# Patient Record
Sex: Female | Born: 1945
Health system: Southern US, Community
[De-identification: ages and names within clinical notes are randomized; demographics above are authoritative.]

## PROBLEM LIST (undated history)

## (undated) ENCOUNTER — Ambulatory Visit: Source: Home / Self Care

## (undated) DIAGNOSIS — I10 Essential (primary) hypertension: Secondary | ICD-10-CM

## (undated) DIAGNOSIS — E119 Type 2 diabetes mellitus without complications: Secondary | ICD-10-CM

## (undated) HISTORY — PX: ABDOMINAL HYSTERECTOMY: SHX81

## (undated) HISTORY — DX: Essential (primary) hypertension: I10

## (undated) HISTORY — DX: Type 2 diabetes mellitus without complications: E11.9

---

## 1998-04-12 ENCOUNTER — Other Ambulatory Visit: Admission: RE | Admit: 1998-04-12 | Discharge: 1998-04-12 | Payer: Self-pay | Admitting: Obstetrics & Gynecology

## 1999-08-19 ENCOUNTER — Other Ambulatory Visit: Admission: RE | Admit: 1999-08-19 | Discharge: 1999-08-19 | Payer: Self-pay | Admitting: Obstetrics & Gynecology

## 2000-11-05 ENCOUNTER — Other Ambulatory Visit: Admission: RE | Admit: 2000-11-05 | Discharge: 2000-11-05 | Payer: Self-pay | Admitting: Obstetrics & Gynecology

## 2001-12-21 ENCOUNTER — Other Ambulatory Visit: Admission: RE | Admit: 2001-12-21 | Discharge: 2001-12-21 | Payer: Self-pay | Admitting: Obstetrics & Gynecology

## 2003-01-09 ENCOUNTER — Other Ambulatory Visit: Admission: RE | Admit: 2003-01-09 | Discharge: 2003-01-09 | Payer: Self-pay | Admitting: Obstetrics & Gynecology

## 2005-03-17 ENCOUNTER — Encounter: Admission: RE | Admit: 2005-03-17 | Discharge: 2005-03-17 | Payer: Self-pay | Admitting: Family Medicine

## 2005-04-21 ENCOUNTER — Encounter: Admission: RE | Admit: 2005-04-21 | Discharge: 2005-07-20 | Payer: Self-pay | Admitting: Family Medicine

## 2010-06-18 ENCOUNTER — Encounter: Admission: RE | Admit: 2010-06-18 | Discharge: 2010-06-18 | Payer: Self-pay | Admitting: Obstetrics & Gynecology

## 2010-12-11 ENCOUNTER — Other Ambulatory Visit: Payer: Self-pay | Admitting: Obstetrics & Gynecology

## 2010-12-11 DIAGNOSIS — Z09 Encounter for follow-up examination after completed treatment for conditions other than malignant neoplasm: Secondary | ICD-10-CM

## 2010-12-16 ENCOUNTER — Ambulatory Visit
Admission: RE | Admit: 2010-12-16 | Discharge: 2010-12-16 | Disposition: A | Discharge status: Home / Self Care | Payer: BC Managed Care – PPO | Source: Ambulatory Visit | Attending: Obstetrics & Gynecology | Admitting: Obstetrics & Gynecology

## 2010-12-16 ENCOUNTER — Other Ambulatory Visit: Payer: Self-pay | Admitting: Obstetrics & Gynecology

## 2010-12-16 DIAGNOSIS — Z09 Encounter for follow-up examination after completed treatment for conditions other than malignant neoplasm: Secondary | ICD-10-CM

## 2011-06-24 ENCOUNTER — Other Ambulatory Visit: Payer: Self-pay | Admitting: Obstetrics & Gynecology

## 2011-06-24 DIAGNOSIS — N6489 Other specified disorders of breast: Secondary | ICD-10-CM

## 2011-07-08 ENCOUNTER — Ambulatory Visit
Admission: RE | Admit: 2011-07-08 | Discharge: 2011-07-08 | Disposition: A | Discharge status: Home / Self Care | Payer: BC Managed Care – PPO | Source: Ambulatory Visit | Attending: Obstetrics & Gynecology | Admitting: Obstetrics & Gynecology

## 2011-07-08 DIAGNOSIS — N6489 Other specified disorders of breast: Secondary | ICD-10-CM

## 2013-01-04 ENCOUNTER — Other Ambulatory Visit: Payer: Self-pay | Admitting: Family Medicine

## 2013-01-04 DIAGNOSIS — M25562 Pain in left knee: Secondary | ICD-10-CM

## 2013-01-04 DIAGNOSIS — R609 Edema, unspecified: Secondary | ICD-10-CM

## 2013-01-10 ENCOUNTER — Ambulatory Visit
Admission: RE | Admit: 2013-01-10 | Discharge: 2013-01-10 | Disposition: A | Discharge status: Home / Self Care | Payer: 59 | Source: Ambulatory Visit | Attending: Family Medicine | Admitting: Family Medicine

## 2013-01-10 DIAGNOSIS — M25562 Pain in left knee: Secondary | ICD-10-CM

## 2013-01-10 DIAGNOSIS — R609 Edema, unspecified: Secondary | ICD-10-CM

## 2013-04-26 HISTORY — PX: OTHER SURGICAL HISTORY: SHX169

## 2014-08-21 ENCOUNTER — Ambulatory Visit: Payer: Self-pay | Admitting: *Deleted

## 2014-09-11 ENCOUNTER — Encounter: Payer: Self-pay | Admitting: Dietician

## 2014-09-11 ENCOUNTER — Encounter: Payer: Medicare Other | Attending: Endocrinology | Admitting: Dietician

## 2014-09-11 VITALS — Ht 62.0 in | Wt 160.0 lb

## 2014-09-11 DIAGNOSIS — E1069 Type 1 diabetes mellitus with other specified complication: Secondary | ICD-10-CM | POA: Insufficient documentation

## 2014-09-11 DIAGNOSIS — Z713 Dietary counseling and surveillance: Secondary | ICD-10-CM | POA: Diagnosis not present

## 2014-09-11 DIAGNOSIS — E108 Type 1 diabetes mellitus with unspecified complications: Secondary | ICD-10-CM

## 2014-09-11 DIAGNOSIS — E1065 Type 1 diabetes mellitus with hyperglycemia: Secondary | ICD-10-CM

## 2014-09-11 DIAGNOSIS — IMO0002 Reserved for concepts with insufficient information to code with codable children: Secondary | ICD-10-CM

## 2014-09-11 NOTE — Patient Instructions (Signed)
Great job with changing your beverages! Increase exercise as tolerated. 6 x per week or 40 minutes each time.   Be mindful of choices at restaurant.  Be mindful of portions of carbohydrates (starches). Consider reading labels especially cereal.   Aim for 3 Carb Choices per meal (45 grams) +/- 1 either way  Aim for 0-1 Carbs per snack if hungry

## 2014-09-11 NOTE — Progress Notes (Signed)
  Medical Nutrition Therapy:  Appt start time: 1100 end time:  1200.   Assessment:  Primary concerns today: Patient would like to improve blood sugar control and lose weight.  States that she does not want to take insulin..  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  Patient does the shopping and cooking.  Weight overall has been stable for the past 10 years.    Tanida Scale:  41.8% body fat  Preferred Learning Style:   No preference indicated   Learning Readiness:   Ready  Change in progress   MEDICATIONS: see list, includes Amaryl and Glucophage   DIETARY INTAKE:  24-hr recall:  B ( AM): cereal (frosted flakes) and milk, 4 oz juice or boiled egg and fruit Snk ( AM): none L ( PM): out to eat with husband salad, salmon, soup Snk ( PM): cheese or crackers D ( PM): Out to eat 3-4 times per week. vege beef soup, crackers Snk ( PM): crackers Beverages: 2-3 coke per day was regular and changed to diet at the first of the year, juice, water, coffee with splenda  Usual physical activity: 30 minutes per day on bike and dance class for an hour once per week.  She increase this at the beginning of the year to help with blood sugar control.    Estimated energy needs: 1400 calories 158 g carbohydrates 105 g protein 39 g fat  Progress Towards Goal(s):  In progress.   Nutritional Diagnosis:  NB-1.1 Food and nutrition-related knowledge deficit As related to balance of carbohydrate, protein, and fat.  As evidenced by patient reprot..    Intervention:  Nutrition counseling and diabetes education initiated. Discussed Carb Counting by food group as method of portion control, reading food labels, and benefits of increased activity. Also discussed basic phy target BG ranges pre and post meals, and A1c.  Doristine Devoid job with changing your beverages! Increase exercise as tolerated. 6 x per week or 40 minutes each time.   Be mindful of choices at restaurant.  Be mindful of  portions of carbohydrates (starches). Consider reading labels especially cereal.   Aim for 3 Carb Choices per meal (45 grams) +/- 1 either way  Aim for 0-1 Carbs per snack if hungry    Teaching Method Utilized:  Visual Auditory Hands on  Handouts given during visit include: Food Label handouts Meal Plan Card My Plate HgbA1C sheet   Barriers to learning/adherence to lifestyle change: none  Demonstrated degree of understanding via:  Teach Back   Monitoring/Evaluation:  Dietary intake, exercise, label reading, and body weight in 6 week(s).

## 2014-10-30 ENCOUNTER — Encounter: Payer: Medicare Other | Attending: Endocrinology | Admitting: Dietician

## 2014-10-30 DIAGNOSIS — Z713 Dietary counseling and surveillance: Secondary | ICD-10-CM | POA: Insufficient documentation

## 2014-10-30 DIAGNOSIS — E1069 Type 1 diabetes mellitus with other specified complication: Secondary | ICD-10-CM | POA: Diagnosis present

## 2014-10-30 DIAGNOSIS — E119 Type 2 diabetes mellitus without complications: Secondary | ICD-10-CM

## 2014-10-30 NOTE — Progress Notes (Signed)
  Medical Nutrition Therapy:  Appt start time: 9179 end time:  1030.   Assessment:  09/11/14 Primary concerns today: Patient would like to improve blood sugar control and lose weight.  States that she does not want to take insulin..  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  Patient does the shopping and cooking.  Weight overall has been stable for the past 10 years.     TANITA  BODY COMP RESULTS 09/16/14 157.5 lbs   BMI (kg/m^2) 28.8   Fat Mass (lbs) 66   Fat Free Mass (lbs) 91.5   Total Body Water (lbs) 67     Assessment 10/30/14 Patient reports that she has decreased the sugar in her diet.  Rarely eats frosted flakes and has decreased the portion.  Has consistently used stationary bike for 40 minutes 6 days per week.  States that she needs further help with reducing portion size.  Weight is stable at 160 lbs but clothes fit better and others are telling patient and friends are asking if she has lost weight.  She is splitting restaurant portions with husband and requesting less when she orders.  CBG before bed (about 2 hours after dinner) is 140-159.  States that this has reduced from >200.  HgbA1C about 9% per patient.   Preferred Learning Style:   No preference indicated   Learning Readiness:   Ready  Change in progress   MEDICATIONS: see list, includes Amaryl and Glucophage   DIETARY INTAKE:  24-hr recall:  B ( AM): toast with coffee or boiled egg, grits, and toast, may have fruit Snk ( AM): none L ( PM): out to eat with husband salad, salmon, soup Snk ( PM): cheese or crackers D ( PM): Out to eat 3-4 times per week. vege beef soup, crackers Snk ( PM): fruit Beverages: diet coke but dislikes, water, coffee with splenda  Usual physical activity: 40 minutes per day on bike and dance class for an hour once per week.  She increased biking to 40 minutes and 6 days per week since last appointment.  Estimated energy needs: 1400 calories 158 g  carbohydrates 105 g protein 39 g fat  Progress Towards Goal(s):  In progress.   Nutritional Diagnosis:  NB-1.1 Food and nutrition-related knowledge deficit As related to balance of carbohydrate, protein, and fat.  As evidenced by patient reprot..    Intervention:  Nutrition counseling and diabetes education review.  Discussed importance of breakfast and need for protein with each snack and meal. Spoke further about eating out and portion control.    Great job on increasing your exercise and the changes to your beverages and food choices! Continue to exercise 6 x per week for 40 minutes. Continue to be mindful of choices and portion sizes at restaurant.   Have a protein with each meal and snack. Breakfast is important daily.  (Something small with protein and carbohydrate is fine.)  Aim for 3 Carb Choices per meal (45 grams) +/- 1 either way  Aim for 0-1 Carbs per snack if hungry    Teaching Method Utilized:  Visual Auditory Hands on  Handouts given during visit include: Meal Plan Card Snack sheet   Barriers to learning/adherence to lifestyle change: none  Demonstrated degree of understanding via:  Teach Back   Monitoring/Evaluation:  Dietary intake, exercise, label reading, and body weight 7-8 weeks.

## 2014-10-30 NOTE — Patient Instructions (Addendum)
Great job on increasing your exercise and the changes to your beverages and food choices! Continue to exercise 6 x per week for 40 minutes. Continue to be mindful of choices and portion sizes at restaurant.   Have a protein with each meal and snack. Breakfast is important daily.  (Something small with protein and carbohydrate is fine.)

## 2014-12-11 ENCOUNTER — Ambulatory Visit: Payer: Medicare Other | Admitting: Dietician

## 2015-01-15 ENCOUNTER — Encounter: Payer: Medicare Other | Attending: Endocrinology | Admitting: Dietician

## 2015-01-15 ENCOUNTER — Encounter: Payer: Self-pay | Admitting: Dietician

## 2015-01-15 DIAGNOSIS — Z713 Dietary counseling and surveillance: Secondary | ICD-10-CM | POA: Diagnosis not present

## 2015-01-15 DIAGNOSIS — E1069 Type 1 diabetes mellitus with other specified complication: Secondary | ICD-10-CM | POA: Insufficient documentation

## 2015-01-15 DIAGNOSIS — E119 Type 2 diabetes mellitus without complications: Secondary | ICD-10-CM

## 2015-01-15 NOTE — Progress Notes (Signed)
Medical Nutrition Therapy:  Appt start time: 2671 end time:  1030.   Assessment:  09/11/14 Primary concerns today: Patient would like to improve blood sugar control and lose weight.  States that she does not want to take insulin..  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  Patient does the shopping and cooking.  Weight overall has been stable for the past 10 years.     TANITA  BODY COMP RESULTS 09/16/14 157.5 lbs   BMI (kg/m^2) 28.8   Fat Mass (lbs) 66   Fat Free Mass (lbs) 91.5   Total Body Water (lbs) 67     Assessment 10/30/14 Patient reports that she has decreased the sugar in her diet.  Rarely eats frosted flakes and has decreased the portion.  Has consistently used stationary bike for 40 minutes 6 days per week.  States that she needs further help with reducing portion size.  Weight is stable at 160 lbs but clothes fit better and others are telling patient and friends are asking if she has lost weight.  She is splitting restaurant portions with husband and requesting less when she orders.  CBG before bed (about 2 hours after dinner) is 140-159.  States that this has reduced from >200.  HgbA1C about 9% per patient.   01/15/15: Patient reports that she has been traveling a lot and eating more as well as not exercising as consistently.  Body composition results are the same as February but per patient her HgbA1C has decreased from 9% to 7%.    Preferred Learning Style:   No preference indicated   Learning Readiness:   Ready  Change in progress   MEDICATIONS: see list, includes Amaryl and Glucophage   DIETARY INTAKE:  24-hr recall:  B ( AM): toast with coffee or boiled egg, grits, and toast, may have fruit Snk ( AM): none L ( PM): out to eat with husband salad, salmon, soup Snk ( PM): cheese or crackers D ( PM): Out to eat 3-4 times per week. vege beef soup, crackers Snk ( PM): fruit Beverages: diet coke but dislikes, water, coffee with splenda  Usual  physical activity: 40 minutes per day on bike and dance class for an hour once per week.  Less consistent since her last visit.  Estimated energy needs: 1400 calories 158 g carbohydrates 105 g protein 39 g fat  Progress Towards Goal(s):  In progress.   Nutritional Diagnosis:  NB-1.1 Food and nutrition-related knowledge deficit As related to balance of carbohydrate, protein, and fat.  As evidenced by patient reprot..    Intervention:  Nutrition counseling and diabetes education review.  Discussed importance of breakfast and need for protein with each snack and meal. Spoke further about eating out and portion control.  Discussed importance of mindfulness with eating.  Book Intuitive Eating discussed as book for patient to read.  Motivation discussed.  -To increase exercise while traveling:  Plan trip to a museum or sight see.   Great job on increasing your exercise and the changes to your beverages and food choices! Continue to exercise 6 x per week for 40 minutes. Continue to be mindful of choices and portion sizes at restaurant.   Have a protein with each meal and snack. Breakfast is important daily.  (Something small with protein and carbohydrate is fine.)  Aim for 3 Carb Choices per meal (45 grams) +/- 1 either way  Aim for 0-1 Carbs per snack if hungry   Calorie [employer] Pace app or  look on line to be informed about what you are choosing when you are eating out. Continue to be mindful of food choices and hunger signals.   Protein with each meal.  Teaching Method Utilized:  Visual Auditory Hands on  Handouts given during visit include: Meal Plan Card Snack sheet   Barriers to learning/adherence to lifestyle change: none  Demonstrated degree of understanding via:  Teach Back   Monitoring/Evaluation:  Dietary intake, exercise, label reading, and body weight 7-8 weeks.

## 2015-01-15 NOTE — Patient Instructions (Signed)
Calorie [employer] app or look on line to be informed about what you are choosing when you are eating out. Continue to be mindful of food choices and hunger signals.   Protein with each meal.

## 2015-04-16 ENCOUNTER — Encounter: Payer: Self-pay | Admitting: Dietician

## 2015-04-16 ENCOUNTER — Encounter: Payer: Medicare Other | Attending: Obstetrics & Gynecology | Admitting: Dietician

## 2015-04-16 VITALS — Wt 159.0 lb

## 2015-04-16 DIAGNOSIS — E119 Type 2 diabetes mellitus without complications: Secondary | ICD-10-CM | POA: Diagnosis not present

## 2015-04-16 DIAGNOSIS — Z713 Dietary counseling and surveillance: Secondary | ICD-10-CM | POA: Insufficient documentation

## 2015-04-16 NOTE — Patient Instructions (Signed)
Consider putting Calorie [employer] Pace on your phone/i-pad to help make informed decisions when eating out.   Get back into your exercise habit.  40 minutes most days of the week.   Pay attention to your hunger and fullness. Whole grains are preferred Sugar free beverages only.  Do not drink carbohydrates.

## 2015-04-16 NOTE — Progress Notes (Signed)
Medical Nutrition Therapy:  Appt start time: 1030 end time:  1100.  Assessment:  09/11/14 Primary concerns today: Patient would like to improve blood sugar control and lose weight.  States that she does not want to take insulin..  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  Patient does the shopping and cooking.  Weight overall has been stable for the past 10 years.    TANITA  BODY COMP RESULTS 09/16/14 157.5 lbs   BMI (kg/m^2) 28.8   Fat Mass (lbs) 66   Fat Free Mass (lbs) 91.5   Total Body Water (lbs) 67    Assessment 10/30/14: Patient reports that she has decreased the sugar in her diet.  Rarely eats frosted flakes and has decreased the portion.  Has consistently used stationary bike for 40 minutes 6 days per week.  States that she needs further help with reducing portion size.  Weight is stable at 160 lbs but clothes fit better and others are telling patient and friends are asking if she has lost weight.  She is splitting restaurant portions with husband and requesting less when she orders.  CBG before bed (about 2 hours after dinner) is 140-159.  States that this has reduced from >200.  HgbA1C about 9% per patient.  01/15/15: Patient reports that she has been traveling a lot and eating more as well as not exercising as consistently.  Body composition results are the same as February but per patient her HgbA1C has decreased from 9% to 7%.    TANITA  BODY COMP RESULTS 01/15/15 157.5   BMI (kg/m^2) 28.8   Fat Mass (lbs) 66   Fat Free Mass (lbs) 91.5   Total Body Water (lbs) 67   04/16/15: Patient is here alone for follow up for weight loss.  She states that she has been traveling a lot and not exercising.  She has gone back to the sweet tea and regular coke often.  Fat mass has increased.  TANITA  BODY COMP RESULTS 04/16/15 159 lbs   BMI (kg/m^2) 29.1   Fat Mass (lbs) 70   Fat Free Mass (lbs) 89   Total Body Water (lbs) 65   Preferred Learning Style:   No preference  indicated   Learning Readiness:   Ready  Change in progress  MEDICATIONS: see list, includes Amaryl and Glucophage   DIETARY INTAKE:  24-hr recall:  B ( AM): toast with coffee or boiled egg, grits, and toast, may have fruit- skipped this am.  May have 1 slice bacon. Snk ( AM): none L ( PM): out to eat with husband salad, salmon, soup OR fruit and pork chop sandwich on white and fruit Snk ( PM): cheese or crackers or pretzels D ( PM): Out to eat 3-4 times per week. vege beef soup, crackers Snk ( PM): fruit Beverages: regular or diet coke, sweet tea, dislikes, water, coffee with splenda  Usual physical activity: 40 minutes per day on bike and dance class for an hour once per week.  None since last visit.  Estimated energy needs: 1400 calories 158 g carbohydrates 105 g protein 39 g fat  Progress Towards Goal(s):  In progress.   Nutritional Diagnosis:  NB-1.1 Food and nutrition-related knowledge deficit As related to balance of carbohydrate, protein, and fat.  As evidenced by patient reprot..    Intervention:  Nutrition counseling and diabetes education review.  Discussed importance of breakfast and need for protein with each snack and meal. Spoke further about eating out  and portion control.  Discussed importance of mindfulness with eating.  Discussed importance of getting back on track for Diabetes control.   Consider putting Calorie [employer] Pace on your phone/i-pad to help make informed decisions when eating out.   Get back into your exercise habit.  40 minutes most days of the week.   Pay attention to your hunger and fullness. Whole grains are preferred Sugar free beverages only.  Do not drink carbohydrates.  Aim for 3 Carb Choices per meal (45 grams) +/- 1 either way  Aim for 0-1 Carbs per snack if hungry   Teaching Method Utilized:  Visual Auditory  Handouts given during visit include: Dining out with diabetes Tips for healthier fast food choices. Snack  sheet   Barriers to learning/adherence to lifestyle change: none  Demonstrated degree of understanding via:  Teach Back   Monitoring/Evaluation:  Dietary intake, exercise, label reading, and body weight 7-8 weeks.

## 2015-05-28 ENCOUNTER — Ambulatory Visit: Payer: Medicare Other | Admitting: Dietician

## 2015-06-17 ENCOUNTER — Ambulatory Visit: Payer: Medicare Other | Admitting: Dietician

## 2015-07-01 ENCOUNTER — Encounter: Payer: Medicare Other | Attending: Obstetrics & Gynecology | Admitting: Dietician

## 2015-07-01 DIAGNOSIS — Z713 Dietary counseling and surveillance: Secondary | ICD-10-CM | POA: Diagnosis not present

## 2015-07-01 DIAGNOSIS — E119 Type 2 diabetes mellitus without complications: Secondary | ICD-10-CM | POA: Diagnosis present

## 2015-07-01 NOTE — Progress Notes (Signed)
Medical Nutrition Therapy:  Appt start time: 1030 end time:  1100.  Assessment:  09/11/14 Primary concerns today: Patient would like to improve blood sugar control and lose weight.  States that she does not want to take insulin..  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  Patient does the shopping and cooking.  Weight overall has been stable for the past 10 years.    TANITA  BODY COMP RESULTS 09/16/14 157.5 lbs   BMI (kg/m^2) 28.8   Fat Mass (lbs) 66   Fat Free Mass (lbs) 91.5   Total Body Water (lbs) 67    Assessment 10/30/14: Patient reports that she has decreased the sugar in her diet.  Rarely eats frosted flakes and has decreased the portion.  Has consistently used stationary bike for 40 minutes 6 days per week.  States that she needs further help with reducing portion size.  Weight is stable at 160 lbs but clothes fit better and others are telling patient and friends are asking if she has lost weight.  She is splitting restaurant portions with husband and requesting less when she orders.  CBG before bed (about 2 hours after dinner) is 140-159.  States that this has reduced from >200.  HgbA1C about 9% per patient.  01/15/15: Patient reports that she has been traveling a lot and eating more as well as not exercising as consistently.  Body composition results are the same as February but per patient her HgbA1C has decreased from 9% to 7%.    TANITA  BODY COMP RESULTS 01/15/15 157.5   BMI (kg/m^2) 28.8   Fat Mass (lbs) 66   Fat Free Mass (lbs) 91.5   Total Body Water (lbs) 67   04/16/15: Patient is here alone for follow up for weight loss.  She states that she has been traveling a lot and not exercising.  She has gone back to the sweet tea and regular coke often.  Fat mass has increased.  TANITA  BODY COMP RESULTS 04/16/15 159 lbs   BMI (kg/m^2) 29.1   Fat Mass (lbs) 70   Fat Free Mass (lbs) 89   Total Body Water (lbs) 65   07/01/15: Patient is here alone for follow up  for weight loss.  She is not feeling well today and states that her fasting blood sugar is 130-160.  She has been exercising inconsistently.  She is mindful of her eating habits at times.  Overall weight is stable but fat mass has decreased 2.5 lbs since last visit.    TANITA  BODY COMP RESULTS 07/01/15 159.5   BMI (kg/m^2) 29.2   Fat Mass (lbs) 67.5   Fat Free Mass (lbs) 92   Total Body Water (lbs) 67.5    Preferred Learning Style:   No preference indicated   Learning Readiness:   Ready  Change in progress  MEDICATIONS: see list, includes Amaryl and Glucophage   DIETARY INTAKE:  24-hr recall:  B ( AM): toast with coffee or boiled egg, grits, and toast, may have fruit- skipped this am.  May have 1 slice bacon. Snk ( AM): none L ( PM): out to eat with husband salad, salmon, soup OR fruit and pork chop sandwich on white and fruit Snk ( PM): cheese or crackers or pretzels D ( PM): Out to eat 3-4 times per week. vege beef soup, crackers Snk ( PM): fruit Beverages: regular or diet coke, sweet tea, dislikes, water, coffee with splenda  Usual physical activity: 40 minutes  per day on bike and dance class for an hour once per week.  None since last visit.  Estimated energy needs: 1400 calories 158 g carbohydrates 105 g protein 39 g fat  Progress Towards Goal(s):  In progress.   Nutritional Diagnosis:  NB-1.1 Food and nutrition-related knowledge deficit As related to balance of carbohydrate, protein, and fat.  As evidenced by patient reprot..    Intervention:  Nutrition counseling and diabetes education review.  Continued focus on mindful eating and resuming exercising habit.  Discussed reducing or avoiding juice intake.  Plan:   Continue to be mindful about your food choices.  Stop eating when you are full Continue to exercise most days of the week.   Aim for 3 Carb Choices per meal (45 grams) +/- 1 either way  Aim for 0-1 Carbs per snack if hungry   Teaching Method  Utilized:  Visual Auditory  Handouts given during visit include: Dining out with diabetes Tips for healthier fast food choices. Snack sheet   Barriers to learning/adherence to lifestyle change: none  Demonstrated degree of understanding via:  Teach Back   Monitoring/Evaluation:  Dietary intake, exercise, label reading, and body weight 7-8 weeks.

## 2015-07-01 NOTE — Patient Instructions (Signed)
Continue to be mindful about your food choices.  Stop eating when you are full. Continue to exercise most days of the week.

## 2015-08-26 ENCOUNTER — Encounter: Payer: Medicare Other | Attending: Obstetrics & Gynecology | Admitting: Dietician

## 2015-08-26 ENCOUNTER — Encounter: Payer: Self-pay | Admitting: Dietician

## 2015-08-26 VITALS — Wt 157.0 lb

## 2015-08-26 DIAGNOSIS — Z713 Dietary counseling and surveillance: Secondary | ICD-10-CM | POA: Diagnosis not present

## 2015-08-26 DIAGNOSIS — E119 Type 2 diabetes mellitus without complications: Secondary | ICD-10-CM | POA: Insufficient documentation

## 2015-08-26 DIAGNOSIS — Z6828 Body mass index (BMI) 28.0-28.9, adult: Secondary | ICD-10-CM

## 2015-08-26 NOTE — Progress Notes (Signed)
Medical Nutrition Therapy:  Appt start time: [id number] end time:  1030.  Assessment:  09/11/14 Primary concerns today: Patient would like to improve blood sugar control and lose weight.  States that she does not want to take insulin..  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  Patient does the shopping and cooking.  Weight overall has been stable for the past 10 years.    TANITA  BODY COMP RESULTS 09/16/14 157.5 lbs   BMI (kg/m^2) 28.8   Fat Mass (lbs) 66   Fat Free Mass (lbs) 91.5   Total Body Water (lbs) 67    Assessment 10/30/14: Patient reports that she has decreased the sugar in her diet.  Rarely eats frosted flakes and has decreased the portion.  Has consistently used stationary bike for 40 minutes 6 days per week.  States that she needs further help with reducing portion size.  Weight is stable at 160 lbs but clothes fit better and others are telling patient and friends are asking if she has lost weight.  She is splitting restaurant portions with husband and requesting less when she orders.  CBG before bed (about 2 hours after dinner) is 140-159.  States that this has reduced from >200.  HgbA1C about 9% per patient.  01/15/15: Patient reports that she has been traveling a lot and eating more as well as not exercising as consistently.  Body composition results are the same as February but per patient her HgbA1C has decreased from 9% to 7%.    TANITA  BODY COMP RESULTS 01/15/15 157.5   BMI (kg/m^2) 28.8   Fat Mass (lbs) 66   Fat Free Mass (lbs) 91.5   Total Body Water (lbs) 67   04/16/15: Patient is here alone for follow up for weight loss.  She states that she has been traveling a lot and not exercising.  She has gone back to the sweet tea and regular coke often.  Fat mass has increased.  TANITA  BODY COMP RESULTS 04/16/15 159 lbs   BMI (kg/m^2) 29.1   Fat Mass (lbs) 70   Fat Free Mass (lbs) 89   Total Body Water (lbs) 65   07/01/15: Patient is here alone for follow up  for weight loss.  She is not feeling well today and states that her fasting blood sugar is 130-160.  She has been exercising inconsistently.  She is mindful of her eating habits at times.  Overall weight is stable but fat mass has decreased 2.5 lbs since last visit.    TANITA  BODY COMP RESULTS 07/01/15 159.5   BMI (kg/m^2) 29.2   Fat Mass (lbs) 67.5   Fat Free Mass (lbs) 92   Total Body Water (lbs) 67.5   08/27/15: Patient is here alone for follow up for weight loss.  She has lost 2.5 lbs of fat mass. She has not exercised consistently and wants to resume this habit.  Checking her blood sugar periodically 135-152 before meals or 182 before bed.  TANITA  BODY COMP RESULTS 08/27/15 157   BMI (kg/m^2) 28.7   Fat Mass (lbs) 65   Fat Free Mass (lbs) 92   Total Body Water (lbs) 67.5    Preferred Learning Style:   No preference indicated   Learning Readiness:   Ready  Change in progress  MEDICATIONS: see list, includes Amaryl and Glucophage   DIETARY INTAKE:  24-hr recall:  B ( AM): toast with coffee or boiled egg, grits, and toast, may have  fruit- skipped this am.  May have 1 slice bacon. Snk ( AM): none L ( PM): out to eat with husband salad, salmon, soup OR fruit and pork chop sandwich on white and fruit Snk ( PM): cheese or crackers or pretzels D ( PM): Out to eat 3-4 times per week. vege beef soup, crackers Snk ( PM): fruit Beverages: regular or diet coke, sweet tea, dislikes, water, coffee with splenda  Usual physical activity: 40 minutes per day on bike and dance class for an hour once per week.  None since last visit.  Estimated energy needs: 1400 calories 158 g carbohydrates 105 g protein 39 g fat  Progress Towards Goal(s):  In progress.   Nutritional Diagnosis:  NB-1.1 Food and nutrition-related knowledge deficit As related to balance of carbohydrate, protein, and fat.  As evidenced by patient reprot..    Intervention:  Nutrition counseling and diabetes  education review.  Continued focus on mindful eating and resuming exercising habit.  Discussed when checking blood sugar after meals this should be 2 hours after the start of the meals.  If high, drink water and analyze why it was high.  Get back into your exercise habit. Breakfast, lunch, dinner daily. Continue to eat mindfully. Consider Calorie [employer] on phone.  Aim for 3 Carb Choices per meal (45 grams) +/- 1 either way  Aim for 0-1 Carbs per snack if hungry   Teaching Method Utilized:  Visual Auditory  Handouts given during visit include: Dining out with diabetes Tips for healthier fast food choices. Snack sheet   Barriers to learning/adherence to lifestyle change: none  Demonstrated degree of understanding via:  Teach Back   Monitoring/Evaluation:  Dietary intake, exercise, label reading, and body weight 7-8 weeks.

## 2015-08-26 NOTE — Patient Instructions (Addendum)
Get back into your exercise habit. Breakfast, lunch, dinner daily. Continue to eat mindfully. Consider Calorie [employer] on phone.

## 2015-10-21 ENCOUNTER — Ambulatory Visit: Payer: 59 | Admitting: Dietician

## 2015-11-11 ENCOUNTER — Ambulatory Visit: Payer: Medicare Other | Admitting: Dietician

## 2015-11-12 ENCOUNTER — Encounter: Payer: Self-pay | Admitting: Dietician

## 2015-11-12 ENCOUNTER — Encounter: Payer: Medicare Other | Attending: Family Medicine | Admitting: Dietician

## 2015-11-12 VITALS — Wt 162.0 lb

## 2015-11-12 DIAGNOSIS — IMO0002 Reserved for concepts with insufficient information to code with codable children: Secondary | ICD-10-CM

## 2015-11-12 DIAGNOSIS — Z029 Encounter for administrative examinations, unspecified: Secondary | ICD-10-CM | POA: Diagnosis not present

## 2015-11-12 DIAGNOSIS — E118 Type 2 diabetes mellitus with unspecified complications: Secondary | ICD-10-CM

## 2015-11-12 DIAGNOSIS — E1165 Type 2 diabetes mellitus with hyperglycemia: Secondary | ICD-10-CM

## 2015-11-12 NOTE — Progress Notes (Signed)
Medical Nutrition Therapy:  Appt start time: [id number] end time:  1030.  Assessment:  09/11/14 Primary concerns today: Patient would like to improve blood sugar control and lose weight.  States that she does not want to take insulin..  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  Patient does the shopping and cooking.  Weight overall has been stable for the past 10 years.    TANITA  BODY COMP RESULTS 09/16/14 157.5 lbs   BMI (kg/m^2) 28.8   Fat Mass (lbs) 66   Fat Free Mass (lbs) 91.5   Total Body Water (lbs) 67    Assessment 10/30/14: Patient reports that she has decreased the sugar in her diet.  Rarely eats frosted flakes and has decreased the portion.  Has consistently used stationary bike for 40 minutes 6 days per week.  States that she needs further help with reducing portion size.  Weight is stable at 160 lbs but clothes fit better and others are telling patient and friends are asking if she has lost weight.  She is splitting restaurant portions with husband and requesting less when she orders.  CBG before bed (about 2 hours after dinner) is 140-159.  States that this has reduced from >200.  HgbA1C about 9% per patient.  01/15/15: Patient reports that she has been traveling a lot and eating more as well as not exercising as consistently.  Body composition results are the same as February but per patient her HgbA1C has decreased from 9% to 7%.    TANITA  BODY COMP RESULTS 01/15/15 157.5   BMI (kg/m^2) 28.8   Fat Mass (lbs) 66   Fat Free Mass (lbs) 91.5   Total Body Water (lbs) 67   04/16/15: Patient is here alone for follow up for weight loss.  She states that she has been traveling a lot and not exercising.  She has gone back to the sweet tea and regular coke often.  Fat mass has increased.  TANITA  BODY COMP RESULTS 04/16/15 159 lbs   BMI (kg/m^2) 29.1   Fat Mass (lbs) 70   Fat Free Mass (lbs) 89   Total Body Water (lbs) 65   07/01/15: Patient is here alone for follow up  for weight loss.  She is not feeling well today and states that her fasting blood sugar is 130-160.  She has been exercising inconsistently.  She is mindful of her eating habits at times.  Overall weight is stable but fat mass has decreased 2.5 lbs since last visit.    TANITA  BODY COMP RESULTS 07/01/15 159.5   BMI (kg/m^2) 29.2   Fat Mass (lbs) 67.5   Fat Free Mass (lbs) 92   Total Body Water (lbs) 67.5   08/27/15: Patient is here alone for follow up for weight loss.  She has lost 2.5 lbs of fat mass. She has not exercised consistently and wants to resume this habit.  Checking her blood sugar periodically 135-152 before meals or 182 before bed.  TANITA  BODY COMP RESULTS 08/27/15 157   BMI (kg/m^2) 28.7   Fat Mass (lbs) 65   Fat Free Mass (lbs) 92   Total Body Water (lbs) 67.5   11/12/15: Patient is here alone for continued follow up.  She has gained water and fat mass.  HgbA1C increased to 8.5% in March.  This has come back up.  She has been traveling a lot more and does not exercise when she travels.  TANITA  BODY COMP  RESULTS 11/12/15 162 lbs   BMI (kg/m^2) 29.6   Fat Mass (lbs) 67   Fat Free Mass (lbs) 95   Total Body Water (lbs) 69.5     Preferred Learning Style:   No preference indicated   Learning Readiness:   Ready  Change in progress  MEDICATIONS: see list, includes Amaryl and Glucophage   DIETARY INTAKE:  24-hr recall:  B ( AM): toast with coffee or boiled egg, grits, and toast, may have fruit- skipped this am.  May have 1 slice bacon. Snk ( AM): none L ( PM): out to eat with husband salad, salmon, soup OR fruit and pork chop sandwich on white and fruit Snk ( PM): cheese or crackers or pretzels D ( PM): Out to eat 3-4 times per week. vege beef soup, crackers Snk ( PM): fruit Beverages: regular or diet coke, sweet tea, dislikes, water, coffee with splenda  Usual physical activity: 40 minutes per day on bike and dance class for an hour once per week.  None  since last visit.  Estimated energy needs: 1400 calories 158 g carbohydrates 105 g protein 39 g fat  Progress Towards Goal(s):  In progress.   Nutritional Diagnosis:  NB-1.1 Food and nutrition-related knowledge deficit As related to balance of carbohydrate, protein, and fat.  As evidenced by patient reprot..    Intervention:  Nutrition counseling and diabetes education review.  Continued focus on mindful eating and continue the exercise habit.  Discussed eating out.  Patient discussed need to drink more water and stop her Cocoa Cola habit.  Rethink your drinks.  More water with lemon rather than caffeinated beverages/coke. Drink more fluid in the morning to avoid waking. Continue the exercise.  Consider adding light weights 2-3 times per week.  Aim for some form of exercise most days of the week. Continue to be mindful about your food choices. Eat slowly.  Stop when you are full!  Aim for 3 Carb Choices per meal (45 grams) +/- 1 either way  Aim for 0-1 Carbs per snack if hungry   Teaching Method Utilized:  Visual Auditory  Handouts given during visit include: Dining out with diabetes Tips for healthier fast food choices. Snack sheet  Barriers to learning/adherence to lifestyle change: none  Demonstrated degree of understanding via:  Teach Back   Monitoring/Evaluation:  Dietary intake, exercise, label reading, and body weight 7-8 weeks.

## 2015-11-12 NOTE — Patient Instructions (Addendum)
Rethink your drinks.  More water with lemon rather than caffeinated beverages/coke. Drink more fluid in the morning to avoid waking. Continue the exercise.  Consider adding light weights 2-3 times per week.  Aim for some form of exercise most days of the week. Continue to be mindful about your food choices. Eat slowly.  Stop when you are full!

## 2016-01-14 ENCOUNTER — Ambulatory Visit: Payer: 59 | Admitting: Dietician

## 2016-04-28 ENCOUNTER — Encounter: Payer: Self-pay | Admitting: Internal Medicine

## 2016-04-28 ENCOUNTER — Ambulatory Visit (INDEPENDENT_AMBULATORY_CARE_PROVIDER_SITE_OTHER): Payer: Medicare Other | Admitting: Internal Medicine

## 2016-04-28 VITALS — BP 132/82 | HR 91 | Ht 62.0 in | Wt 159.0 lb

## 2016-04-28 DIAGNOSIS — E1129 Type 2 diabetes mellitus with other diabetic kidney complication: Secondary | ICD-10-CM

## 2016-04-28 DIAGNOSIS — R809 Proteinuria, unspecified: Secondary | ICD-10-CM | POA: Diagnosis not present

## 2016-04-28 DIAGNOSIS — Z23 Encounter for immunization: Secondary | ICD-10-CM

## 2016-04-28 DIAGNOSIS — I10 Essential (primary) hypertension: Secondary | ICD-10-CM | POA: Insufficient documentation

## 2016-04-28 LAB — POCT GLYCOSYLATED HEMOGLOBIN (HGB A1C): Hemoglobin A1C: 9.5

## 2016-04-28 MED ORDER — DULAGLUTIDE 0.75 MG/0.5ML ~~LOC~~ SOAJ: SUBCUTANEOUS | 1 refills | Status: DC

## 2016-04-28 MED ORDER — GLIMEPIRIDE 4 MG PO TABS: ORAL_TABLET | ORAL | 3 refills | Status: DC

## 2016-04-28 MED ORDER — METFORMIN HCL ER 500 MG PO TB24: 1000.0000 mg | ORAL_TABLET | Freq: Two times a day (BID) | ORAL | 3 refills | Status: DC

## 2016-04-28 NOTE — Patient Instructions (Addendum)
Please switch to Metformin ER 1000 mg 2x a day.  Increase Glimepiride to 4 mg before breakfast and add 2 mg before dinner.  Please add Trulicity [id number] mg weekly.  Please call me when you are close to running out of Trulicity to send a prescription for the higher dose to your pharmacy.  Please return in 1.5 months with your sugar log.   PATIENT INSTRUCTIONS FOR TYPE 2 DIABETES:  **Please join MyChart!** - see attached instructions about how to join if you have not done so already.  DIET AND EXERCISE Diet and exercise is an important part of diabetic treatment.  We recommended aerobic exercise in the form of brisk walking (working between 40-60% of maximal aerobic capacity, similar to brisk walking) for 150 minutes per week (such as 30 minutes five days per week) along with 3 times per week performing 'resistance' training (using various gauge rubber tubes with handles) 5-10 exercises involving the major muscle groups (upper body, lower body and core) performing 10-15 repetitions (or near fatigue) each exercise. Start at half the above goal but build slowly to reach the above goals. If limited by weight, joint pain, or disability, we recommend daily walking in a swimming pool with water up to waist to reduce pressure from joints while allow for adequate exercise.    BLOOD GLUCOSES Monitoring your blood glucoses is important for continued management of your diabetes. Please check your blood glucoses 2-4 times a day: fasting, before meals and at bedtime (you can rotate these measurements - e.g. one day check before the 3 meals, the next day check before 2 of the meals and before bedtime, etc.).   HYPOGLYCEMIA (low blood sugar) Hypoglycemia is usually a reaction to not eating, exercising, or taking too much insulin/ other diabetes drugs.  Symptoms include tremors, sweating, hunger, confusion, headache, etc. Treat IMMEDIATELY with 15 grams of Carbs: . 4 glucose tablets .  cup regular  juice/soda . 2 tablespoons raisins . 4 teaspoons sugar . 1 tablespoon honey Recheck blood glucose in 15 mins and repeat above if still symptomatic/blood glucose <100.  RECOMMENDATIONS TO REDUCE YOUR RISK OF DIABETIC COMPLICATIONS: * Take your prescribed MEDICATION(S) * Follow a DIABETIC diet: Complex carbs, fiber rich foods, (monounsaturated and polyunsaturated) fats * AVOID saturated/trans fats, high fat foods, >2,300 mg salt per day. * EXERCISE at least 5 times a week for 30 minutes or preferably daily.  * DO NOT SMOKE OR DRINK more than 1 drink a day. * Check your FEET every day. Do not wear tightfitting shoes. Contact us if you develop an ulcer * See your EYE doctor once a year or more if needed * Get a FLU shot once a year * Get a PNEUMONIA vaccine once before and once after age 27 years  GOALS:  * Your Hemoglobin A1c of <7%  * fasting sugars need to be <130 * after meals sugars need to be <180 (2h after you start eating) * Your Systolic BP should be [id number] or lower  * Your Diastolic BP should be 80 or lower  * Your HDL (Good Cholesterol) should be 40 or higher  * Your LDL (Bad Cholesterol) should be 100 or lower. * Your Triglycerides should be 150 or lower  * Your Urine microalbumin (kidney function) should be <30 * Your Body Mass Index should be 25 or lower    Please consider the following ways to cut down carbs and fat and increase fiber and micronutrients in your diet: - substitute whole grain  for white bread or pasta - substitute brown rice for white rice - substitute 90-calorie flat bread pieces for slices of bread when possible - substitute sweet potatoes or yams for white potatoes - substitute humus for margarine - substitute tofu for cheese when possible - substitute almond or rice milk for regular milk (would not drink soy milk daily due to concern for soy estrogen influence on breast cancer risk) - substitute dark chocolate for other sweets when possible -  substitute water - can add lemon or orange slices for taste - for diet sodas (artificial sweeteners will trick your body that you can eat sweets without getting calories and will lead you to overeating and weight gain in the long run) - do not skip breakfast or other meals (this will slow down the metabolism and will result in more weight gain over time)  - can try smoothies made from fruit and almond/rice milk in am instead of regular breakfast - can also try old-fashioned (not instant) oatmeal made with almond/rice milk in am - order the dressing on the side when eating salad at a restaurant (pour less than half of the dressing on the salad) - eat as little meat as possible - can try juicing, but should not forget that juicing will get rid of the fiber, so would alternate with eating raw veg./fruits or drinking smoothies - use as little oil as possible, even when using olive oil - can dress a salad with a mix of balsamic vinegar and lemon juice, for e.g. - use agave nectar, stevia sugar, or regular sugar rather than artificial sweateners - steam or broil/roast veggies  - snack on veggies/fruit/nuts (unsalted, preferably) when possible, rather than processed foods - reduce or eliminate aspartame in diet (it is in diet sodas, chewing gum, etc) Read the labels!  Try to read Dr. Janene Harvey book: "Program for Reversing Diabetes" for other ideas for healthy eating.

## 2016-04-28 NOTE — Progress Notes (Signed)
Patient ID: Ann [name], female   DOB: 09/01/1945, 70 y.o.   MRN: [id number]   HPI: Ann [name] is a 70 y.o.-year-old female, referred by her PCP, Dr. Orland Mustard, for management of DM2, dx in 2006, non-insulin-dependent, uncontrolled, with complications (microalbuminuria). She saw endocrinology previously.  Last hemoglobin A1c was: 02/25/2016: 11.2% Previously 9.7% Previously 8.9%  Pt is on a regimen of: - Amaryl 2 mg in a.m.  - Metformin 1000 mg 2x a day >> but this is causing occasional diarrhea >> needs to miss doses She refused insulin.  Pt checks her sugars 2-3x a week: - am: 122, 155-182, 212 (URI) - 2h after b'fast: n/c - before lunch: n/c - 2h after lunch: n/c - before dinner: 180-190, 200s - 2h after dinner: n/c - bedtime: n/c - nighttime: n/c No lows. Lowest sugar was 122; she has hypoglycemia awareness at 70.  Highest sugar was 250.  Glucometer: Freestyle Lite  Pt's meals are: - Breakfast: cereal, boiled egg - Lunch: sandwich, salad - Dinner: meat + salad, potato - Snacks: 3  She saw nutrition in the past.  - no CKD, last BUN/creatinine:  02/25/2016: BUN/creatinine 16/0.92, EGFR 60/73. At that time, glucose was 259, with a sodium of 133.   05/28/2015: Urine ACR 45.6 - last set of lipids: 05/28/2015: 170/173/34/102 - last eye exam was in 09/2015. No DR. + macular degeneration. - no numbness and tingling in her feet.  Pt has FH of DM in mother, sisters.  She also has a h/o HTN.  ROS: Constitutional: no weight gain/loss, no fatigue, + hot flushes, + nocturia Eyes: no blurry vision, no xerophthalmia ENT: no sore throat, no nodules palpated in throat, no dysphagia/odynophagia, no hoarseness Cardiovascular: no CP/SOB/palpitations/leg swelling Respiratory: no cough/SOB Gastrointestinal: no N/V/D/C Musculoskeletal: no muscle/joint aches Skin: no rashes Neurological: no tremors/numbness/tingling/dizziness Psychiatric: no depression/anxiety  Past  Medical History:  Diagnosis Date  . Diabetes mellitus without complication ([place])   . Hypertension    Past Surgical History:  Procedure Laterality Date  . ABDOMINAL HYSTERECTOMY    . knee surgery  04/2013   Social History   Social History  . Marital status: Married    Spouse name: N/A  . Number of children: 3   Occupational History  . Retired Tourist information centre manager   Social History Main Topics  . Smoking status: Never Smoker  . Smokeless tobacco: No  . Alcohol use No  . Drug use: no   Current Outpatient Prescriptions on File Prior to Visit  Medication Sig Dispense Refill  . amLODipine (NORVASC) 10 MG tablet Take 10 mg by mouth daily. Unknown dose    . atenolol (TENORMIN) 100 MG tablet Take 100 mg by mouth daily. Unknown dose    . estradiol (VIVELLE-DOT) 0.025 MG/24HR Place 1 patch onto the skin 2 (two) times a week. Unknown dose    . fluticasone (FLONASE) 50 MCG/ACT nasal spray Place 2 sprays into both nostrils daily.    . montelukast (SINGULAIR) 10 MG tablet Take 10 mg by mouth at bedtime. Unknown dose    . lisinopril (PRINIVIL,ZESTRIL) 10 MG tablet Take 10 mg by mouth daily. Unknown dose     No current facility-administered medications on file prior to visit.    No Known Allergies   FH: - Diabetes in mother, sister, brother - Hypertension in mother and sister - Hyperlipidemia in brother  PE: BP 132/82 (BP Location: Left Arm, Patient Position: Sitting)   Pulse 91   Ht 5' 2"  (1.575 m)  Wt 159 lb (72.1 kg)   SpO2 95%   BMI 29.08 kg/m  Wt Readings from Last 3 Encounters:  04/28/16 159 lb (72.1 kg)  11/12/15 162 lb (73.5 kg)  08/26/15 157 lb (71.2 kg)   Constitutional: overweight, in NAD Eyes: PERRLA, EOMI, no exophthalmos ENT: moist mucous membranes, no thyromegaly, no cervical lymphadenopathy Cardiovascular: RRR, No MRG Respiratory: CTA B Gastrointestinal: abdomen soft, NT, ND, BS+ Musculoskeletal: no deformities, strength intact in all 4 Skin: moist, warm, no  rashes Neurological: no tremor with outstretched hands, DTR normal in all 4  ASSESSMENT: 1. DM2, non-insulin-dependent, uncontrolled, with complications - Microalbuminuria  PLAN:  1. Patient with long-standing, uncontrolled diabetes, on oral antidiabetic regimen, which became insufficient.  - Last HbA1c was 11.2% 2 months ago, but we rechecked it today: HbA1c today is 9.5%. - I had a long discussion with the patient that she is likely glucose toxic and suggested insulin. She started to cry at the thought of insulin, and would like to avoid this. In this case, since HbA1c is better than before, we can try a weekly GLP-1 receptor agonist that she agrees to start. We will also need to increase the Amaryl by adding half a tablet before dinner. I will switch her metformin to the extended-release formulation to help her tolerate the medication better. - I suggested to:  Patient Instructions  Please switch to Metformin ER 1000 mg 2x a day.  Increase Glimepiride to 4 mg before breakfast and add 2 mg before dinner.  Please add Trulicity 4.78 mg weekly.  Please call me when you are close to running out of Trulicity to send a prescription for the higher dose to your pharmacy.  Please return in 1.5 months with your sugar log.   - Strongly advised her to start checking sugars at different times of the day - check 2 times a day, rotating checks - given sugar log and advised how to fill it and to bring it at next appt  - given foot care handout and explained the principles  - given instructions for hypoglycemia management "15-15 rule"  - advised for yearly eye exams  - given flu shot today - Return to clinic in 1.5 mo with sugar log   Philemon Kingdom, MD PhD [facility] Endocrinology

## 2016-05-04 ENCOUNTER — Telehealth: Payer: Self-pay | Admitting: Internal Medicine

## 2016-05-04 ENCOUNTER — Telehealth: Payer: Self-pay

## 2016-05-04 NOTE — Telephone Encounter (Signed)
She is taking only 1 tablet of metformin she will have to stop this.  She can consider a higher dose of Trulicity if blood sugars are high

## 2016-05-04 NOTE — Telephone Encounter (Signed)
Patient stated that the medication is causing diarrhea. Please advise

## 2016-05-04 NOTE — Telephone Encounter (Signed)
Called and spoke with patient and she believes that the metformin is causing her diarrhea; Patient is only taking one metformin pill now because two was too much, and patient states today she has only been to the restroom twice today. Blood sugar was 142 yesterday 05/03/16. Patient asking if she can cut back on the metformin or change. Patient is taking glimepiride..Please advise in Dr.Gherghe's absence, as I will copy her to this message as well. Thank you!

## 2016-05-05 ENCOUNTER — Other Ambulatory Visit: Payer: Self-pay

## 2016-05-05 MED ORDER — DULAGLUTIDE 1.5 MG/0.5ML ~~LOC~~ SOAJ: SUBCUTANEOUS | 2 refills | Status: DC

## 2016-05-05 NOTE — Telephone Encounter (Signed)
Yes, she can inject 1.5 mg Trulicity every week. We can send this in for her, but until then, she can take 2 of the 0.75 mg doses per week.

## 2016-05-05 NOTE — Telephone Encounter (Signed)
Called patient and notified her to start to increase the trulicity. I ordered her medication, but advised her to take two injection until she could get that and to stop the metformin. Patient will start taking that tomorrow. No other questions at this time.

## 2016-05-19 ENCOUNTER — Other Ambulatory Visit: Payer: Self-pay

## 2016-05-20 ENCOUNTER — Other Ambulatory Visit: Payer: Self-pay

## 2016-05-20 MED ORDER — DULAGLUTIDE 1.5 MG/0.5ML ~~LOC~~ SOAJ: SUBCUTANEOUS | 2 refills | Status: DC

## 2016-06-09 ENCOUNTER — Ambulatory Visit (INDEPENDENT_AMBULATORY_CARE_PROVIDER_SITE_OTHER): Payer: Medicare Other | Admitting: Internal Medicine

## 2016-06-09 ENCOUNTER — Encounter: Payer: Self-pay | Admitting: Internal Medicine

## 2016-06-09 VITALS — BP 142/80 | HR 74 | Wt 159.0 lb

## 2016-06-09 DIAGNOSIS — E1129 Type 2 diabetes mellitus with other diabetic kidney complication: Secondary | ICD-10-CM | POA: Diagnosis not present

## 2016-06-09 DIAGNOSIS — R809 Proteinuria, unspecified: Secondary | ICD-10-CM

## 2016-06-09 MED ORDER — GLIMEPIRIDE 4 MG PO TABS: ORAL_TABLET | ORAL | 3 refills | Status: DC

## 2016-06-09 NOTE — Progress Notes (Signed)
Patient ID: Ann [name], female   DOB: 1945/12/05, 70 y.o.   MRN: [id number]   HPI: Ann [name] is a 70 y.o.-year-old female, initially referred by Dr. Orland Mustard, now returning for f/u for DM2, dx in 2006, non-insulin-dependent, uncontrolled, with complications (microalbuminuria). Last visit 1.5 mo ago.  Last hemoglobin A1c was: Lab Results  Component Value Date   HGBA1C 9.5 04/28/2016  02/25/2016: 11.2% Previously 9.7% Previously 8.9%  Pt is on a regimen of: - Amaryl 4 mg in a.m. And 2 mg in pm - Trulicity 1.5 mg weekly - last 3 weeks of the higher dose  - we started this 04/2016. She feels much better since we started. She was on Metformin 1000 mg 2x a day >> diarrhea >> stopped She refused insulin.  Pt checks her sugars 2x a day: - am: 122, 155-182, 212 (URI) >> 117, 135-170, 181 - 2h after b'fast: n/c - before lunch: n/c - 2h after lunch: n/c - before dinner: 180-190, 200s >> 102, 168 - 2h after dinner: n/c >> 125-205 - bedtime: n/c >> 117, 150-238, 270 - nighttime: n/c >> 143-179 No lows. Lowest sugar was 122 >> 117; she has hypoglycemia awareness at 71.  Highest sugar was 250 >> 270.  Glucometer: Freestyle Lite  Pt's meals are: - Breakfast: cereal, boiled egg - Lunch: sandwich, salad - Dinner: meat + salad, potato - Snacks: 3  She saw nutrition in the past.  - no CKD, last BUN/creatinine:  02/25/2016: BUN/creatinine 16/0.92, EGFR 60/73. At that time, glucose was 259, with a sodium of 133.   05/28/2015: Urine ACR 45.6 - last set of lipids: 05/28/2015: 170/173/34/102 - last eye exam was in 04/2016. No DR. + macular degeneration. - no numbness and tingling in her feet.  Pt has FH of DM in mother, sisters.  ROS: Constitutional: no weight gain/loss, no fatigue Eyes: no blurry vision, no xerophthalmia ENT: no sore throat, no nodules palpated in throat, no dysphagia/odynophagia, no hoarseness Cardiovascular: no CP/SOB/palpitations/leg  swelling Respiratory: no cough/SOB Gastrointestinal: no N/V/D/+ C Musculoskeletal: no muscle/joint aches Skin: no rashes Neurological: no tremors/numbness/tingling/dizziness  I reviewed pt's medications, allergies, PMH, social hx, family hx, and changes were documented in the history of present illness. Otherwise, unchanged from my initial visit note.  Past Medical History:  Diagnosis Date  . Diabetes mellitus without complication ([place])   . Hypertension    Past Surgical History:  Procedure Laterality Date  . ABDOMINAL HYSTERECTOMY    . knee surgery  04/2013   Social History   Social History  . Marital status: Married    Spouse name: N/A  . Number of children: 3   Occupational History  . Retired Tourist information centre manager   Social History Main Topics  . Smoking status: Never Smoker  . Smokeless tobacco: No  . Alcohol use No  . Drug use: no   Current Outpatient Prescriptions on File Prior to Visit  Medication Sig Dispense Refill  . amLODipine (NORVASC) 10 MG tablet Take 10 mg by mouth daily. Unknown dose    . atenolol (TENORMIN) 100 MG tablet Take 100 mg by mouth daily. Unknown dose    . Dulaglutide (TRULICITY) 1.5 HY/8.6VH SOPN Inject into the skin weekly. 4 pen 2  . estradiol (VIVELLE-DOT) 0.025 MG/24HR Place 1 patch onto the skin 2 (two) times a week. Unknown dose    . fluticasone (FLONASE) 50 MCG/ACT nasal spray Place 2 sprays into both nostrils daily.    Marland Kitchen glimepiride (AMARYL) 4 MG tablet Please take  1 tablet before b'fast and 2 mg before dinner 135 tablet 3  . lisinopril (PRINIVIL,ZESTRIL) 10 MG tablet Take 10 mg by mouth daily. Unknown dose    . lisinopril-hydrochlorothiazide (PRINZIDE,ZESTORETIC) 20-25 MG tablet     . metFORMIN (GLUCOPHAGE-XR) 500 MG 24 hr tablet Take 2 tablets (1,000 mg total) by mouth 2 (two) times daily with a meal. 360 tablet 3  . montelukast (SINGULAIR) 10 MG tablet Take 10 mg by mouth at bedtime. Unknown dose     No current facility-administered medications  on file prior to visit.    Allergies  Allergen Reactions  . Sulfa Antibiotics      FH: - Diabetes in mother, sister, brother - Hypertension in mother and sister - Hyperlipidemia in brother  PE: BP (!) 142/80   Pulse 74   Wt 159 lb (72.1 kg)   SpO2 98%   BMI 29.08 kg/m  Wt Readings from Last 3 Encounters:  06/09/16 159 lb (72.1 kg)  04/28/16 159 lb (72.1 kg)  11/12/15 162 lb (73.5 kg)   Constitutional: overweight, in NAD Eyes: PERRLA, EOMI, no exophthalmos ENT: moist mucous membranes, no thyromegaly, no cervical lymphadenopathy Cardiovascular: RRR, No MRG Respiratory: CTA B Gastrointestinal: abdomen soft, NT, ND, BS+ Musculoskeletal: no deformities, strength intact in all 4 Skin: moist, warm, no rashes Neurological: no tremor with outstretched hands, DTR normal in all 4  ASSESSMENT: 1. DM2, non-insulin-dependent, uncontrolled, with complications - Microalbuminuria  PLAN:  1. Patient with long-standing, uncontrolled diabetes, on oral antidiabetic regimen + now Trulicity added at last visit. Her sugars are better, with occasional spikes in the 200s, especially close to next Trulicity dose. Sugars mostly high at night and in am >> will increase Amaryl with dinner. Since she is constipated, I advised her to retry Metformin as this caused her more frequent stools before. - Last HbA1c was 9.5% (better) - I suggested to:  Patient Instructions  Please continue: - Trulicity 1.5 mg weekly  Increase: - Glimepiride 4 mg 2x a day before meals  You can retry adding 1 x tablet Metformin ER with dinner.  Please come back for a follow-up appointment in 2 months   - checksugars at different times of the day - check 2 times a day, rotating checks - advised for yearly eye exams  >> she is UTD - given flu shot at last visit  - Return to clinic in 2 mo with sugar log   Philemon Kingdom, MD PhD [facility] Endocrinology

## 2016-06-09 NOTE — Patient Instructions (Addendum)
Please continue: - Trulicity 1.5 mg weekly  Increase: - Glimepiride 4 mg 2x a day before meals  You can retry adding 1 x tablet Metformin ER with dinner.  Please come back for a follow-up appointment in 2 months

## 2016-07-02 ENCOUNTER — Other Ambulatory Visit: Payer: Self-pay | Admitting: Internal Medicine

## 2016-08-11 ENCOUNTER — Ambulatory Visit (INDEPENDENT_AMBULATORY_CARE_PROVIDER_SITE_OTHER): Payer: Medicare Other | Admitting: Internal Medicine

## 2016-08-11 ENCOUNTER — Encounter: Payer: Self-pay | Admitting: Internal Medicine

## 2016-08-11 VITALS — BP 128/82 | HR 82 | Ht 62.0 in | Wt 157.0 lb

## 2016-08-11 DIAGNOSIS — E1129 Type 2 diabetes mellitus with other diabetic kidney complication: Secondary | ICD-10-CM | POA: Diagnosis not present

## 2016-08-11 DIAGNOSIS — R809 Proteinuria, unspecified: Secondary | ICD-10-CM

## 2016-08-11 LAB — POCT GLYCOSYLATED HEMOGLOBIN (HGB A1C): HEMOGLOBIN A1C: 6.8

## 2016-08-11 MED ORDER — GLUCOSE BLOOD VI STRP: ORAL_STRIP | 11 refills | Status: DC

## 2016-08-11 NOTE — Progress Notes (Signed)
Patient ID: Ann [name], female   DOB: January 21, 1946, 71 y.o.   MRN: [id number]   HPI: Ann [name] is a 71 y.o.-year-old female, initially referred by Dr. Orland Mustard, now returning for f/u for DM2, dx in 2006, non-insulin-dependent, uncontrolled, with complications (microalbuminuria). Last visit 2 mo ago. New PCP: DR. [name].  Last hemoglobin A1c was: Lab Results  Component Value Date   HGBA1C 9.5 04/28/2016  02/25/2016: 11.2% Previously 9.7% Previously 8.9%  Pt is on a regimen of: - Amaryl 4 mg in a.m. and 2 >> 4 mg in pm - Trulicity 1.5 mg weekly- we started this 04/2016. She feels much better since we started. - Metformin ER 500 mg with dinner She was on Metformin 1000 mg 2x a day >> diarrhea >> stopped. She refused insulin.  Pt checks her sugars 2x a day (forgot log): - am: 122, 155-182, 212 (URI) >> 117, 135-170, 181 >> 130-140, 157 - 2h after b'fast: n/c - before lunch: n/c - 2h after lunch: n/c - before dinner: 180-190, 200s >> 102, 168 >> 130-150 - 2h after dinner: n/c >> 125-205 >> n/c - bedtime: n/c >> 117, 150-238, 270 >> 150-160s - nighttime: n/c >> 143-179 >> n/c No lows. Lowest sugar was 122 >> 117 >> 130; she has hypoglycemia awareness at 71.  Highest sugar was 250 >> 270 >> 160.  Glucometer: Freestyle Lite  Pt's meals are: - Breakfast: cereal, boiled egg - Lunch: sandwich, salad - Dinner: meat + salad, potato - Snacks: 3  She saw nutrition in the past.  - no CKD, last BUN/creatinine:  06/30/2016: 14/0.92 02/25/2016: 16/0.92, EGFR 60/73. At that time, glucose was 259, with a sodium of 133.   05/28/2015: Urine ACR 45.6 - last set of lipids: 06/30/2016: 163/186/33/92 05/28/2015: 170/173/34/102 - last eye exam was in 04/2016. No DR. + macular degeneration. New Dr: Dr. Ricki Miller  - [facility]. - no numbness and tingling in her feet.  ROS: Constitutional: no weight gain/loss, no fatigue Eyes: no blurry vision, no xerophthalmia ENT:  no sore throat, no nodules palpated in throat, no dysphagia/odynophagia, no hoarseness Cardiovascular: no CP/SOB/palpitations/leg swelling Respiratory: no cough/SOB Gastrointestinal: no N/V/D/C/+ gas while taking Metformin Musculoskeletal: no muscle/joint aches Skin: no rashes Neurological: no tremors/numbness/tingling/dizziness  I reviewed pt's medications, allergies, PMH, social hx, family hx, and changes were documented in the history of present illness. Otherwise, unchanged from my initial visit note.  Past Medical History:  Diagnosis Date  . Diabetes mellitus without complication ([place])   . Hypertension    Past Surgical History:  Procedure Laterality Date  . ABDOMINAL HYSTERECTOMY    . knee surgery  04/2013   Social History   Social History  . Marital status: Married    Spouse name: N/A  . Number of children: 3   Occupational History  . Retired Tourist information centre manager   Social History Main Topics  . Smoking status: Never Smoker  . Smokeless tobacco: No  . Alcohol use No  . Drug use: no   Current Outpatient Prescriptions on File Prior to Visit  Medication Sig Dispense Refill  . amLODipine (NORVASC) 10 MG tablet Take 10 mg by mouth daily. Unknown dose    . atenolol (TENORMIN) 100 MG tablet Take 100 mg by mouth daily. Unknown dose    . Dulaglutide (TRULICITY) 1.5 KG/4.0NU SOPN Inject into the skin weekly. 4 pen 2  . estradiol (VIVELLE-DOT) 0.025 MG/24HR Place 1 patch onto the skin 2 (two) times a week. Unknown dose    .  fluticasone (FLONASE) 50 MCG/ACT nasal spray Place 2 sprays into both nostrils daily.    Marland Kitchen glimepiride (AMARYL) 4 MG tablet Please take 1 tablet before b'fast and  before dinner 135 tablet 3  . lisinopril (PRINIVIL,ZESTRIL) 10 MG tablet Take 10 mg by mouth daily. Unknown dose    . lisinopril-hydrochlorothiazide (PRINZIDE,ZESTORETIC) 20-25 MG tablet     . montelukast (SINGULAIR) 10 MG tablet Take 10 mg by mouth at bedtime. Unknown dose    . TRULICITY 1.5 ZH/0.8MV SOPN  INJECT INTO THE SKIN WEEKLY. 4 pen 2   No current facility-administered medications on file prior to visit.    Allergies  Allergen Reactions  . Sulfa Antibiotics      FH: - Diabetes in mother, sister, brother - Hypertension in mother and sister - Hyperlipidemia in brother  PE: BP 128/82 (BP Location: Left Arm, Patient Position: Sitting)   Pulse 82   Ht 5' 2"  (1.575 m)   Wt 157 lb (71.2 kg)   SpO2 98%   BMI 28.72 kg/m  Wt Readings from Last 3 Encounters:  08/11/16 157 lb (71.2 kg)  06/09/16 159 lb (72.1 kg)  04/28/16 159 lb (72.1 kg)   Constitutional: overweight, in NAD Eyes: PERRLA, EOMI, no exophthalmos ENT: moist mucous membranes, no thyromegaly, no cervical lymphadenopathy Cardiovascular: RRR, No MRG Respiratory: CTA B Gastrointestinal: abdomen soft, NT, ND, BS+ Musculoskeletal: no deformities, strength intact in all 4 Skin: moist, warm, no rashes Neurological: no tremor with outstretched hands, DTR normal in all 4  ASSESSMENT: 1. DM2, non-insulin-dependent, uncontrolled, with complications - Microalbuminuria  PLAN:  1. Patient with long-standing, uncontrolled diabetes, on oral antidiabetic regimen + Trulicity and Metformin low dose. Her sugars are better, despite the Holidays. No need to change her regimen for now. - Last HbA1c was 9.5% (better) >> 6.8% (EXCELLENT). - I suggested to:  Patient Instructions  Please continue: - Trulicity 1.5 mg weekly - Glimepiride 4 mg before b'fast and dinner - Metformin ER 500 mg with dinner.  Please come back for a follow-up appointment in 3 months with your sugar log.   - checksugars at different times of the day - check 2 times a day, rotating checks - advised for yearly eye exams  >> she is UTD - given flu shot this season - Return to clinic in 3 mo with sugar log   Philemon Kingdom, MD PhD [facility] Endocrinology

## 2016-08-11 NOTE — Patient Instructions (Addendum)
Please continue: - Trulicity 1.5 mg weekly - Glimepiride 4 mg before b'fast and dinner - Metformin ER 500 mg with dinner.  Please come back for a follow-up appointment in 3 months with your sugar log.

## 2016-08-11 NOTE — Addendum Note (Signed)
Addended by: Caprice Beaver T on: 08/11/2016 02:39 PM   Modules accepted: Orders

## 2016-09-16 LAB — HM DIABETES EYE EXAM

## 2016-11-12 ENCOUNTER — Ambulatory Visit (INDEPENDENT_AMBULATORY_CARE_PROVIDER_SITE_OTHER): Payer: Medicare Other | Admitting: Internal Medicine

## 2016-11-12 ENCOUNTER — Encounter: Payer: Self-pay | Admitting: Internal Medicine

## 2016-11-12 VITALS — BP 112/60 | HR 80 | Temp 97.4°F | Ht 62.0 in | Wt 157.8 lb

## 2016-11-12 DIAGNOSIS — R809 Proteinuria, unspecified: Secondary | ICD-10-CM

## 2016-11-12 DIAGNOSIS — E1129 Type 2 diabetes mellitus with other diabetic kidney complication: Secondary | ICD-10-CM | POA: Diagnosis not present

## 2016-11-12 LAB — POCT GLYCOSYLATED HEMOGLOBIN (HGB A1C): HEMOGLOBIN A1C: 7

## 2016-11-12 MED ORDER — DULAGLUTIDE 1.5 MG/0.5ML ~~LOC~~ SOAJ: SUBCUTANEOUS | 3 refills | Status: DC

## 2016-11-12 MED ORDER — METFORMIN HCL ER 500 MG PO TB24: 500.0000 mg | ORAL_TABLET | Freq: Two times a day (BID) | ORAL | 3 refills | Status: DC

## 2016-11-12 NOTE — Progress Notes (Signed)
Patient ID: Ann [name], female   DOB: Jan 19, 1946, 71 y.o.   MRN: [id number]   HPI: Ann [name] is a 71 y.o.-year-old female, initially referred by Dr. Orland Mustard, now returning for f/u for DM2, dx in 2006, non-insulin-dependent, uncontrolled, with complications (microalbuminuria). Last visit 2 mo ago. New PCP: DR. [name].  Last hemoglobin A1c was: Lab Results  Component Value Date   HGBA1C 6.8 08/11/2016   HGBA1C 9.5 04/28/2016  02/25/2016: 11.2% Previously 9.7% Previously 8.9%  Pt is on a regimen of: - Amaryl  2 >> 4 mg in pm - Trulicity 1.5 mg weekly- we started this 04/2016. She feels much better after we started this. - Metformin ER 500 mg with dinner She was on Metformin 1000 mg 2x a day >> diarrhea >> stopped. She refused insulin.  Pt checks her sugars 2x a day (forgot log): - am: 122, 155-182, 212 (URI) >> 117, 135-170, 181 >> 130-140, 157 >> 130-140 - 2h after b'fast: n/c - before lunch: n/c - 2h after lunch: n/c - before dinner: 180-190, 200s >> 102, 168 >> 130-150 >> n/c - 2h after dinner: n/c >> 125-205 >> n/c - bedtime: n/c >> 117, 150-238, 270 >> 150-160s >> 150-180 - nighttime: n/c >> 143-179 >> n/c No lows. Lowest sugar was 122 >> 117 >> 130 >> 130s; she has hypoglycemia awareness at 71.  Highest sugar was 250 >> 270 >> 160 >> 180.  Glucometer: Freestyle Lite  Pt's meals are: - Breakfast: cereal, boiled egg - Lunch: sandwich, salad - Dinner: meat + salad, potato - Snacks: 3 She saw nutrition in the past.  - no CKD, last BUN/creatinine:  06/30/2016: 14/0.92 02/25/2016: 16/0.92, EGFR 60/73. At that time, glucose was 259, with a sodium of 133.   05/28/2015: Urine ACR 45.6 - last set of lipids: 06/30/2016: 163/186/33/92 05/28/2015: 170/173/34/102 - last eye exam was in 08/2016. No DR. + macular degeneration. Dr. Ricki Miller  - [facility]. - no numbness and tingling in her feet.  ROS: Constitutional: no weight gain/loss, no fatigue,  no subjective hyperthermia/hypothermia Eyes: no blurry vision, no xerophthalmia ENT: no sore throat, no nodules palpated in throat, no dysphagia/odynophagia, no hoarseness Cardiovascular: no CP/SOB/palpitations/leg swelling Respiratory: no cough/SOB Gastrointestinal: no N/V/D/C Musculoskeletal: no muscle/joint aches Skin: no rashes Neurological: no tremors/numbness/tingling/dizziness  I reviewed pt's medications, allergies, PMH, social hx, family hx, and changes were documented in the history of present illness. Otherwise, unchanged from my initial visit note.  Past Medical History:  Diagnosis Date  . Diabetes mellitus without complication ([place])   . Hypertension    Past Surgical History:  Procedure Laterality Date  . ABDOMINAL HYSTERECTOMY    . knee surgery  04/2013   Social History   Social History  . Marital status: Married    Spouse name: N/A  . Number of children: 3   Occupational History  . Retired Tourist information centre manager   Social History Main Topics  . Smoking status: Never Smoker  . Smokeless tobacco: No  . Alcohol use No  . Drug use: no   Current Outpatient Prescriptions on File Prior to Visit  Medication Sig Dispense Refill  . amLODipine (NORVASC) 10 MG tablet Take 10 mg by mouth daily. Unknown dose    . atenolol (TENORMIN) 100 MG tablet Take 100 mg by mouth daily. Unknown dose    . Dulaglutide (TRULICITY) 1.5 YB/0.1BP SOPN Inject into the skin weekly. 4 pen 2  . estradiol (VIVELLE-DOT) 0.025 MG/24HR Place 1 patch onto the skin 2 (  two) times a week. Unknown dose    . fluticasone (FLONASE) 50 MCG/ACT nasal spray Place 2 sprays into both nostrils daily.    Marland Kitchen glimepiride (AMARYL) 4 MG tablet Please take 1 tablet before b'fast and  before dinner 135 tablet 3  . glucose blood (FREESTYLE TEST STRIPS) test strip Use 2x a day 100 each 11  . lisinopril (PRINIVIL,ZESTRIL) 10 MG tablet Take 10 mg by mouth daily. Unknown dose    . lisinopril-hydrochlorothiazide (PRINZIDE,ZESTORETIC)  20-25 MG tablet     . montelukast (SINGULAIR) 10 MG tablet Take 10 mg by mouth at bedtime. Unknown dose    . TRULICITY 1.5 UT/6.5YY SOPN INJECT INTO THE SKIN WEEKLY. 4 pen 2   No current facility-administered medications on file prior to visit.    Allergies  Allergen Reactions  . Sulfa Antibiotics      FH: - Diabetes in mother, sister, brother - Hypertension in mother and sister - Hyperlipidemia in brother  PE: BP 112/60 (BP Location: Left Arm, Patient Position: Sitting, Cuff Size: Normal)   Pulse 80   Temp 97.4 F (36.3 C) (Oral)   Ht _0  (1.575 m)   Wt 157 lb 12.8 oz (71.6 kg)   SpO2 98%   BMI 28.86 kg/m  Wt Readings from Last 3 Encounters:  11/12/16 157 lb 12.8 oz (71.6 kg)  08/11/16 157 lb (71.2 kg)  06/09/16 159 lb (72.1 kg)   Constitutional: overweight, in NAD Eyes: PERRLA, EOMI, no exophthalmos ENT: moist mucous membranes, no thyromegaly, no cervical lymphadenopathy Cardiovascular: RRR, No MRG Respiratory: CTA B Gastrointestinal: abdomen soft, NT, ND, BS+ Musculoskeletal: no deformities, strength intact in all 4 Skin: moist, warm, no rashes Neurological: no tremor with outstretched hands, DTR normal in all 4  ASSESSMENT: 1. DM2, non-insulin-dependent, uncontrolled, with complications - Microalbuminuria  PLAN:  1. Patient with long-standing, uncontrolled diabetes, on oral antidiabetic regimen + Trulicity and low dose of metformin. Sugars are slightly higher than target, and we discussed about trying to increase metformin extended-release to twice a day. I advised her that if this gives her diarrhea, she can reduce it again to once a day. She is now taking Amaryl only before dinner, and she forgot that she needs to take it before breakfast also. Since I don't have sugars in the middle of the day, I am not sure whether he would be safe for her to increase. I advised her to start checking her sugars in the middle of the day and if they are high, to also add Amaryl  before breakfast. - HbA1c today is 7.0% (slightly higher). - I suggested to:  Patient Instructions  Please check some sugars mid-day and may need to add Amaryl in am, before b'fast if sugars are high.  Please try to increase Metformin ER to 2x a day.  Continue Trulicity 1.5 mg weekly.  Please return in 3 months with your sugar log.    - checksugars at different times of the day - check 2 times a day, rotating checks - She is up to date with annual eye exams - She is up-to-date with her flu shot - Return to clinic in 3 mo with sugar log   Philemon Kingdom, MD PhD [facility] Endocrinology

## 2016-11-12 NOTE — Patient Instructions (Signed)
Please check some sugars mid-day and may need to add Amaryl in am, before b'fast if sugars are high.  Please try to increase Metformin ER to 2x a day.  Continue Trulicity 1.5 mg weekly.  .Please return in 3 months with your sugar log.

## 2017-02-24 ENCOUNTER — Encounter: Payer: Self-pay | Admitting: Internal Medicine

## 2017-02-24 ENCOUNTER — Ambulatory Visit (INDEPENDENT_AMBULATORY_CARE_PROVIDER_SITE_OTHER): Payer: Medicare Other | Admitting: Internal Medicine

## 2017-02-24 VITALS — BP 130/80 | HR 76 | Ht 63.5 in | Wt 157.0 lb

## 2017-02-24 DIAGNOSIS — R809 Proteinuria, unspecified: Secondary | ICD-10-CM | POA: Diagnosis not present

## 2017-02-24 DIAGNOSIS — E1129 Type 2 diabetes mellitus with other diabetic kidney complication: Secondary | ICD-10-CM

## 2017-02-24 LAB — POCT GLYCOSYLATED HEMOGLOBIN (HGB A1C): HEMOGLOBIN A1C: 6.8

## 2017-02-24 MED ORDER — GLUCOSE BLOOD VI STRP
ORAL_STRIP | 11 refills | Status: DC
Start: 2017-02-24 — End: 2024-02-25

## 2017-02-24 NOTE — Patient Instructions (Addendum)
Please continue: - Metformin ER x2 at dinnertime. - Trulicity 1.5 mg weekly in am  Please return in 3-4 months with your sugar log.

## 2017-02-24 NOTE — Progress Notes (Signed)
Patient ID: Ann [name], female   DOB: 18-Feb-1946, 71 y.o.   MRN: [id number]   HPI: Ann [name] is a 71 y.o.-year-old female, initially referred by Dr. Orland Mustard, now returning for f/u for DM2, dx in 2006, non-insulin-dependent, uncontrolled, with complications (microalbuminuria). Last visit 3.5 mo ago. New PCP: DR. [name].  Last hemoglobin A1c was: Lab Results  Component Value Date   HGBA1C 7.0 11/12/2016   HGBA1C 6.8 08/11/2016   HGBA1C 9.5 04/28/2016  02/25/2016: 11.2% Previously 9.7% Previously 8.9%  Pt is on a regimen of: - Trulicity 1.5 mg weekly- we started this 04/2016. Feels this is greatly helping. - Metformin ER 500 mg with dinner >> 2x a day We stopped Amaryl. She was on Metformin 1000 mg 2x a day >> diarrhea >> stopped. She refused insulin.  Pt checks her sugars 2x a day: - am: 117, 135-170, 181 >> 130-140, 157 >> 130-140 >> 118-135 - 2h after b'fast: n/c - before lunch: n/c - 2h after lunch: n/c - before dinner: 180-190, 200s >> 102, 168 >> 130-150 >> n/c - 2h after dinner: n/c >> 125-205 >> n/c - bedtime: n/c >> 117, 150-238, 270 >> 150-160s >> 150-180 >> 150s usually, 162, 180 - nighttime: n/c >> 143-179 >> n/c No lows. Lowest sugar was 130s >> 118; she has hypoglycemia awareness at 71.  Highest sugar was 180 >> 180s.  Glucometer: Freestyle Lite  Pt's meals are: - Breakfast: cereal, boiled egg, juice - Lunch: sandwich, salad - Dinner: meat + salad, potato - Snacks: 3 She saw nutrition in the past.  - No CKD, last BUN/creatinine:  06/30/2016: 14/0.92 02/25/2016: 16/0.92, EGFR 60/73. At that time, glucose was 259, with a sodium of 133.   05/28/2015: Urine ACR 45.6  - last set of lipids: 06/30/2016: 163/186/33/92 05/28/2015: 170/173/34/102  - last eye exam was in 08/2016 >> No DR. She has macular degeneration. Dr. Ricki Miller  - [facility]. - denies numbness and tingling in her feet.  ROS: Constitutional: no weight gain/no  weight loss, no fatigue, no subjective hyperthermia, no subjective hypothermia Eyes: no blurry vision, no xerophthalmia ENT: no sore throat, no nodules palpated in throat, no dysphagia, no odynophagia, no hoarseness Cardiovascular: no CP/no SOB/no palpitations/no leg swelling Respiratory: no cough/no SOB/no wheezing Gastrointestinal: no N/no V/no D/no C/no acid reflux Musculoskeletal: no muscle aches/+ joint aches (ankle) Skin: no rashes, no hair loss Neurological: no tremors/no numbness/no tingling/no dizziness  I reviewed pt's medications, allergies, PMH, social hx, family hx, and changes were documented in the history of present illness. Otherwise, unchanged from my initial visit note.  Past Medical History:  Diagnosis Date  . Diabetes mellitus without complication ([place])   . Hypertension    Past Surgical History:  Procedure Laterality Date  . ABDOMINAL HYSTERECTOMY    . knee surgery  04/2013   Social History   Social History  . Marital status: Married    Spouse name: N/A  . Number of children: 3   Occupational History  . Retired Tourist information centre manager   Social History Main Topics  . Smoking status: Never Smoker  . Smokeless tobacco: No  . Alcohol use No  . Drug use: no   Current Outpatient Prescriptions on File Prior to Visit  Medication Sig Dispense Refill  . amLODipine (NORVASC) 10 MG tablet Take 10 mg by mouth daily. Unknown dose    . atenolol (TENORMIN) 100 MG tablet Take 100 mg by mouth daily. Unknown dose    . Dulaglutide (TRULICITY) 1.5  MG/0.5ML SOPN INJECT INTO THE SKIN WEEKLY. 12 pen 3  . estradiol (VIVELLE-DOT) 0.025 MG/24HR Place 1 patch onto the skin 2 (two) times a week. Unknown dose    . fluticasone (FLONASE) 50 MCG/ACT nasal spray Place 2 sprays into both nostrils daily.    Marland Kitchen glimepiride (AMARYL) 4 MG tablet Please take 1 tablet before b'fast and  before dinner 135 tablet 3  . glucose blood (FREESTYLE TEST STRIPS) test strip Use 2x a day 100 each 11  . lisinopril  (PRINIVIL,ZESTRIL) 10 MG tablet Take 10 mg by mouth daily. Unknown dose    . metFORMIN (GLUCOPHAGE-XR) 500 MG 24 hr tablet Take 1 tablet (500 mg total) by mouth 2 (two) times daily with a meal. 180 tablet 3  . montelukast (SINGULAIR) 10 MG tablet Take 10 mg by mouth at bedtime. Unknown dose    . lisinopril-hydrochlorothiazide (PRINZIDE,ZESTORETIC) 20-25 MG tablet      No current facility-administered medications on file prior to visit.    Allergies  Allergen Reactions  . Sulfa Antibiotics      FH: - Diabetes in mother, sister, brother - Hypertension in mother and sister - Hyperlipidemia in brother  PE: BP 130/80 (BP Location: Left Arm, Patient Position: Sitting)   Pulse 76   Ht 5' 3.5" (1.613 m)   Wt 157 lb (71.2 kg)   SpO2 97%   BMI 27.38 kg/m   Wt Readings from Last 3 Encounters:  02/24/17 157 lb (71.2 kg)  11/12/16 157 lb 12.8 oz (71.6 kg)  08/11/16 157 lb (71.2 kg)   Constitutional: slightly overweight, in NAD Eyes: PERRLA, EOMI, no exophthalmos ENT: moist mucous membranes, no thyromegaly, no cervical lymphadenopathy Cardiovascular: RRR, No MRG Respiratory: CTA B Gastrointestinal: abdomen soft, NT, ND, BS+ Musculoskeletal: no deformities, strength intact in all 4 Skin: moist, warm, no rashes Neurological: no tremor with outstretched hands, DTR normal in all 4  ASSESSMENT: 1. DM2, non-insulin-dependent, uncontrolled, with complications - Microalbuminuria >> will need repeat at next visit  PLAN:  1. Patient with long-standing, uncontrolled diabetes, with improved control after we added Trulicity, only with slightly higher sugars lately 2/2 dietary indiscretions. She may have a higher sugar at bedtime and subsequently in am. However, these are not very high and overall, she has good control. No need to change the regimen. - Metformin gives her diarrhea occasionally, but no major pbs's and she would like to continue it - I suggested to:  Patient Instructions  Please  continue: - Metformin ER x2 at dinnertime. - Trulicity 1.5 mg weekly in am  Please return in 3-4 months with your sugar log.   - today, HbA1c is 6.8% (better!) - continue checking sugars at different times of the day - check 2x a day, rotating checks - advised for yearly eye exams >> she is UTD - Return to clinic in 3 mo with sugar log   Philemon Kingdom, MD PhD [facility]

## 2017-06-03 ENCOUNTER — Ambulatory Visit: Payer: Medicare Other | Admitting: Internal Medicine

## 2017-06-03 ENCOUNTER — Encounter: Payer: Self-pay | Admitting: Internal Medicine

## 2017-06-03 VITALS — BP 128/80 | HR 69 | Ht 62.5 in | Wt 159.0 lb

## 2017-06-03 DIAGNOSIS — R809 Proteinuria, unspecified: Secondary | ICD-10-CM

## 2017-06-03 DIAGNOSIS — E1129 Type 2 diabetes mellitus with other diabetic kidney complication: Secondary | ICD-10-CM

## 2017-06-03 LAB — POCT GLYCOSYLATED HEMOGLOBIN (HGB A1C): Hemoglobin A1C: 6.7

## 2017-06-03 MED ORDER — GLIMEPIRIDE 2 MG PO TABS: ORAL_TABLET | ORAL | Status: DC

## 2017-06-03 NOTE — Progress Notes (Signed)
Patient ID: Ann [name], female   DOB: [birth year], 71 y.o.   MRN: [id number]   HPI: Ann [name] is a 71 y.o.-year-old female, initially referred by Dr. Orland Mustard, now returning for f/u for DM2, dx in 2006, non-insulin-dependent, uncontrolled, with complications (microalbuminuria). Last visit 3 mo ago. New PCP: DR. [name].  She had an episode of gout x 1 in 03/2017 >> started Allopurinol 3 weeks ago.  Last hemoglobin A1c was: Lab Results  Component Value Date   HGBA1C 6.8 02/24/2017   HGBA1C 7.0 11/12/2016   HGBA1C 6.8 08/11/2016  02/25/2016: 11.2% Previously 9.7% Previously 8.9%  Pt is on a regimen of: - Trulicity 1.5 mg weekly- we started this 04/2016 - Metformin ER 500 mg with dinner >> 2x a day - Amaryl 4 mg 2x a day before meals  She was on Metformin 1000 mg 2x a day >> diarrhea >> stopped. She refused insulin.  Pt checks her sugars 1x a day: - am: 130-140, 157 >> 130-140 >> 118-135 >> 130-140s, 150s when gout pain - 2h after b'fast: n/c - before lunch: n/c - 2h after lunch: n/c - before dinner: 102, 168 >> 130-150 >> n/c - 2h after dinner: n/c >> 125-205 >> n/c - bedtime:150-180 >> 150s usually, 162, 180 >> 144-152 - nighttime: n/c >> 143-179 >> n/c Lowest sugar was 130s >> 118 >> 120s; she has hypoglycemia awareness at 71.  Highest sugar was 180 >> 180s >> 157.  Glucometer: Freestyle Lite  Pt's meals are: - Breakfast: cereal, boiled egg, juice - Lunch: sandwich, salad - Dinner: meat + salad, potato - Snacks: 3 She saw nutrition in the past.  - No CKD, last BUN/creatinine:  06/30/2016: 14/0.92 02/25/2016: 16/0.92, EGFR 60/73. At that time, glucose was 259, with a sodium of 133.   05/28/2015: Urine ACR 45.6  - last set of lipids: 06/30/2016: 163/186/33/92 05/28/2015: 170/173/34/102  - last eye exam was in 08/2016 >> No DR. She has macular degeneration. Dr. Ricki Miller  - [facility]. - no  numbness and tingling in her  feet.  ROS: Constitutional: no weight gain/no weight loss, no fatigue, no subjective hyperthermia, no subjective hypothermia Eyes: no blurry vision, no xerophthalmia ENT: no sore throat, no nodules palpated in throat, no dysphagia, no odynophagia, no hoarseness Cardiovascular: no CP/no SOB/no palpitations/no leg swelling Respiratory: no cough/no SOB/no wheezing Gastrointestinal: no N/no V/no D/no C/no acid reflux Musculoskeletal: no muscle aches/+ joint aches - ankle - resolved Skin: no rashes, no hair loss Neurological: no tremors/no numbness/no tingling/no dizziness  I reviewed pt's medications, allergies, PMH, social hx, family hx, and changes were documented in the history of present illness. Otherwise, unchanged from my initial visit note.   Past Medical History:  Diagnosis Date  . Diabetes mellitus without complication ([place])   . Hypertension    Past Surgical History:  Procedure Laterality Date  . ABDOMINAL HYSTERECTOMY    . knee surgery  04/2013   Social History   Social History  . Marital status: Married    Spouse name: N/A  . Number of children: 3   Occupational History  . Retired Tourist information centre manager   Social History Main Topics  . Smoking status: Never Smoker  . Smokeless tobacco: No  . Alcohol use No  . Drug use: no   Current Outpatient Medications on File Prior to Visit  Medication Sig Dispense Refill  . amLODipine (NORVASC) 10 MG tablet Take 10 mg by mouth daily. Unknown dose    . atenolol (TENORMIN)  100 MG tablet Take 100 mg by mouth daily. Unknown dose    . Dulaglutide (TRULICITY) 1.5 YF/7.4BS SOPN INJECT INTO THE SKIN WEEKLY. 12 pen 3  . estradiol (VIVELLE-DOT) 0.025 MG/24HR Place 1 patch onto the skin 2 (two) times a week. Unknown dose    . fluticasone (FLONASE) 50 MCG/ACT nasal spray Place 2 sprays into both nostrils daily.    Marland Kitchen glimepiride (AMARYL) 4 MG tablet Please take 1 tablet before b'fast and  before dinner 135 tablet 3  . glucose blood (FREESTYLE TEST  STRIPS) test strip Use 2x a day 100 each 11  . lisinopril (PRINIVIL,ZESTRIL) 10 MG tablet Take 10 mg by mouth daily. Unknown dose    . lisinopril-hydrochlorothiazide (PRINZIDE,ZESTORETIC) 20-25 MG tablet     . metFORMIN (GLUCOPHAGE-XR) 500 MG 24 hr tablet Take 1 tablet (500 mg total) by mouth 2 (two) times daily with a meal. 180 tablet 3  . montelukast (SINGULAIR) 10 MG tablet Take 10 mg by mouth at bedtime. Unknown dose     No current facility-administered medications on file prior to visit.    Allergies  Allergen Reactions  . Sulfa Antibiotics      FH: - Diabetes in mother, sister, brother - Hypertension in mother and sister - Hyperlipidemia in brother  PE: BP 128/80 (BP Location: Left Arm, Patient Position: Sitting)   Pulse 69   Ht 5' 2.5" (1.588 m)   Wt 159 lb (72.1 kg)   SpO2 99%   BMI 28.62 kg/m  Wt Readings from Last 3 Encounters:  06/03/17 159 lb (72.1 kg)  02/24/17 157 lb (71.2 kg)  11/12/16 157 lb 12.8 oz (71.6 kg)   Constitutional: slightly overweight, in NAD, looking much younger than stated age Eyes: PERRLA, EOMI, no exophthalmos ENT: moist mucous membranes, no thyromegaly, no cervical lymphadenopathy Cardiovascular: RRR, No MRG Respiratory: CTA B Gastrointestinal: abdomen soft, NT, ND, BS+ Musculoskeletal: no deformities, strength intact in all 4 Skin: moist, warm, no rashes Neurological: no tremor with outstretched hands, DTR normal in all 4  ASSESSMENT: 1. DM2, non-insulin-dependent, uncontrolled, with complications - Microalbuminuria  PLAN:  1. Patient with long-standing, previously uncontrolled diabetes, but much better control after we added Trulicity.  Sugars are still fairly well controlled, but not all at target.  Since the HbA1c is slightly lower compared to before: HbA1c is 6.7%, we will only decrease her glimepiride dose for now and continue the rest of the regimen.  I did advise her to have the full glimepiride tablets (4 mg) if she eats a  larger meal - Metformin gives her diarrhea occasionally, but no major pbs's and she would like to continue it >> she tells me that it only bothers her if she takes both tablets at the same time  - I suggested to:  Patient Instructions  Please continue: - Metformin ER 500 mg 2x a day with meals - Trulicity 1.5 mg weekly in am  Please decrease Glimepiride to 2 mg 2x a day before meals.  Please return in 4 months with your sugar log.   - continue checking sugars at different times of the day - check 1x a day, rotating checks - advised for yearly eye exams >> she is UTD - she had labs from PCP and will have an annual physical exam in 06/2017.  I advised her to ask Dr. Orland Mustard to also send me these results. - She had the flu shot this season - Return to clinic in 4 mo with sugar log  Philemon Kingdom, MD PhD [facility]

## 2017-06-03 NOTE — Patient Instructions (Signed)
Please continue: - Metformin ER 500 mg 2x a day with meals - Trulicity 1.5 mg weekly in am  Please decrease Glimepiride to 2 mg 2x a day before meals.  Please return in 4 months with your sugar log.

## 2017-06-22 ENCOUNTER — Encounter: Payer: Self-pay | Admitting: Internal Medicine

## 2017-06-22 LAB — HM DIABETES EYE EXAM

## 2017-06-30 ENCOUNTER — Other Ambulatory Visit: Payer: Self-pay | Admitting: Internal Medicine

## 2017-09-06 ENCOUNTER — Emergency Department (HOSPITAL_COMMUNITY)
Admission: EM | Admit: 2017-09-06 | Discharge: 2017-09-07 | Disposition: A | Discharge status: Home / Self Care | Payer: Medicare Other | Attending: Emergency Medicine | Admitting: Emergency Medicine

## 2017-09-06 ENCOUNTER — Encounter (HOSPITAL_COMMUNITY): Payer: Self-pay

## 2017-09-06 ENCOUNTER — Emergency Department (HOSPITAL_COMMUNITY): Payer: Medicare Other

## 2017-09-06 DIAGNOSIS — Z7984 Long term (current) use of oral hypoglycemic drugs: Secondary | ICD-10-CM | POA: Insufficient documentation

## 2017-09-06 DIAGNOSIS — R202 Paresthesia of skin: Secondary | ICD-10-CM | POA: Diagnosis not present

## 2017-09-06 DIAGNOSIS — F430 Acute stress reaction: Secondary | ICD-10-CM

## 2017-09-06 DIAGNOSIS — E119 Type 2 diabetes mellitus without complications: Secondary | ICD-10-CM | POA: Insufficient documentation

## 2017-09-06 DIAGNOSIS — Z79899 Other long term (current) drug therapy: Secondary | ICD-10-CM | POA: Insufficient documentation

## 2017-09-06 DIAGNOSIS — R0602 Shortness of breath: Secondary | ICD-10-CM | POA: Diagnosis present

## 2017-09-06 DIAGNOSIS — I10 Essential (primary) hypertension: Secondary | ICD-10-CM | POA: Diagnosis not present

## 2017-09-06 LAB — CBG MONITORING, ED: GLUCOSE-CAPILLARY: 142 mg/dL — AB (ref 65–99)

## 2017-09-06 LAB — CBC WITH DIFFERENTIAL/PLATELET
Basophils Absolute: 0.1 10*3/uL (ref 0.0–0.1)
Basophils Relative: 1 %
EOS PCT: 1 %
Eosinophils Absolute: 0.1 10*3/uL (ref 0.0–0.7)
HEMATOCRIT: 37.9 % (ref 36.0–46.0)
Hemoglobin: 12.4 g/dL (ref 12.0–15.0)
LYMPHS ABS: 3.6 10*3/uL (ref 0.7–4.0)
LYMPHS PCT: 28 %
MCH: 25.4 pg — AB (ref 26.0–34.0)
MCHC: 32.7 g/dL (ref 30.0–36.0)
MCV: 77.5 fL — AB (ref 78.0–100.0)
MONO ABS: 0.6 10*3/uL (ref 0.1–1.0)
MONOS PCT: 5 %
NEUTROS ABS: 8.5 10*3/uL — AB (ref 1.7–7.7)
Neutrophils Relative %: 65 %
PLATELETS: 536 10*3/uL — AB (ref 150–400)
RBC: 4.89 MIL/uL (ref 3.87–5.11)
RDW: 15.2 % (ref 11.5–15.5)
WBC: 12.9 10*3/uL — ABNORMAL HIGH (ref 4.0–10.5)

## 2017-09-06 LAB — I-STAT CHEM 8, ED
BUN: 18 mg/dL (ref 6–20)
CALCIUM ION: 1.03 mmol/L — AB (ref 1.15–1.40)
CREATININE: 1 mg/dL (ref 0.44–1.00)
Chloride: 103 mmol/L (ref 101–111)
GLUCOSE: 137 mg/dL — AB (ref 65–99)
HCT: 41 % (ref 36.0–46.0)
Hemoglobin: 13.9 g/dL (ref 12.0–15.0)
POTASSIUM: 4.2 mmol/L (ref 3.5–5.1)
Sodium: 142 mmol/L (ref 135–145)
TCO2: 26 mmol/L (ref 22–32)

## 2017-09-06 LAB — I-STAT TROPONIN, ED: Troponin i, poc: 0 ng/mL (ref 0.00–0.08)

## 2017-09-06 NOTE — ED Provider Notes (Signed)
[place] DEPT Provider Note   CSN: [id number] Arrival date & time: 09/06/17  2219     History   Chief Complaint Chief Complaint  Patient presents with  . Shortness of Breath  . shakey    HPI Ann [name] is a 71 y.o. female.  The history is provided by the patient.  Shortness of Breath  This is a new problem. The average episode lasts 2 weeks. The problem occurs continuously.The current episode started more than 1 week ago. The problem has not changed since onset.Pertinent negatives include no fever, no headaches, no coryza, no rhinorrhea, no sore throat, no swollen glands, no ear pain, no neck pain, no cough, no sputum production, no hemoptysis, no wheezing, no PND, no orthopnea, no chest pain, no syncope, no vomiting, no abdominal pain, no rash, no leg pain, no leg swelling and no claudication. Associated symptoms comments: Feels hot and jittery and her doctor has been changing her BP medication in response to this.  "just hasn't felt well overall."  . It is unknown what precipitated the problem. She has tried nothing for the symptoms. The treatment provided no relief. She has had no prior hospitalizations. She has had no prior ED visits. Associated medical issues do not include asthma.  Reports feeling unwell and this is ongoing for 2 weeks constantly.  During this period she has been seeing her doctor and he has been adjusting her BP medication and has changed this but this has not helped.  She states she feels anxious and is often overcome by feeling hot all over.  No f/c/r.  Patient now reports this started when she cracked her tooth.  She also lost her brother in the past day.    Past Medical History:  Diagnosis Date  . Diabetes mellitus without complication ([place])   . Hypertension     Patient Active Problem List   Diagnosis Date Noted  . Type 2 diabetes mellitus with microalbuminuria, without long-term current use of insulin ([place])  04/28/2016  . Essential hypertension 04/28/2016    Past Surgical History:  Procedure Laterality Date  . ABDOMINAL HYSTERECTOMY    . knee surgery  04/2013    OB History    No data available       Home Medications    Prior to Admission medications   Medication Sig Start Date End Date Taking? Authorizing Provider  amLODipine (NORVASC) 10 MG tablet Take 10 mg by mouth daily. Unknown dose    [provider]  atenolol (TENORMIN) 100 MG tablet Take 100 mg by mouth daily. Unknown dose    [provider]  Dulaglutide (TRULICITY) 1.5 QQ/5.9DG SOPN INJECT INTO THE SKIN WEEKLY. 11/12/16   Philemon Kingdom, MD  estradiol (VIVELLE-DOT) 0.025 MG/24HR Place 1 patch onto the skin 2 (two) times a week. Unknown dose    [provider]  fluticasone (FLONASE) 50 MCG/ACT nasal spray Place 2 sprays into both nostrils daily.    [provider]  glimepiride (AMARYL) 2 MG tablet Please take 1 tablet before b'fast and  before dinner 06/03/17   Philemon Kingdom, MD  glimepiride (AMARYL) 4 MG tablet TAKE 1 TABLET BEFORE BREAKFAST AND 1/2 TABLET BEFORE DINNER 06/30/17   Philemon Kingdom, MD  glucose blood (FREESTYLE TEST STRIPS) test strip Use 2x a day 02/24/17   Philemon Kingdom, MD  lisinopril (PRINIVIL,ZESTRIL) 10 MG tablet Take 10 mg by mouth daily. Unknown dose    [provider]  lisinopril-hydrochlorothiazide (PRINZIDE,ZESTORETIC) 20-25 MG tablet  02/11/16   [provider]  metFORMIN (GLUCOPHAGE-XR) 500 MG 24 hr tablet Take 1 tablet (500 mg total) by mouth 2 (two) times daily with a meal. 11/12/16   Philemon Kingdom, MD  montelukast (SINGULAIR) 10 MG tablet Take 10 mg by mouth at bedtime. Unknown dose    [provider]    Family History History reviewed. No pertinent family history.  Social History Social History   Tobacco Use  . Smoking status: Never Smoker  . Smokeless tobacco: Never Used  Substance Use Topics  . Alcohol use: No     Frequency: Never  . Drug use: No     Allergies   Sulfa antibiotics   Review of Systems Review of Systems  Constitutional: Negative for diaphoresis and fever.  HENT: Negative for ear pain, rhinorrhea and sore throat.   Eyes: Negative for photophobia.  Respiratory: Positive for shortness of breath. Negative for cough, hemoptysis, sputum production and wheezing.   Cardiovascular: Negative for chest pain, palpitations, orthopnea, claudication, leg swelling, syncope and PND.  Gastrointestinal: Negative for abdominal pain and vomiting.  Musculoskeletal: Negative for back pain and neck pain.  Skin: Negative for rash.  Neurological: Negative for tremors, syncope, facial asymmetry, speech difficulty, weakness, numbness and headaches.  Psychiatric/Behavioral: The patient is nervous/anxious.   All other systems reviewed and are negative.    Physical Exam Updated Vital Signs BP (!) 170/75 (BP Location: Right Arm)   Pulse 86   Temp (!) 97.4 F (36.3 C) (Oral)   Resp (!) 24   Ht 5\' 2"  (1.575 m)   Wt 72.1 kg (159 lb)   SpO2 100%   BMI 29.08 kg/m   Physical Exam  Constitutional: She is oriented to person, place, and time. She appears well-developed and well-nourished. No distress.  HENT:  Head: Normocephalic and atraumatic.  Mouth/Throat: No oropharyngeal exudate.  Eyes: Conjunctivae are normal. Pupils are equal, round, and reactive to light.  Neck: Normal range of motion. Neck supple. No JVD present.  Cardiovascular: Normal rate, regular rhythm, normal heart sounds and intact distal pulses.  Pulmonary/Chest: Effort normal and breath sounds normal. No stridor. She has no wheezes. She has no rales.  Abdominal: Soft. Bowel sounds are normal. She exhibits no mass. There is no tenderness. There is no rebound and no guarding.  Musculoskeletal: Normal range of motion. She exhibits no edema or tenderness.  Neurological: She is alert and oriented to person, place, and time. She displays  normal reflexes.  Skin: Skin is warm and dry. Capillary refill takes less than 2 seconds.  Psychiatric: Her mood appears anxious.     ED Treatments / Results  Labs (all labs ordered are listed, but only abnormal results are displayed) Results for orders placed or performed during the hospital encounter of 09/06/17  CBC with Differential/Platelet  Result Value Ref Range   WBC 12.9 (H) 4.0 - 10.5 K/uL   RBC 4.89 3.87 - 5.11 MIL/uL   Hemoglobin 12.4 12.0 - 15.0 g/dL   HCT 37.9 36.0 - 46.0 %   MCV 77.5 (L) 78.0 - 100.0 fL   MCH 25.4 (L) 26.0 - 34.0 pg   MCHC 32.7 30.0 - 36.0 g/dL   RDW 15.2 11.5 - 15.5 %   Platelets 536 (H) 150 - 400 K/uL   Neutrophils Relative % 65 %   Neutro Abs 8.5 (H) 1.7 - 7.7 K/uL   Lymphocytes Relative 28 %   Lymphs Abs 3.6 0.7 - 4.0 K/uL   Monocytes Relative 5 %  Monocytes Absolute 0.6 0.1 - 1.0 K/uL   Eosinophils Relative 1 %   Eosinophils Absolute 0.1 0.0 - 0.7 K/uL   Basophils Relative 1 %   Basophils Absolute 0.1 0.0 - 0.1 K/uL  D-dimer, quantitative (not at [facility])  Result Value Ref Range   D-Dimer, Quant 0.76 (H) 0.00 - 0.50 ug/mL-FEU  CBG monitoring, ED  Result Value Ref Range   Glucose-Capillary 142 (H) 65 - 99 mg/dL  I-Stat Chem 8, ED  Result Value Ref Range   Sodium 142 135 - 145 mmol/L   Potassium 4.2 3.5 - 5.1 mmol/L   Chloride 103 101 - 111 mmol/L   BUN 18 6 - 20 mg/dL   Creatinine, Ser 1.00 0.44 - 1.00 mg/dL   Glucose, Bld 137 (H) 65 - 99 mg/dL   Calcium, Ion 1.03 (L) 1.15 - 1.40 mmol/L   TCO2 26 22 - 32 mmol/L   Hemoglobin 13.9 12.0 - 15.0 g/dL   HCT 41.0 36.0 - 46.0 %  I-stat troponin, ED  Result Value Ref Range   Troponin i, poc 0.00 0.00 - 0.08 ng/mL   Comment 3           Dg Chest 2 View  Result Date: 09/06/2017 CLINICAL DATA:  Acute onset of shortness of breath and generalized chest tightness. EXAM: CHEST  2 VIEW COMPARISON:  None. FINDINGS: The lungs are well-aerated and clear. There is no evidence of focal opacification,  pleural effusion or pneumothorax. The heart is normal in size; the mediastinal contour is within normal limits. No acute osseous abnormalities are seen. Clips are noted within the right upper quadrant, reflecting prior cholecystectomy. IMPRESSION: No acute cardiopulmonary process seen. Electronically Signed   By: Garald Balding M.D.   On: 09/06/2017 23:27   Ct Head Wo Contrast  Result Date: 09/07/2017 CLINICAL DATA:  Acute onset of numbness and tingling in the hands. EXAM: CT HEAD WITHOUT CONTRAST TECHNIQUE: Contiguous axial images were obtained from the base of the skull through the vertex without intravenous contrast. COMPARISON:  None. FINDINGS: Brain: No evidence of acute infarction, hemorrhage, hydrocephalus, extra-axial collection or mass lesion/mass effect. Mild periventricular matter change likely reflects small vessel ischemic microangiopathy. The posterior fossa, including the cerebellum, brainstem and fourth ventricle, is within normal limits. The third and lateral ventricles, and basal ganglia are unremarkable in appearance. The cerebral hemispheres are symmetric in appearance, with normal gray-white differentiation. No mass effect or midline shift is seen. Vascular: No hyperdense vessel or unexpected calcification. Skull: There is no evidence of fracture; visualized osseous structures are unremarkable in appearance. Sinuses/Orbits: The visualized portions of the orbits are within normal limits. Mucosal thickening is noted at the left maxillary sinus. The remaining paranasal sinuses and mastoid air cells are well-aerated. Other: No significant soft tissue abnormalities are seen. IMPRESSION: 1. No acute intracranial pathology seen on CT. 2. Mild small vessel ischemic microangiopathy. 3. Mucosal thickening at the left maxillary sinus. Electronically Signed   By: Garald Balding M.D.   On: 09/07/2017 01:31   Ct Angio Chest Pe W And/or Wo Contrast  Result Date: 09/07/2017 CLINICAL DATA:  Acute onset  of shortness of breath, numbness and tingling in the hands, leukocytosis and mildly elevated D-dimer. EXAM: CT ANGIOGRAPHY CHEST WITH CONTRAST TECHNIQUE: Multidetector CT imaging of the chest was performed using the standard protocol during bolus administration of intravenous contrast. Multiplanar CT image reconstructions and MIPs were obtained to evaluate the vascular anatomy. CONTRAST:  100 mL of Isovue 370 IV contrast COMPARISON:  Chest radiograph performed 09/06/2017 FINDINGS: Cardiovascular: There is no evidence of significant pulmonary embolus. The heart is normal in size. Minimal calcification is seen along the aortic arch. The great vessels are unremarkable in appearance. Mediastinum/Nodes: No mediastinal lymphadenopathy is seen. No pericardial effusion is identified. The visualized portions of the thyroid gland are unremarkable. No axillary lymphadenopathy is seen. Lungs/Pleura: The lungs are clear bilaterally. No focal consolidation, pleural effusion or pneumothorax is seen. No masses are identified. Mild scarring is noted at the periphery of the right lung. Upper Abdomen: The visualized portions of the liver and spleen are unremarkable, aside from minimal calcification within the spleen. The patient is status post cholecystectomy, with clips noted at the gallbladder fossa. The visualized portions of the pancreas, adrenal glands and left kidney are within normal limits. Musculoskeletal: No acute osseous abnormalities are identified. The visualized musculature is unremarkable in appearance. Review of the MIP images confirms the above findings. IMPRESSION: 1. No evidence of significant pulmonary embolus. 2. Lungs clear bilaterally. Electronically Signed   By: Garald Balding M.D.   On: 09/07/2017 02:01    EKG  EKG Interpretation  Date/Time:  Monday September 06 2017 23:27:36 EST Ventricular Rate:  94 PR Interval:    QRS Duration: 90 QT Interval:  376 QTC Calculation: 471 R Axis:   -71 Text  Interpretation:  Sinus rhythm  Baseline wander in lead(s) V2 Confirmed by Randal Buba, Kymari Nuon ([id number]) on 09/06/2017 11:57:43 PM       Updated patient and husband on results and that Dddimer was still pending.    Patient is hyperventilating and visibly panicking in the room   Procedures Procedures (including critical care time)  Medications Ordered in ED  Medications  sodium chloride 0.9 % bolus 500 mL (500 mLs Intravenous New Bag/Given 09/07/17 0028)    1233 am contacted lab regarding Dddimer delay.  They state first specimen was hemolyzed and they just received the recollect  1255 ddimer is positive and we must proceed with CT angio this was ordered immediately in responsive to the results    Patient was again updated on the results.  She is hyperventilating in the room and I suspect this is all anxiety.  I have explained at length on multiple visits with the patient that we have would and now have ruled out life threatening causes of her symptoms.  I would advise following up with PMD to speak at length about her ongoing symptoms and potential for therapy and or medication for her symptoms.    Final Clinical Impressions(s) / ED Diagnoses  Ruled out for MI and PE in the ED.  No signs of stroke.  Exam and vitals are benign and reassuring and patient is stable for discharge.   Return for weakness, numbness, changes in vision or speech,  fevers > 100.4 unrelieved by medication, shortness of breath, intractable vomiting, or diarrhea, abdominal pain, Inability to tolerate liquids or food, cough, altered mental status or any concerns. No signs of systemic illness or infection. The patient is nontoxic-appearing on exam and vital signs are within normal limits.    I have reviewed the triage vital signs and the nursing notes. Pertinent labs &imaging results that were available during my care of the patient were reviewed by me and considered in my medical decision making (see chart for  details).  After history, exam, and medical workup I feel the patient has been appropriately medically screened and is safe for discharge home. Pertinent diagnoses were discussed with the patient. Patient was  given return precautions.   Chandi Nicklin, MD 09/07/17 2103

## 2017-09-06 NOTE — ED Triage Notes (Signed)
Pt complains of being short of breath and very jittery this week, she has been seen by her primary doctor and her blood pressure medication was changed Pt says that her hands ar numb and tingling

## 2017-09-07 ENCOUNTER — Other Ambulatory Visit (HOSPITAL_COMMUNITY): Payer: Medicare Other

## 2017-09-07 ENCOUNTER — Emergency Department (HOSPITAL_COMMUNITY): Payer: Medicare Other

## 2017-09-07 ENCOUNTER — Encounter (HOSPITAL_COMMUNITY): Payer: Self-pay

## 2017-09-07 ENCOUNTER — Other Ambulatory Visit: Payer: Self-pay

## 2017-09-07 LAB — I-STAT TROPONIN, ED: TROPONIN I, POC: 0.01 ng/mL (ref 0.00–0.08)

## 2017-09-07 LAB — D-DIMER, QUANTITATIVE (NOT AT ARMC): D DIMER QUANT: 0.76 ug{FEU}/mL — AB (ref 0.00–0.50)

## 2017-09-07 MED ORDER — IOPAMIDOL (ISOVUE-370) INJECTION 76%
100.0000 mL | Freq: Once | INTRAVENOUS | Status: AC | PRN
  Administered 2017-09-07: 100 mL via INTRAVENOUS

## 2017-09-07 MED ORDER — DIPHENHYDRAMINE HCL 25 MG PO CAPS
25.0000 mg | ORAL_CAPSULE | Freq: Once | ORAL | Status: AC
  Administered 2017-09-07: 25 mg via ORAL
  Filled 2017-09-07: qty 1

## 2017-09-07 MED ORDER — SODIUM CHLORIDE 0.9 % IV BOLUS (SEPSIS)
500.0000 mL | Freq: Once | INTRAVENOUS | Status: AC
  Administered 2017-09-07: 500 mL via INTRAVENOUS

## 2017-09-07 MED ORDER — IOPAMIDOL (ISOVUE-370) INJECTION 76%
INTRAVENOUS | Status: AC
  Administered 2017-09-07: 100 mL via INTRAVENOUS
  Filled 2017-09-07: qty 100

## 2017-09-07 NOTE — ED Notes (Signed)
Unable to collect labs patient wants lab pulled from her IV.

## 2017-09-07 NOTE — ED Notes (Signed)
Dr. Palumbo at bedside. 

## 2017-09-07 NOTE — ED Notes (Signed)
Pt given water after Dr.Palumbo confirmed CT scan was negative.

## 2017-09-30 ENCOUNTER — Ambulatory Visit: Payer: Medicare Other | Admitting: Internal Medicine

## 2017-10-04 ENCOUNTER — Ambulatory Visit: Payer: Medicare Other | Admitting: Internal Medicine

## 2017-10-07 ENCOUNTER — Encounter: Payer: Self-pay | Admitting: Internal Medicine

## 2017-10-07 ENCOUNTER — Ambulatory Visit: Payer: Medicare Other | Admitting: Internal Medicine

## 2017-10-07 VITALS — BP 134/66 | HR 77 | Ht 62.0 in | Wt 159.4 lb

## 2017-10-07 DIAGNOSIS — R809 Proteinuria, unspecified: Secondary | ICD-10-CM | POA: Diagnosis not present

## 2017-10-07 DIAGNOSIS — E785 Hyperlipidemia, unspecified: Secondary | ICD-10-CM | POA: Diagnosis not present

## 2017-10-07 DIAGNOSIS — E1129 Type 2 diabetes mellitus with other diabetic kidney complication: Secondary | ICD-10-CM

## 2017-10-07 LAB — POCT GLYCOSYLATED HEMOGLOBIN (HGB A1C): Hemoglobin A1C: 6.7

## 2017-10-07 NOTE — Progress Notes (Signed)
Patient ID: Ann [name], female   DOB: [birth year], 72 y.o.   MRN: [id number]   HPI: Ann [name] is a 72 y.o.-year-old female, initially referred by Dr. Orland Mustard, now returning for f/u for DM2, dx in 2006, non-insulin-dependent, uncontrolled, with complications (microalbuminuria). Last visit 4 months ago. PCP: DR. [name].  She lost her brother last month ([facility]).  She was very stressed, and she had to start the benzodiazepine.  She only took 2 doses of it so far.  She is now feeling better.  Last hemoglobin A1c was: Lab Results  Component Value Date   HGBA1C 6.7 06/03/2017   HGBA1C 6.8 02/24/2017   HGBA1C 7.0 11/12/2016  02/25/2016: 11.2% Previously 9.7% Previously 8.9%  Pt is on a regimen of: - Trulicity 1.5 mg weekly-started 04/2016 - Metformin ER 500 2x a day after meals - Amaryl 4 mg 2x a day before meals >> 4 mg in am and 2 mg in pm She was on Metformin 1000 mg 2x a day >> diarrhea >> stopped. She refused insulin.  Pt checks her sugars 1x a day: - am:  118-135 >> 130-140s, 150s when gout pain >> 104-120 - 2h after b'fast: n/c - before lunch: n/c - 2h after lunch: n/c - before dinner: 102, 168 >> 130-150 >> n/c - 2h after dinner: n/c >> 125-205 >> n/c - bedtime: 150s usually, 162, 180 >> 144-152 >> 140-156 - nighttime: n/c >> 143-179 >> n/c Lowest sugar was 120s >> 104; she has hypoglycemia awareness in the 70s. Highest sugar was 157 >> 156  Glucometer: Freestyle Lite  Pt's meals are: - Breakfast: cereal, boiled egg, juice - Lunch: sandwich, salad - Dinner: meat + salad, potato - Snacks: 3 She is on nutrition in the past.  -No CKD, last BUN/creatinine:  Lab Results  Component Value Date   BUN 18 09/06/2017   Lab Results  Component Value Date   CREATININE 1.00 09/06/2017  06/30/2016: 14/0.92 02/25/2016: 16/0.92, EGFR 60/73. At that time, glucose was 259, with a sodium of 133.   05/28/2015: Urine ACR 45.6 She is on Cozaar.  -  + HL;last set of  lipids: 07/2017: by PCP >> will need to get records No results found for: CHOL, HDL, LDLCALC, LDLDIRECT, TRIG, CHOLHDL 06/30/2016: 163/186/33/92 05/28/2015: 170/173/34/102 She is not on a statin.  - last eye exam was in 05/2017 - no DR.  She has macular degeneration. Dr. Ricki Miller  - [facility].  -Denies numbness and tingling in her feet.  ROS: Constitutional: no weight gain/no weight loss, no fatigue, no subjective hyperthermia, no subjective hypothermia Eyes: no blurry vision, no xerophthalmia ENT: no sore throat, no nodules palpated in throat, no dysphagia, no odynophagia, no hoarseness Cardiovascular: no CP/no SOB/no palpitations/no leg swelling Respiratory: no cough/no SOB/no wheezing Gastrointestinal: no N/no V/no D/no C/no acid reflux Musculoskeletal: no muscle aches/no joint aches Skin: no rashes, no hair loss Neurological: no tremors/no numbness/no tingling/no dizziness  I reviewed pt's medications, allergies, PMH, social hx, family hx, and changes were documented in the history of present illness. Otherwise, unchanged from my initial visit note. On Clonazepam as needed for anxiety after the death of her brother.   Past Medical History:  Diagnosis Date  . Diabetes mellitus without complication ([place])   . Hypertension    Past Surgical History:  Procedure Laterality Date  . ABDOMINAL HYSTERECTOMY    . knee surgery  04/2013   Social History   Social History  . Marital status: Married  Spouse name: N/A  . Number of children: 3   Occupational History  . Retired Tourist information centre manager   Social History Main Topics  . Smoking status: Never Smoker  . Smokeless tobacco: No  . Alcohol use No  . Drug use: no   Current Outpatient Medications on File Prior to Visit  Medication Sig Dispense Refill  . allopurinol (ZYLOPRIM) 100 MG tablet Take 200 mg by mouth at bedtime.     Marland Kitchen amLODipine (NORVASC) 10 MG tablet Take 10 mg by mouth daily. Unknown dose    . atenolol (TENORMIN)  50 MG tablet Take 50 mg by mouth daily. Unknown dose     . Dulaglutide (TRULICITY) 1.5 LT/9.0ZE SOPN INJECT INTO THE SKIN WEEKLY. 12 pen 3  . glimepiride (AMARYL) 2 MG tablet Please take 1 tablet before b'fast and  before dinner (Patient not taking: Reported on 09/06/2017)    . glimepiride (AMARYL) 4 MG tablet TAKE 1 TABLET BEFORE BREAKFAST AND 1/2 TABLET BEFORE DINNER 135 tablet 1  . glucose blood (FREESTYLE TEST STRIPS) test strip Use 2x a day 100 each 11  . losartan (COZAAR) 50 MG tablet Take 50 mg by mouth daily.    . metFORMIN (GLUCOPHAGE-XR) 500 MG 24 hr tablet Take 1 tablet (500 mg total) by mouth 2 (two) times daily with a meal. 180 tablet 3  . montelukast (SINGULAIR) 10 MG tablet Take 10 mg by mouth at bedtime. Unknown dose    . zolpidem (AMBIEN) 10 MG tablet Take 10 mg by mouth at bedtime as needed for sleep.     No current facility-administered medications on file prior to visit.    Allergies  Allergen Reactions  . Sulfa Antibiotics      FH: - Diabetes in mother, sister, brother - Hypertension in mother and sister - Hyperlipidemia in brother  PE: BP 134/66 (BP Location: Right Arm, Patient Position: Sitting, Cuff Size: Large)   Pulse 77   Ht _0  (1.575 m)   Wt 159 lb 6.4 oz (72.3 kg)   SpO2 99%   BMI 29.15 kg/m  Wt Readings from Last 3 Encounters:  10/07/17 159 lb 6.4 oz (72.3 kg)  09/06/17 159 lb (72.1 kg)  06/03/17 159 lb (72.1 kg)   Constitutional: Normal weight, in NAD Eyes: PERRLA, EOMI, no exophthalmos ENT: moist mucous membranes, no thyromegaly, no cervical lymphadenopathy Cardiovascular: RRR, No MRG Respiratory: CTA B Gastrointestinal: abdomen soft, NT, ND, BS+ Musculoskeletal: no deformities, strength intact in all 4 Skin: moist, warm, no rashes Neurological: no tremor with outstretched hands, DTR normal in all 4  ASSESSMENT: 1. DM2, non-insulin-dependent, uncontrolled, with complications - microalbuminuria  2. HL  PLAN:  1. Patient with  long-standing, previously uncontrolled diabetes, but with much better control after addition of Trulicity.  At last visit, her HbA1c was better, at 6.7% and we decreased her glimepiride dose.  I advised her to  only take the 4 mg tablet if she had a larger meal.  Metformin gave her diarrhea, but only occasionally and usually only if she took 2 tablets at the same time.  For now, she continues on 500 mg twice a day. - At this visit, her sugars are great, improved in a.m. compared to last visit.  At last visit, I advised her to decrease the glimepiride to 2 mg twice a day, but she forgot to do this.  She is still on 4 mg in a.m. and 2 mg in p.m.  She is not checking sugars midday to see if  we can decrease the a.m. dose of glimepiride, therefore, we will continue the current dose for now but I did advise her to check some sugars after breakfast or before lunch before next visit - I suggested to:  Patient Instructions  Please continue: - Metformin ER 500 mg 2x a day with meals - Trulicity 1.5 mg weekly in am - Glimepiride 4 mg in am and 2 mg in pm.  Please check some sugars after b'fast or before lunch before next visit.  Please return in 4 months with your sugar log.   - today, HbA1c is 6.7% (better) - continue checking sugars at different times of the day - check 1x a day, rotating checks - advised for yearly eye exams >> she is UTD - Return to clinic in 4 mo with sugar log   2. HL -Reviewed latest lipid panel from 2017: High triglycerides, low HDL, LDL at goal, lower than 100.  She had another check in 07/2017 and was told that this was worse (I did obtain the record after the visit - see below). She will have a repeat soon - has appt with PCP tomorrow -She is not on a statin  Lipid panel 07/30/2017: 154/240/32/74  Philemon Kingdom, MD PhD [facility] Endocrinology

## 2017-10-07 NOTE — Patient Instructions (Signed)
Please continue: - Metformin ER 500 mg 2x a day with meals - Trulicity 1.5 mg weekly in am - Glimepiride 4 mg in am and 2 mg in pm.  Please check some sugars after b'fast or before lunch before next visit.  Please return in 4 months with your sugar log.

## 2017-10-21 ENCOUNTER — Other Ambulatory Visit: Payer: Self-pay | Admitting: Internal Medicine

## 2018-01-02 ENCOUNTER — Other Ambulatory Visit: Payer: Self-pay | Admitting: Internal Medicine

## 2018-01-10 ENCOUNTER — Other Ambulatory Visit: Payer: Self-pay | Admitting: Internal Medicine

## 2018-02-01 ENCOUNTER — Ambulatory Visit: Payer: Medicare Other | Admitting: Internal Medicine

## 2018-02-01 DIAGNOSIS — Z0289 Encounter for other administrative examinations: Secondary | ICD-10-CM

## 2018-02-01 NOTE — Progress Notes (Deleted)
Patient ID: Ann [name], female   DOB: 04/12/1946, 72 y.o.   MRN: [id number]   HPI: Ann [name] is a 72 y.o.-year-old female, initially referred by Dr. Orland Mustard, now returning for f/u for DM2, dx in 2006, non-insulin-dependent, uncontrolled, with complications (microalbuminuria). Last visit 4 months ago. PCP: DR. [name].  Last hemoglobin A1c was: Lab Results  Component Value Date   HGBA1C 6.7 10/07/2017   HGBA1C 6.7 06/03/2017   HGBA1C 6.8 02/24/2017  02/25/2016: 11.2% Previously 9.7% Previously 8.9%  Pt is on a regimen of: - Trulicity  1.5 mg weekly-started 04/2016 - Metformin ER 500 2x a day after meals - Amaryl 4 mg 2x a day before meals >> 4 mg in am and 2 mg in pm She was on Metformin 1000 mg 2x a day >> diarrhea >> decreased dose She refused insulin.  Pt checks her sugars 1X a day: - am:  1130-140s, 150s >> 104-120 - 2h after b'fast: n/c - before lunch: n/c - 2h after lunch: n/c - before dinner: 102, 168 >> 130-150 >> n/c - 2h after dinner: n/c >> 125-205 >> n/c - bedtime:  144-152 >> 140-156 - nighttime: n/c >> 143-179 >> n/c Lowest sugar was 120s >> 104 >> ***; she has hypoglycemia awareness  in the 70s. Highest sugar was 157 >> 156 >> ***  Glucometer: Freestyle Lite  Pt's meals are: - Breakfast: cereal, boiled egg, juice - Lunch: sandwich, salad - Dinner: meat + salad, potato - Snacks: 3 She is on nutrition in the past.  -+ Mild CKD (with microalbuminuria), last BUN/creatinine:  Lab Results  Component Value Date   BUN 18 09/06/2017   Lab Results  Component Value Date   CREATININE 1.00 09/06/2017  06/30/2016: 14/0.92 02/25/2016: 16/0.92, EGFR 60/73. At that time, glucose was 259, with a sodium of 133.   05/28/2015: Urine ACR 45.6 On Cozaar.  -  + HL;last set of lipids: 07/2017: 154/240/32/74 No results found for: CHOL, HDL, LDLCALC, LDLDIRECT, TRIG, CHOLHDL 06/30/2016: 163/186/33/92 05/28/2015: 170/173/34/102 She is not on a  statin  - last eye exam was in 05/2017: No DR. + macular degeneration. Dr. Ricki Miller  - [facility].  -No numbness and tingling in her feet.  ROS: Constitutional: no weight gain/no weight loss, no fatigue, no subjective hyperthermia, no subjective hypothermia Eyes: no blurry vision, no xerophthalmia ENT: no sore throat, no nodules palpated in throat, no dysphagia, no odynophagia, no hoarseness Cardiovascular: no CP/no SOB/no palpitations/no leg swelling Respiratory: no cough/no SOB/no wheezing Gastrointestinal: no N/no V/no D/no C/no acid reflux Musculoskeletal: no muscle aches/no joint aches Skin: no rashes, no hair loss Neurological: no tremors/no numbness/no tingling/no dizziness  I reviewed pt's medications, allergies, PMH, social hx, family hx, and changes were documented in the history of present illness. Otherwise, unchanged from my initial visit note.   Past Medical History:  Diagnosis Date  . Diabetes mellitus without complication ([place])   . Hypertension    Past Surgical History:  Procedure Laterality Date  . ABDOMINAL HYSTERECTOMY    . knee surgery  04/2013   Social History   Social History  . Marital status: Married    Spouse name: N/A  . Number of children: 3   Occupational History  . Retired Tourist information centre manager   Social History Main Topics  . Smoking status: Never Smoker  . Smokeless tobacco: No  . Alcohol use No  . Drug use: no   Current Outpatient Medications on File Prior to Visit  Medication Sig Dispense  Refill  . allopurinol (ZYLOPRIM) 100 MG tablet Take 200 mg by mouth at bedtime.     Marland Kitchen amLODipine (NORVASC) 10 MG tablet Take 10 mg by mouth daily. Unknown dose    . atenolol (TENORMIN) 50 MG tablet Take 50 mg by mouth daily. Unknown dose     . glimepiride (AMARYL) 2 MG tablet Please take 1 tablet before b'fast and  before dinner (Patient not taking: Reported on 09/06/2017)    . glimepiride (AMARYL) 4 MG tablet TAKE 1 TABLET BEFORE BREAKFAST AND 1/2  TABLET BEFORE DINNER 135 tablet 1  . glucose blood (FREESTYLE TEST STRIPS) test strip Use 2x a day 100 each 11  . losartan (COZAAR) 50 MG tablet Take 50 mg by mouth daily.    . metFORMIN (GLUCOPHAGE-XR) 500 MG 24 hr tablet TAKE 1 TABLET (500 MG TOTAL) BY MOUTH 2 (TWO) TIMES DAILY WITH A MEAL. 180 tablet 2  . montelukast (SINGULAIR) 10 MG tablet Take 10 mg by mouth at bedtime. Unknown dose    . TRULICITY 1.5 OV/5.6EP SOPN INJECT INTO THE SKIN WEEKLY. 6 pen 3  . zolpidem (AMBIEN) 10 MG tablet Take 10 mg by mouth at bedtime as needed for sleep.     No current facility-administered medications on file prior to visit.    Allergies  Allergen Reactions  . Sulfa Antibiotics      FH: - Diabetes in mother, sister, brother - Hypertension in mother and sister - Hyperlipidemia in brother  PE: There were no vitals taken for this visit. Wt Readings from Last 3 Encounters:  10/07/17 159 lb 6.4 oz (72.3 kg)  09/06/17 159 lb (72.1 kg)  06/03/17 159 lb (72.1 kg)   Constitutional: Normal weight, in NAD Eyes: PERRLA, EOMI, no exophthalmos ENT: moist mucous membranes, no thyromegaly, no cervical lymphadenopathy Cardiovascular: RRR, No MRG Respiratory: CTA B Gastrointestinal: abdomen soft, NT, ND, BS+ Musculoskeletal: no deformities, strength intact in all 4 Skin: moist, warm, no rashes Neurological: no tremor with outstretched hands, DTR normal in all 4  ASSESSMENT: 1. DM2, non-insulin-dependent, uncontrolled, with complications - microalbuminuria  2. HL  PLAN:  1. Patient with long-standing, previously uncontrolled diabetes, but with better control after addition of Trulicity.  Last HbA1c was at goal, is 6.7%.  She had intolerance to metformin in the past if using 1000 mg at one time.  Therefore, she continues on 500 mg twice a day.  At last visit, sugars were great, improved in the morning compared to the previous visit.  She was not checking sugars midday and I advised her to do so to see if  we can decrease the glimepiride dose from 4 mg to 2 mg in the morning, also.  She is already taking 2 mg before dinner.  - I suggested to:  Patient Instructions  Please continue: - Metformin ER 500 mg 2x a day with meals - Trulicity 1.5 mg weekly in am - Glimepiride 4 mg in am and 2 mg in pm.  Please return in 4 months with your sugar log.   - today, HbA1c is 7%  - continue checking sugars at different times of the day - check 1x a day, rotating checks - advised for yearly eye exams >> she is UTD - Return to clinic in 4 mo with sugar log   2. HL - Reviewed latest lipid panel from 07/2017: High triglycerides and low HDL, but LDL at goal. -  she is not on a statin.  Philemon Kingdom, MD PhD [facility] Endocrinology

## 2018-03-15 ENCOUNTER — Ambulatory Visit: Payer: Medicare Other | Admitting: Internal Medicine

## 2018-03-15 ENCOUNTER — Encounter: Payer: Self-pay | Admitting: Internal Medicine

## 2018-03-15 VITALS — BP 122/78 | HR 69 | Ht 62.0 in | Wt 161.0 lb

## 2018-03-15 DIAGNOSIS — E785 Hyperlipidemia, unspecified: Secondary | ICD-10-CM

## 2018-03-15 DIAGNOSIS — E1129 Type 2 diabetes mellitus with other diabetic kidney complication: Secondary | ICD-10-CM

## 2018-03-15 DIAGNOSIS — R809 Proteinuria, unspecified: Secondary | ICD-10-CM

## 2018-03-15 LAB — POCT GLYCOSYLATED HEMOGLOBIN (HGB A1C): Hemoglobin A1C: 7.9 % — AB (ref 4.0–5.6)

## 2018-03-15 MED ORDER — DULAGLUTIDE 1.5 MG/0.5ML ~~LOC~~ SOAJ: SUBCUTANEOUS | 3 refills | Status: DC

## 2018-03-15 NOTE — Addendum Note (Signed)
Addended by: Drucilla Schmidt on: 03/15/2018 03:43 PM   Modules accepted: Orders

## 2018-03-15 NOTE — Progress Notes (Signed)
Patient ID: Ann [name], female   DOB: May 16, 1946, 72 y.o.   MRN: [id number]   HPI: Ann [name] is a 72 y.o.-year-old female, initially referred by Dr. Orland Mustard, now returning for f/u for DM2, dx in 2006, non-insulin-dependent, uncontrolled, with complications (microalbuminuria). Last visit 5 months ago. PCP: DR. [name].  She had a lot of stress since last visit: death of her brother, major dental work.  She relaxed her diet and her sugars are higher.  Last hemoglobin A1c was: Lab Results  Component Value Date   HGBA1C 6.7 10/07/2017   HGBA1C 6.7 06/03/2017   HGBA1C 6.8 02/24/2017  02/25/2016: 11.2% Previously 9.7% Previously 8.9%  Pt is on a regimen of: - Trulicity 1.5 mg weekly-started 04/2016  - no SEs - Metformin ER 500 2x a day after meals >> may skip the am dose b/c diarrhea midday - Amaryl 4 mg 2x a day before meals >> 4 mg in am and 2 mg in pm She was on Metformin 1000 mg 2x a day >> diarrhea >> stopped. She refused insulin.  Pt checks her sugars once a day: - am:  130-140s, 150s >> 104-120 >> 120-150s - 2h after b'fast: n/c - before lunch: n/c - 2h after lunch: n/c - before dinner: 102, 168 >> 130-150 >> n/c - 2h after dinner: n/c >> 125-205 >> n/c - bedtime: 144-152 >> 140-156 >> 130s-160s, 201 - nighttime: n/c >> 143-179 >> n/c Lowest sugar was 120s >> 104 >> 124; she has hypoglycemia awareness in the 70s. Highest sugar was 157 >> 156 >> 201.  Glucometer: Freestyle Lite  Pt's meals are: - Breakfast: cereal, boiled egg, juice - Lunch: sandwich, salad - Dinner: meat + salad, potato - Snacks: 3 She saw nutrition in the past.  -No CKD, last BUN/creatinine:  Lab Results  Component Value Date   BUN 18 09/06/2017   Lab Results  Component Value Date   CREATININE 1.00 09/06/2017  06/30/2016: 14/0.92 02/25/2016: 16/0.92, EGFR 60/73. At that time, glucose was 259, with a sodium of 133.   05/28/2015: Urine ACR 45.6 On Cozaar.  -  + HL;last set  of lipids: 07/30/2017: 154/240/32/74 No results found for: CHOL, HDL, LDLCALC, LDLDIRECT, TRIG, CHOLHDL 06/30/2016: 163/186/33/92 05/28/2015: 170/173/34/102  She started on a statin in 01/2018 (she will let me know the drug name)  - last eye exam was in Spring 2019: No DR. + macular degeneration. Dr. Ricki Miller  - [facility].  - no numbness and tingling in her feet.  ROS: Constitutional: no weight gain/no weight loss, no fatigue, no subjective hyperthermia, no subjective hypothermia Eyes: no blurry vision, no xerophthalmia ENT: no sore throat, no nodules palpated in throat, no dysphagia, no odynophagia, no hoarseness Cardiovascular: no CP/no SOB/no palpitations/no leg swelling Respiratory: no cough/no SOB/no wheezing Gastrointestinal: no N/no V/no D/no C/no acid reflux Musculoskeletal: no muscle aches/+ joint aches (thumb) Skin: no rashes, no hair loss Neurological: no tremors/no numbness/no tingling/no dizziness  I reviewed pt's medications, allergies, PMH, social hx, family hx, and changes were documented in the history of present illness. Otherwise, unchanged from my initial visit note.   Past Medical History:  Diagnosis Date  . Diabetes mellitus without complication ([place])   . Hypertension    Past Surgical History:  Procedure Laterality Date  . ABDOMINAL HYSTERECTOMY    . knee surgery  04/2013   Social History   Social History  . Marital status: Married    Spouse name: N/A  . Number of children:  3   Occupational History  . Retired Tourist information centre manager   Social History Main Topics  . Smoking status: Never Smoker  . Smokeless tobacco: No  . Alcohol use No  . Drug use: no   Current Outpatient Medications on File Prior to Visit  Medication Sig Dispense Refill  . allopurinol (ZYLOPRIM) 100 MG tablet Take 200 mg by mouth at bedtime.     Marland Kitchen amLODipine (NORVASC) 10 MG tablet Take 10 mg by mouth daily. Unknown dose    . atenolol (TENORMIN) 50 MG tablet Take 50 mg by mouth  daily. Unknown dose     . glimepiride (AMARYL) 2 MG tablet Please take 1 tablet before b'fast and  before dinner (Patient not taking: Reported on 09/06/2017)    . glimepiride (AMARYL) 4 MG tablet TAKE 1 TABLET BEFORE BREAKFAST AND 1/2 TABLET BEFORE DINNER 135 tablet 1  . glucose blood (FREESTYLE TEST STRIPS) test strip Use 2x a day 100 each 11  . losartan (COZAAR) 50 MG tablet Take 50 mg by mouth daily.    . metFORMIN (GLUCOPHAGE-XR) 500 MG 24 hr tablet TAKE 1 TABLET (500 MG TOTAL) BY MOUTH 2 (TWO) TIMES DAILY WITH A MEAL. 180 tablet 2  . montelukast (SINGULAIR) 10 MG tablet Take 10 mg by mouth at bedtime. Unknown dose    . TRULICITY 1.5 OQ/9.4TM SOPN INJECT INTO THE SKIN WEEKLY. 6 pen 3  . zolpidem (AMBIEN) 10 MG tablet Take 10 mg by mouth at bedtime as needed for sleep.     No current facility-administered medications on file prior to visit.    Allergies  Allergen Reactions  . Sulfa Antibiotics      FH: - Diabetes in mother, sister, brother - Hypertension in mother and sister - Hyperlipidemia in brother  PE: BP 122/78   Pulse 69   Ht _0  (1.575 m)   Wt 161 lb (73 kg)   SpO2 98%   BMI 29.45 kg/m  Wt Readings from Last 3 Encounters:  03/15/18 161 lb (73 kg)  10/07/17 159 lb 6.4 oz (72.3 kg)  09/06/17 159 lb (72.1 kg)   Constitutional: normal weight, in NAD Eyes: PERRLA, EOMI, no exophthalmos ENT: moist mucous membranes, no thyromegaly, no cervical lymphadenopathy Cardiovascular: RRR, No MRG Respiratory: CTA B Gastrointestinal: abdomen soft, NT, ND, BS+ Musculoskeletal: no deformities, strength intact in all 4 Skin: moist, warm, no rashes Neurological: no tremor with outstretched hands, DTR normal in all 4  ASSESSMENT: 1. DM2, non-insulin-dependent, uncontrolled, with complications - microalbuminuria  2. HL  3. MAU  PLAN:  1. Patient with long-standing, previously uncontrolled diabetes, but with much better control after addition of Trulicity to her regimen  continue metformin and glimepiride.  At last visit, HbA1c improved to 6.7%.   She is taking only one metformin ER at the time due to previously reported diarrhea when she took 2 tablets together. At last visit, sugars were better in the morning and her HbA1c was also better, at 6.7%. I advised her to also check some sugars after breakfast or midday to see if we could decrease the glimepiride dose further to 2 mg twice a day. - At this visit, sugars are higher after she relaxed her diet in the last few months.  She had several stressors but she is ready to get back on track. -She tells me that she occasionally misses morning metformin because of diarrhea midday so we discussed about moving the entire dose with dinner, if she cannot tolerate it. - We will  not make any other changes for now, since she is planning to work on her diet - I suggested to:  Patient Instructions  Please move both Metformin ER doses with dinner (1000 mg).  Please continue: - Trulicity 1.5 mg weekly - Amaryl 4 mg in am and 2 mg before dinner  - today, HbA1c is 7.9% (higher) - continue checking sugars at different times of the day - check 1x a day, rotating checks - advised for yearly eye exams >> she is UTD - Return to clinic in 3 mo with sugar log    2. HL - Reviewed latest lipid panel from 07/2017: 154/240/32/74 -LDL is at goal, but  high triglycerides and low HDL. - She was started on a statin in 01/2018.  She tells me that she did have a cholesterol level checked at that time and we will obtain these records.  3. MAU - She has a history of microalbuminuria - Continues Cozaar - I will need to obtain the records of her latest microalbumin to creatinine ratio from PCP.  Philemon Kingdom, MD PhD [facility] Endocrinology

## 2018-03-15 NOTE — Patient Instructions (Addendum)
Please move both Metformin ER doses with dinner (1000 mg).  Please continue: - Trulicity 1.5 mg weekly - Amaryl 4 mg in am and 2 mg before dinner  Please come back for a follow-up appointment in 3 months.

## 2018-03-25 ENCOUNTER — Encounter: Payer: Self-pay | Admitting: Internal Medicine

## 2018-03-25 NOTE — Progress Notes (Signed)
Received labs from 01/11/2018: CBC with low MCV at 78.7 (81-99) and high platelets at 458 (150-400) CMP with a high glucose of 175.  BUN/creatinine normal 11/0.84, with the rest of the CMP being normal Lipids: 168/156/38/98

## 2018-06-06 ENCOUNTER — Ambulatory Visit: Payer: Medicare Other | Admitting: Internal Medicine

## 2018-06-06 ENCOUNTER — Encounter: Payer: Self-pay | Admitting: Internal Medicine

## 2018-06-06 VITALS — BP 126/70 | HR 78 | Ht 62.0 in | Wt 159.0 lb

## 2018-06-06 DIAGNOSIS — E785 Hyperlipidemia, unspecified: Secondary | ICD-10-CM | POA: Diagnosis not present

## 2018-06-06 DIAGNOSIS — E1129 Type 2 diabetes mellitus with other diabetic kidney complication: Secondary | ICD-10-CM

## 2018-06-06 DIAGNOSIS — R809 Proteinuria, unspecified: Secondary | ICD-10-CM | POA: Diagnosis not present

## 2018-06-06 LAB — POCT GLYCOSYLATED HEMOGLOBIN (HGB A1C): HEMOGLOBIN A1C: 6.5 % — AB (ref 4.0–5.6)

## 2018-06-06 NOTE — Patient Instructions (Signed)
Please continue: - Metformin ER 500 mg 2x a day - Amaryl 4 mg in am and 2 mg before dinner - Trulicity 1.5  mg weekly   Please return in 3-4 months with your sugar log.

## 2018-06-06 NOTE — Progress Notes (Signed)
Patient ID: Ann [name], female   DOB: 20-Aug-1945, 72 y.o.   MRN: [id number]   HPI: Ann [name] is a 72 y.o.-year-old female, initially referred by Dr. Orland Mustard, now returning for f/u for DM2, dx in 2006, non-insulin-dependent, uncontrolled, with complications (microalbuminuria). Last visit 3 months ago. PCP: DR. [name].  She started to change her diet (no sodas, limited portions) >> sugars better.  Last hemoglobin A1c was: Lab Results  Component Value Date   HGBA1C 7.9 (A) 03/15/2018   HGBA1C 6.7 10/07/2017   HGBA1C 6.7 06/03/2017  02/25/2016: 11.2% Previously 9.7% Previously 8.9%  Pt is on a regimen of: - Metformin ER 500 mg 2x a day - Amaryl 4 mg in am and 2 mg before dinner - Trulicity 1.5  mg weekly She was on Metformin 1000 mg 2x a day >> diarrhea >> stopped. She refused insulin.  Pt checks her sugars once a day: - am:   104-120 >> 120-150s >> 117-144 - 2h after b'fast: n/c - before lunch: n/c - 2h after lunch: n/c - before dinner: 102, 168 >> 130-150 >> n/c - 2h after dinner: 125-205 >> n/c >> 137-157, 160 - bedtime:  140-156 >> 130s-160s, 201 >> n/c - nighttime: n/c >> 143-179 >> n/c Lowest sugar was 124 >> 117; she has hypoglycemia awareness in the 70s. Highest sugar was 201 >> 180.  Glucometer: Freestyle Lite  Pt's meals are: - Breakfast: cereal, boiled egg, juice - Lunch: sandwich, salad - Dinner: meat + salad, potato - Snacks: 3 She is on nutrition in the past.  -No CKD, last BUN/creatinine:  01/11/2018: CMP with a high glucose of 175.  BUN/creatinine normal 11/0.84, with the rest of the CMP being normal Lab Results  Component Value Date   BUN 18 09/06/2017   Lab Results  Component Value Date   CREATININE 1.00 09/06/2017  06/30/2016: 14/0.92 02/25/2016: 16/0.92, EGFR 60/73. At that time, glucose was 259, with a sodium of 133.   05/28/2015: Urine ACR 45.6 On Olmesartan now.   + HL;last set of lipids: 01/11/2018:   168/156/38/98 07/30/2017: 154/240/32/74 No results found for: CHOL, HDL, LDLCALC, LDLDIRECT, TRIG, CHOLHDL 06/30/2016: 163/186/33/92 05/28/2015: 170/173/34/102 She started on a statin in 01/2018.  - last eye exam was in spring 2019: No DR, + macular degeneration. Dr. Ricki Miller  - [facility].  - Denies numbness and tingling in her feet.  ROS: Constitutional: no weight gain/no weight loss, no fatigue, no subjective hyperthermia, no subjective hypothermia Eyes: no blurry vision, no xerophthalmia ENT: no sore throat, no nodules palpated in throat, no dysphagia, no odynophagia, no hoarseness Cardiovascular: no CP/no SOB/no palpitations/no leg swelling Respiratory: no cough/no SOB/no wheezing Gastrointestinal: no N/no V/no D/no C/no acid reflux Musculoskeletal: no muscle aches/+ joint aches (thumb) Skin: no rashes, no hair loss Neurological: no tremors/no numbness/no tingling/no dizziness  I reviewed pt's medications, allergies, PMH, social hx, family hx, and changes were documented in the history of present illness. Otherwise, unchanged from my initial visit note.   Past Medical History:  Diagnosis Date  . Diabetes mellitus without complication ([place])   . Hypertension    Past Surgical History:  Procedure Laterality Date  . ABDOMINAL HYSTERECTOMY    . knee surgery  04/2013   Social History   Social History  . Marital status: Married    Spouse name: N/A  . Number of children: 3   Occupational History  . Retired Tourist information centre manager   Social History Main Topics  . Smoking status: Never Smoker  .  Smokeless tobacco: No  . Alcohol use No  . Drug use: no   Current Outpatient Medications on File Prior to Visit  Medication Sig Dispense Refill  . allopurinol (ZYLOPRIM) 100 MG tablet Take 200 mg by mouth at bedtime.     Marland Kitchen amLODipine (NORVASC) 10 MG tablet Take 10 mg by mouth daily. Unknown dose    . atenolol (TENORMIN) 50 MG tablet Take 50 mg by mouth daily. Unknown dose     .  Dulaglutide (TRULICITY) 1.5 FY/1.0FB SOPN INJECT INTO THE SKIN WEEKLY. 12 pen 3  . glimepiride (AMARYL) 4 MG tablet TAKE 1 TABLET BEFORE BREAKFAST AND 1/2 TABLET BEFORE DINNER 135 tablet 1  . glucose blood (FREESTYLE TEST STRIPS) test strip Use 2x a day 100 each 11  . losartan (COZAAR) 50 MG tablet Take 50 mg by mouth daily.    . metFORMIN (GLUCOPHAGE-XR) 500 MG 24 hr tablet TAKE 1 TABLET (500 MG TOTAL) BY MOUTH 2 (TWO) TIMES DAILY WITH A MEAL. 180 tablet 2  . montelukast (SINGULAIR) 10 MG tablet Take 10 mg by mouth at bedtime. Unknown dose    . zolpidem (AMBIEN) 10 MG tablet Take 10 mg by mouth at bedtime as needed for sleep.     No current facility-administered medications on file prior to visit.    Allergies  Allergen Reactions  . Sulfa Antibiotics      FH: - Diabetes in mother, sister, brother - Hypertension in mother and sister - Hyperlipidemia in brother  PE: BP 126/70   Pulse 78   Ht 5' 2" (1.575 m) Comment: measured  Wt 159 lb (72.1 kg)   SpO2 98%   BMI 29.08 kg/m  Wt Readings from Last 3 Encounters:  06/06/18 159 lb (72.1 kg)  03/15/18 161 lb (73 kg)  10/07/17 159 lb 6.4 oz (72.3 kg)   Constitutional: Normal weight, in NAD Eyes: PERRLA, EOMI, no exophthalmos ENT: moist mucous membranes, no thyromegaly, no cervical lymphadenopathy Cardiovascular: RRR, No MRG Respiratory: CTA B Gastrointestinal: abdomen soft, NT, ND, BS+ Musculoskeletal: no deformities, strength intact in all 4 Skin: moist, warm, no rashes Neurological: no tremor with outstretched hands, DTR normal in all 4  ASSESSMENT: 1. DM2, non-insulin-dependent, uncontrolled, with complications - microalbuminuria  2. HL  3. MAU  PLAN:  1. Patient with long-standing, previously uncontrolled diabetes, but with better control after addition of Trulicity to her regimen of metformin and glimepiride.  At last visit, HbA1c was higher after she relaxed her diet.  She was ready to get back on track with the  diet.  She was telling me at that time that she occasionally missed morning metformin because of diarrhea midday so I advised her to move the entire dose with dinner if she could tolerate it -however, she tells me that she did not try to do this.  -At this visit, sugars are mostly at goal after she started to improve her diet by limiting sodas and cutting down portions and snacking. -She feels that her sugars are higher right before the next Trulicity injection.  We did discuss about the possibility of moving this 1 day earlier, but ideally she would make an extra effort with her diet the day before the Trulicity injection.   -If she continues to do so well, we may be able to decrease and even stop Amaryl after the holidays. - I suggested to:  Patient Instructions  Please continue: - Metformin ER 500 mg 2x a day - Amaryl 4 mg in am and  2 mg before dinner - Trulicity 1.5  mg weekly   Please return in 3-4 months with your sugar log.   - today, HbA1c is 6.5% (better) - continue checking sugars at different times of the day - check 1x a day, rotating checks - advised for yearly eye exams >> she is UTD - Return to clinic in 3 mo with sugar log   2. HL - Reviewed lipid panel from 07/2017: 154/240/32/74: LDL at goal, but high triglycerides and low HDL >> started on a statin >> lipids from 12/2017: Improved except for the LDL: 168/156/38/98 - Continues the statin without side effects.  This was started in 01/2018.  3. MAU -She has a history of microalbuminuria -Continues ARB.  This was switched from Cozaar to olmesartan by PCP. -Needs a new ACR.  She has an appointment with PCP soon and would like to have this done in his office. - Latest BUN/creatinine was normal in 12/2017:11/0.84  Philemon Kingdom, MD PhD [facility] Endocrinology

## 2018-06-06 NOTE — Addendum Note (Signed)
Addended by: Cardell Peach I on: 06/06/2018 03:46 PM   Modules accepted: Orders

## 2018-06-16 ENCOUNTER — Encounter: Payer: Self-pay | Admitting: Internal Medicine

## 2018-06-16 NOTE — Progress Notes (Signed)
Received labs from PCP drawn on 01/11/2018: Glucose 175, BUN/creatinine 11/0.84, otherwise CMP normal.  Corrected calcium 9.27. Lipids: 168/156/38/98

## 2018-08-24 ENCOUNTER — Other Ambulatory Visit: Payer: Self-pay | Admitting: Internal Medicine

## 2018-09-26 ENCOUNTER — Other Ambulatory Visit: Payer: Self-pay | Admitting: Internal Medicine

## 2018-09-27 ENCOUNTER — Ambulatory Visit: Payer: Medicare Other | Admitting: Internal Medicine

## 2018-09-30 ENCOUNTER — Encounter: Payer: Self-pay | Admitting: Internal Medicine

## 2018-09-30 ENCOUNTER — Ambulatory Visit: Payer: Medicare Other | Admitting: Internal Medicine

## 2018-09-30 VITALS — BP 110/70 | HR 71 | Ht 62.0 in | Wt 161.0 lb

## 2018-09-30 DIAGNOSIS — E785 Hyperlipidemia, unspecified: Secondary | ICD-10-CM

## 2018-09-30 DIAGNOSIS — R809 Proteinuria, unspecified: Secondary | ICD-10-CM | POA: Diagnosis not present

## 2018-09-30 DIAGNOSIS — E1129 Type 2 diabetes mellitus with other diabetic kidney complication: Secondary | ICD-10-CM

## 2018-09-30 LAB — MICROALBUMIN / CREATININE URINE RATIO
Creatinine,U: 179.6 mg/dL
MICROALB/CREAT RATIO: 1.7 mg/g (ref 0.0–30.0)
Microalb, Ur: 3.1 mg/dL — ABNORMAL HIGH (ref 0.0–1.9)

## 2018-09-30 LAB — POCT GLYCOSYLATED HEMOGLOBIN (HGB A1C): Hemoglobin A1C: 7.3 % — AB (ref 4.0–5.6)

## 2018-09-30 NOTE — Progress Notes (Signed)
Patient ID: Ann [name], female   DOB: 1945/11/30, 73 y.o.   MRN: [id number]   HPI: Ann [name] is a 73 y.o.-year-old female, initially referred by Dr. Orland Mustard, now returning for f/u for DM2, dx in 2006, non-insulin-dependent, uncontrolled, with complications (microalbuminuria). Last visit 4 months ago. PCP: DR. [name].  Before last visit, she started to change her diet: No sodas, limited portions and sugars improved.  She relaxed her diet over the holidays and did not check very frequently.  Last hemoglobin A1c was: Lab Results  Component Value Date   HGBA1C 6.5 (A) 06/06/2018   HGBA1C 7.9 (A) 03/15/2018   HGBA1C 6.7 10/07/2017  02/25/2016: 11.2% Previously 9.7% Previously 8.9%  Pt is on a regimen of: - Metformin ER 500 mg 2x a day - Amaryl 4 mg in am and 2 mg before dinner - Trulicity 1.5 mg weekly She was on Metformin 1000 mg 2x a day >> diarrhea >> stopped. She refused insulin.  Pt checks her sugars 0-1x a day: - am:   104-120 >> 120-150s >> 117-144 >> 130s - 2h after b'fast: n/c - before lunch: n/c - 2h after lunch: n/c - before dinner: 102, 168 >> 130-150 >> n/c - 2h after dinner:  137-157, 160 >> 130s-150s - bedtime:  140-156 >> 130s-160s, 201 >> n/c - nighttime: n/c >> 143-179 >> n/c Lowest sugar was 124 >> 117 >> 120; she has hypoglycemia awareness in the 70s. Highest sugar was 201 >> 180 >> 150s  Glucometer: Freestyle Lite  Pt's meals are: - Breakfast: cereal, boiled egg, juice - Lunch: sandwich, salad - Dinner: meat + salad, potato - Snacks: 3 She is on nutrition in the past.  -No CKD, last BUN/creatinine:  01/11/2018: CMP with a high glucose of 175.  BUN/creatinine normal 11/0.84, with the rest of the CMP being normal Lab Results  Component Value Date   BUN 18 09/06/2017   Lab Results  Component Value Date   CREATININE 1.00 09/06/2017  06/30/2016: 14/0.92 02/25/2016: 16/0.92, EGFR 60/73. At that time, glucose was 259, with a sodium of  133.   05/28/2015: Urine ACR 45.6 On olmesartan.  + HL;last set of lipids: 01/11/2018:  168/156/38/98 07/30/2017: 154/240/32/74 No results found for: CHOL, HDL, LDLCALC, LDLDIRECT, TRIG, CHOLHDL 06/30/2016: 163/186/33/92 05/28/2015: 170/173/34/102 She started a statin in 01/2018.  - last eye exam was in spring 2019: No DR, + macular degeneration. Dr. Ricki Miller  - [facility].  - no numbness and tingling in her feet.  ROS: Constitutional: no weight gain/no weight loss, no fatigue, no subjective hyperthermia, no subjective hypothermia Eyes: no blurry vision, no xerophthalmia ENT: no sore throat, no nodules palpated in neck, no dysphagia, no odynophagia, no hoarseness Cardiovascular: no CP/no SOB/no palpitations/no leg swelling Respiratory: no cough/no SOB/no wheezing Gastrointestinal: no N/no V/no D/no C/no acid reflux Musculoskeletal: no muscle aches/no joint aches Skin: no rashes, no hair loss Neurological: no tremors/no numbness/no tingling/no dizziness  I reviewed pt's medications, allergies, PMH, social hx, family hx, and changes were documented in the history of present illness. Otherwise, unchanged from my initial visit note.   Past Medical History:  Diagnosis Date  . Diabetes mellitus without complication ([place])   . Hypertension    Past Surgical History:  Procedure Laterality Date  . ABDOMINAL HYSTERECTOMY    . knee surgery  04/2013   Social History   Social History  . Marital status: Married    Spouse name: N/A  . Number of children: 3  Occupational History  . Retired Tourist information centre manager   Social History Main Topics  . Smoking status: Never Smoker  . Smokeless tobacco: No  . Alcohol use No  . Drug use: no   Current Outpatient Medications on File Prior to Visit  Medication Sig Dispense Refill  . allopurinol (ZYLOPRIM) 100 MG tablet Take 200 mg by mouth at bedtime.     Marland Kitchen amLODipine (NORVASC) 10 MG tablet Take 10 mg by mouth daily. Unknown dose    . atenolol  (TENORMIN) 50 MG tablet Take 50 mg by mouth daily. Unknown dose     . glimepiride (AMARYL) 4 MG tablet TAKE 1 TABLET BEFORE BREAKFAST AND 1/2 TABLET BEFORE DINNER 135 tablet 1  . glucose blood (FREESTYLE TEST STRIPS) test strip Use 2x a day 100 each 11  . metFORMIN (GLUCOPHAGE-XR) 500 MG 24 hr tablet TAKE 1 TABLET (500 MG TOTAL) BY MOUTH 2 (TWO) TIMES DAILY WITH A MEAL. 180 tablet 2  . montelukast (SINGULAIR) 10 MG tablet Take 10 mg by mouth at bedtime. Unknown dose    . olmesartan (BENICAR) 20 MG tablet Take 20 mg by mouth daily.  0  . TRULICITY 1.5 GE/9.5MW SOPN INJECT INTO THE SKIN WEEKLY. 6 pen 3  . zolpidem (AMBIEN) 10 MG tablet Take 10 mg by mouth at bedtime as needed for sleep.     No current facility-administered medications on file prior to visit.    Allergies  Allergen Reactions  . Sulfa Antibiotics      FH: - Diabetes in mother, sister, brother - Hypertension in mother and sister - Hyperlipidemia in brother  PE: BP 110/70   Pulse 71   Ht 5' 2"  (1.575 m)   Wt 161 lb (73 kg)   SpO2 99%   BMI 29.45 kg/m  Wt Readings from Last 3 Encounters:  09/30/18 161 lb (73 kg)  06/06/18 159 lb (72.1 kg)  03/15/18 161 lb (73 kg)   Constitutional: Normal weight, in NAD Eyes: PERRLA, EOMI, no exophthalmos ENT: moist mucous membranes, no thyromegaly, no cervical lymphadenopathy Cardiovascular: RRR, No MRG Respiratory: CTA B Gastrointestinal: abdomen soft, NT, ND, BS+ Musculoskeletal: no deformities, strength intact in all 4 Skin: moist, warm, no rashes Neurological: no tremor with outstretched hands, DTR normal in all 4  ASSESSMENT: 1. DM2, non-insulin-dependent, uncontrolled, with complications - microalbuminuria  2. HL  3. MAU  PLAN:  1. Patient with longstanding, previously uncontrolled diabetes, but with better control after addition of Trulicity to her oral regimen.  At last visit, she improved her diet, however, she was occasionally missing morning metformin due to  diarrhea midday so I advised her to move the entire dose with dinner if she could tolerate it.  At this visit, she is still taking 1 tablet twice a day, but she mentions that she does not have significant GI problems after she reduced the dose from 3 tablets a day to only 2. -At this visit, HbA1c is 7.3% (higher), however, sugars are approximately the same as before, but she is not checking quite once a day.  It is unclear whether she is missing high blood sugars or if the sugars were higher and they improved recently. -Based on her sugars at home now, I will not change her regimen - I suggested to:  Patient Instructions  Please continue: - Metformin ER 500 mg 2x a day - Amaryl 4 mg in am and 2 mg before dinner - Trulicity 1.5 mg weekly   Please return in 3-4 months with  your sugar log.   - continue checking sugars at different times of the day - check 1x a day, rotating checks - advised for yearly eye exams >> she is UTD - Return to clinic in 3-4 mo with sugar log   2. HL - Reviewed latest lipid panel from 12/2017: After starting the statin, improved with the exception of LDL, which is still high, at 98 - Continues the statin without side effects.  3. MAU -She has a history of microalbuminuria. -Continue his ARB.  Switched from Cozaar to olmesartan by PCP. -We will check an ACR today  Office Visit on 09/30/2018  Component Date Value Ref Range Status  . Microalb, Ur 09/30/2018 3.1* 0.0 - 1.9 mg/dL Final  . Creatinine,U 09/30/2018 179.6  mg/dL Final  . Microalb Creat Ratio 09/30/2018 1.7  0.0 - 30.0 mg/g Final  . Hemoglobin A1C 09/30/2018 7.3* 4.0 - 5.6 % Final   ACR now normal.  Philemon Kingdom, MD PhD [facility]

## 2018-09-30 NOTE — Patient Instructions (Signed)
Please continue: - Metformin ER 500 mg 2x a day - Amaryl 4 mg in am and 2 mg before dinner - Trulicity 1.5 mg weekly   Please return in 3-4 months with your sugar log.

## 2018-10-04 ENCOUNTER — Telehealth: Payer: Self-pay

## 2018-10-04 NOTE — Telephone Encounter (Signed)
-----   Message from Philemon Kingdom, MD sent at 09/30/2018  5:20 PM EST ----- Lenna Sciara, can you please call pt: She does not have increased protein in her urine.

## 2018-10-05 NOTE — Telephone Encounter (Signed)
Notified patient of message from Dr. Gherghe, patient expressed understanding and agreement. No further questions.  

## 2018-10-06 ENCOUNTER — Ambulatory Visit: Payer: Medicare Other | Admitting: Internal Medicine

## 2019-02-02 ENCOUNTER — Other Ambulatory Visit: Payer: Self-pay

## 2019-02-03 ENCOUNTER — Other Ambulatory Visit: Payer: Self-pay

## 2019-02-03 ENCOUNTER — Ambulatory Visit: Payer: Medicare Other | Admitting: Internal Medicine

## 2019-02-03 ENCOUNTER — Encounter: Payer: Self-pay | Admitting: Internal Medicine

## 2019-02-03 VITALS — BP 120/60 | HR 86 | Ht 62.0 in | Wt 155.0 lb

## 2019-02-03 DIAGNOSIS — E785 Hyperlipidemia, unspecified: Secondary | ICD-10-CM | POA: Diagnosis not present

## 2019-02-03 DIAGNOSIS — E1129 Type 2 diabetes mellitus with other diabetic kidney complication: Secondary | ICD-10-CM

## 2019-02-03 DIAGNOSIS — R809 Proteinuria, unspecified: Secondary | ICD-10-CM

## 2019-02-03 LAB — POCT GLYCOSYLATED HEMOGLOBIN (HGB A1C): Hemoglobin A1C: 6.7 % — AB (ref 4.0–5.6)

## 2019-02-03 NOTE — Patient Instructions (Addendum)
Please continue: - Metformin ER 500 mg 2x a day - Amaryl 4 mg in am and 2 mg before dinner - Trulicity 1.5 mg weekly   Please return in 4 months with your sugar log.

## 2019-02-03 NOTE — Addendum Note (Signed)
Addended by: Cardell Peach I on: 02/03/2019 01:28 PM   Modules accepted: Orders

## 2019-02-03 NOTE — Progress Notes (Signed)
Patient ID: Ann [name], female   DOB: 09-Sep-1945, 73 y.o.   MRN: [id number]   HPI: Ann [name] is a 73 y.o.-year-old female, initially referred by Dr. Orland Mustard, now returning for f/u for DM2, dx in 2006, non-insulin-dependent, uncontrolled, with complications (microalbuminuria). Last visit 4 months ago. PCP: DR. [name].  She had gastroenteritis: diarrhea,erructations, AP. This finally started to improve lately.  Due to this, she had to cut out bananas, milk, cheese.  Sugars are better.  She also had insomnia >> now improving.  Last hemoglobin A1c was: Lab Results  Component Value Date   HGBA1C 7.3 (A) 09/30/2018   HGBA1C 6.5 (A) 06/06/2018   HGBA1C 7.9 (A) 03/15/2018  02/25/2016: 11.2% Previously 9.7% Previously 8.9%  Pt is on a regimen of: - Metformin ER 500 mg 2x a day - Amaryl 4 mg in am and 2 mg before dinner - Trulicity 1.5 mg weekly She was on Metformin 1000 mg 2x a day >> diarrhea >> stopped. She refused insulin.  Pt checks her sugars 0 to once a day: - am:  120-150s >> 117-144 >> 130s >> 108, 128-130s, 145 - 2h after b'fast: n/c - before lunch: n/c - 2h after lunch: n/c - before dinner: 102, 168 >> 130-150 >> n/c - 2h after dinner:  137-157, 160 >> 130s-150s >> 120s - bedtime:  140-156 >> 130s-160s, 201 >> n/c - nighttime: n/c >> 143-179 >> n/c Lowest sugar was 120 >> 108; she has hypoglycemia awareness in the 70s. Highest sugar was 201 >> 180 >> 150s >> 158.  Glucometer: Freestyle Lite  Pt's meals are: - Breakfast: cereal, boiled egg, juice - Lunch: sandwich, salad - Dinner: meat + salad, potato - Snacks: 3 She is on nutrition in the past.  -No CKD, last BUN/creatinine:  01/11/2018: CMP with a high glucose of 175.  BUN/creatinine normal 11/0.84, with the rest of the CMP being normal Lab Results  Component Value Date   BUN 18 09/06/2017   Lab Results  Component Value Date   CREATININE 1.00 09/06/2017  06/30/2016: 14/0.92 02/25/2016:  16/0.92, EGFR 60/73. At that time, glucose was 259, with a sodium of 133.    ACR normalized at last check: Lab Results  Component Value Date   MICRALBCREAT 1.7 09/30/2018  05/28/2015: Urine ACR 45.6 On olmesartan.  + HL;last set of lipids: 01/11/2018:  168/156/38/98 07/30/2017: 154/240/32/74 No results found for: CHOL, HDL, LDLCALC, LDLDIRECT, TRIG, CHOLHDL 06/30/2016: 163/186/33/92 05/28/2015: 170/173/34/102 She started on the statin in 01/2018.  - last eye exam was in spring 2019: No DR, + macular degeneration. Next appt next mo with Dr. Posey Pronto.  - no numbness and tingling in her feet.  ROS: Constitutional: no weight gain/no weight loss, no fatigue, no subjective hyperthermia, no subjective hypothermia Eyes: no blurry vision, no xerophthalmia ENT: no sore throat, no nodules palpated in neck, no dysphagia, no odynophagia, no hoarseness Cardiovascular: no CP/no SOB/no palpitations/no leg swelling Respiratory: no cough/no SOB/no wheezing Gastrointestinal: no N/no V/no D/no C/no acid reflux Musculoskeletal: no muscle aches/no joint aches Skin: no rashes, no hair loss Neurological: no tremors/no numbness/no tingling/no dizziness  I reviewed pt's medications, allergies, PMH, social hx, family hx, and changes were documented in the history of present illness. Otherwise, unchanged from my initial visit note.   Past Medical History:  Diagnosis Date  . Diabetes mellitus without complication ([place])   . Hypertension    Past Surgical History:  Procedure Laterality Date  . ABDOMINAL HYSTERECTOMY    .  knee surgery  04/2013   Social History   Social History  . Marital status: Married    Spouse name: N/A  . Number of children: 3   Occupational History  . Retired Tourist information centre manager   Social History Main Topics  . Smoking status: Never Smoker  . Smokeless tobacco: No  . Alcohol use No  . Drug use: no   Current Outpatient Medications on File Prior to Visit  Medication Sig Dispense Refill   . allopurinol (ZYLOPRIM) 100 MG tablet Take 200 mg by mouth at bedtime.     Marland Kitchen amLODipine (NORVASC) 10 MG tablet Take 10 mg by mouth daily. Unknown dose    . atenolol (TENORMIN) 50 MG tablet Take 50 mg by mouth daily. Unknown dose     . glimepiride (AMARYL) 4 MG tablet TAKE 1 TABLET BEFORE BREAKFAST AND 1/2 TABLET BEFORE DINNER 135 tablet 1  . glucose blood (FREESTYLE TEST STRIPS) test strip Use 2x a day 100 each 11  . metFORMIN (GLUCOPHAGE-XR) 500 MG 24 hr tablet TAKE 1 TABLET (500 MG TOTAL) BY MOUTH 2 (TWO) TIMES DAILY WITH A MEAL. 180 tablet 2  . montelukast (SINGULAIR) 10 MG tablet Take 10 mg by mouth at bedtime. Unknown dose    . olmesartan (BENICAR) 20 MG tablet Take 20 mg by mouth daily.  0  . TRULICITY 1.5 VC/9.4WH SOPN INJECT INTO THE SKIN WEEKLY. 6 pen 3  . zolpidem (AMBIEN) 10 MG tablet Take 10 mg by mouth at bedtime as needed for sleep.     No current facility-administered medications on file prior to visit.    Allergies  Allergen Reactions  . Sulfa Antibiotics      FH: - Diabetes in mother, sister, brother - Hypertension in mother and sister - Hyperlipidemia in brother  PE: BP 120/60   Pulse 86   Ht _0  (1.575 m)   Wt 155 lb (70.3 kg)   SpO2 98%   BMI 28.35 kg/m  Wt Readings from Last 3 Encounters:  02/03/19 155 lb (70.3 kg)  09/30/18 161 lb (73 kg)  06/06/18 159 lb (72.1 kg)   Constitutional: Normal weight, in NAD Eyes: PERRLA, EOMI, no exophthalmos ENT: moist mucous membranes, no thyromegaly, no cervical lymphadenopathy Cardiovascular: RRR, No MRG Respiratory: CTA B Gastrointestinal: abdomen soft, NT, ND, BS+ Musculoskeletal: no deformities, strength intact in all 4 Skin: moist, warm, no rashes Neurological: no tremor with outstretched hands, DTR normal in all 4  ASSESSMENT: 1. DM2, non-insulin-dependent, uncontrolled, with complications - microalbuminuria  2. HL  3. MAU  PLAN:  1. Patient with longstanding, previously uncontrolled diabetes,  with better control after addition of Trulicity to her oral regimen.  However, at last visit, she relaxed her diet during the holidays and her HbA1c was higher, at 7.3%.  She was not checking sugars later in the day, but only in the morning and I advised her to try to rotate the check times.  We did not change her regimen at that time -Better after she cut down portions and also after she had her gastroenteritis.  She still has some gastric discomfort, but this is improving she does not feel that metformin or Amaryl are the culprits for this. -For now, reviewing her log, her sugars are almost all at goal.  We will not change her regimen.  I encouraged her to continue to stay off milk and cheese. - I suggested to:  Patient Instructions  Please continue: - Metformin ER 500 mg 2x a day -  Amaryl 4 mg in am and 2 mg before dinner - Trulicity 1.5 mg weekly   Please return in 4 months with your sugar log.   - we checked her HbA1c: 6.7% (better) - advised to check sugars at different times of the day - 1x a day, rotating check times - advised for yearly eye exams >> she is due - return to clinic in 4 months   2. HL - Reviewed latest lipid panel from 12/2017: Improved after starting a statin.  LDL under 100 now - Continues the statin without side effects.  3. MAU -She has a history of microalbuminuria, but this normalized at last visit -Before last visit she was switched from Cozaar to olmesartan by PCP.  Continue the olmesartan now.   Had labs by PCP in 11/2018 >> will need records.  Philemon Kingdom, MD PhD [facility]

## 2019-02-20 ENCOUNTER — Other Ambulatory Visit: Payer: Self-pay | Admitting: Internal Medicine

## 2019-03-11 ENCOUNTER — Other Ambulatory Visit: Payer: Self-pay | Admitting: Internal Medicine

## 2019-03-20 ENCOUNTER — Other Ambulatory Visit: Payer: Self-pay

## 2019-03-20 DIAGNOSIS — Z20822 Contact with and (suspected) exposure to covid-19: Secondary | ICD-10-CM

## 2019-03-21 LAB — NOVEL CORONAVIRUS, NAA: SARS-CoV-2, NAA: NOT DETECTED

## 2019-06-14 ENCOUNTER — Other Ambulatory Visit: Payer: Self-pay

## 2019-06-16 ENCOUNTER — Ambulatory Visit: Payer: Medicare Other | Admitting: Internal Medicine

## 2019-06-16 ENCOUNTER — Other Ambulatory Visit: Payer: Self-pay

## 2019-06-16 ENCOUNTER — Encounter: Payer: Self-pay | Admitting: Internal Medicine

## 2019-06-16 VITALS — BP 120/60 | HR 80 | Ht 62.0 in | Wt 157.0 lb

## 2019-06-16 DIAGNOSIS — R809 Proteinuria, unspecified: Secondary | ICD-10-CM | POA: Diagnosis not present

## 2019-06-16 DIAGNOSIS — E1129 Type 2 diabetes mellitus with other diabetic kidney complication: Secondary | ICD-10-CM | POA: Diagnosis not present

## 2019-06-16 DIAGNOSIS — E785 Hyperlipidemia, unspecified: Secondary | ICD-10-CM | POA: Diagnosis not present

## 2019-06-16 LAB — POCT GLYCOSYLATED HEMOGLOBIN (HGB A1C): Hemoglobin A1C: 7.1 % — AB (ref 4.0–5.6)

## 2019-06-16 MED ORDER — FREESTYLE LITE DEVI: 0 refills | Status: DC

## 2019-06-16 NOTE — Progress Notes (Addendum)
Patient ID: Ann [name], female   DOB: [birth year], 73 y.o.   MRN: [id number]   This visit occurred during the SARS-CoV-2 public health emergency.  Safety protocols were in place, including screening questions prior to the visit, additional usage of staff PPE, and extensive cleaning of exam room while observing appropriate contact time as indicated for disinfecting solutions.   HPI: Ann [name] is a 73 y.o.-year-old female, initially referred by Dr. Orland Mustard, now returning for f/u for DM2, dx in 2006, non-insulin-dependent, uncontrolled, with complications (microalbuminuria). Last visit 4 months ago. PCP: DR. [name].  Before last visit, she had gastroenteritis: diarrhea, erructations, AP.  Sugars were better at that time as she  reduced fatty foods.  Since then, her symptoms resolved and she resumes her previous diet.  Sugars are slightly higher.  Reviewed HbA1c levels: Lab Results  Component Value Date   HGBA1C 6.7 (A) 02/03/2019   HGBA1C 7.3 (A) 09/30/2018   HGBA1C 6.5 (A) 06/06/2018  02/25/2016: 11.2% Previously 9.7% Previously 8.9%  Pt is on a regimen of: - Metformin ER 500 mg 2x a day - Amaryl 4 mg in am and 2 mg before dinner - Trulicity 1.5 mg weekly She was on Metformin 1000 mg 2x a day >> diarrhea >> stopped. She refused insulin.  Pt checks her sugars 0 to once a day - not in last few weeks as her meter broke: - am:   117-144 >> 130s >> 108, 128-130s, 145 >> 128-150 - 2h after b'fast: n/c - before lunch: n/c - 2h after lunch: n/c - before dinner: 102, 168 >> 130-150 >> n/c - 2h after dinner:  137-157, 160 >> 130s-150s >> 120s >> n/c - bedtime:  140-156 >> 130s-160s, 201 >> n/c - nighttime: n/c >> 143-179 >> n/c Lowest sugar was 108 >> 120; she has hypoglycemia awareness in the 70s. Highest sugar was 158 >> 150s.  Glucometer: Freestyle Lite  Pt's meals are: - Breakfast: cereal, boiled egg, juice - Lunch: sandwich, salad - Dinner: meat + salad,  potato - Snacks: 3 She is on nutrition in the past.  No CKD, last BUN/creatinine:  01/11/2018: CMP with a high glucose of 175.  BUN/creatinine normal 11/0.84, with the rest of the CMP being normal Lab Results  Component Value Date   BUN 18 09/06/2017   Lab Results  Component Value Date   CREATININE 1.00 09/06/2017  06/30/2016: 14/0.92 02/25/2016: 16/0.92, EGFR 60/73. At that time, glucose was 259, with a sodium of 133.    ACR normalized at last check Lab Results  Component Value Date   MICRALBCREAT 1.7 09/30/2018  05/28/2015: Urine ACR 45.6 On olmesartan.  + HL;last set of lipids: 01/11/2018:  168/156/38/98 07/30/2017: 154/240/32/74 No results found for: CHOL, HDL, LDLCALC, LDLDIRECT, TRIG, CHOLHDL 06/30/2016: 163/186/33/92 05/28/2015: 170/173/34/102 She started on the statin in 01/2018.  - last eye exam was in 02/2019: No DR, + macular degeneration.  Dr. Posey Pronto.  -She denies numbness and tingling in her feet.  ROS: Constitutional: no weight gain/no weight loss, no fatigue, no subjective hyperthermia, no subjective hypothermia Eyes: no blurry vision, no xerophthalmia ENT: no sore throat, no nodules palpated in neck, no dysphagia, no odynophagia, no hoarseness Cardiovascular: no CP/no SOB/no palpitations/no leg swelling Respiratory: no cough/no SOB/no wheezing Gastrointestinal: no N/no V/no D/no C/no acid reflux Musculoskeletal: no muscle aches/no joint aches Skin: no rashes, no hair loss Neurological: no tremors/no numbness/no tingling/no dizziness  I reviewed pt's medications, allergies, PMH, social hx, family hx, and changes  were documented in the history of present illness. Otherwise, unchanged from my initial visit note.   Past Medical History:  Diagnosis Date  . Diabetes mellitus without complication ([place])   . Hypertension    Past Surgical History:  Procedure Laterality Date  . ABDOMINAL HYSTERECTOMY    . knee surgery  04/2013   Social History   Social  History  . Marital status: Married    Spouse name: N/A  . Number of children: 3   Occupational History  . Retired Tourist information centre manager   Social History Main Topics  . Smoking status: Never Smoker  . Smokeless tobacco: No  . Alcohol use No  . Drug use: no   Current Outpatient Medications on File Prior to Visit  Medication Sig Dispense Refill  . allopurinol (ZYLOPRIM) 100 MG tablet Take 200 mg by mouth at bedtime.     Marland Kitchen amLODipine (NORVASC) 10 MG tablet Take 10 mg by mouth daily. Unknown dose    . atenolol (TENORMIN) 50 MG tablet Take 50 mg by mouth daily. Unknown dose     . glimepiride (AMARYL) 4 MG tablet TAKE 1 TABLET BEFORE BREAKFAST AND 1/2 TABLET BEFORE DINNER 135 tablet 1  . glucose blood (FREESTYLE TEST STRIPS) test strip Use 2x a day 100 each 11  . metFORMIN (GLUCOPHAGE-XR) 500 MG 24 hr tablet TAKE 1 TABLET (500 MG TOTAL) BY MOUTH 2 (TWO) TIMES DAILY WITH A MEAL. 180 tablet 2  . montelukast (SINGULAIR) 10 MG tablet Take 10 mg by mouth at bedtime. Unknown dose    . olmesartan (BENICAR) 20 MG tablet Take 20 mg by mouth daily.  0  . omeprazole (PRILOSEC) 20 MG capsule Take by mouth daily.    . TRULICITY 1.5 TF/5.7DU SOPN INJECT INTO THE SKIN WEEKLY. 6 pen 3  . zolpidem (AMBIEN) 10 MG tablet Take 10 mg by mouth at bedtime as needed for sleep.     No current facility-administered medications on file prior to visit.    Allergies  Allergen Reactions  . Sulfa Antibiotics     FH: - Diabetes in mother, sister, brother - Hypertension in mother and sister - Hyperlipidemia in brother  PE: BP 120/60   Pulse 80   Ht 5' 2"  (1.575 m)   Wt 157 lb (71.2 kg)   SpO2 97%   BMI 28.72 kg/m  Wt Readings from Last 3 Encounters:  06/16/19 157 lb (71.2 kg)  02/03/19 155 lb (70.3 kg)  09/30/18 161 lb (73 kg)   Constitutional: normal weight, in NAD Eyes: PERRLA, EOMI, no exophthalmos ENT: moist mucous membranes, no thyromegaly, no cervical lymphadenopathy Cardiovascular: RRR, No MRG Respiratory:  CTA B Gastrointestinal: abdomen soft, NT, ND, BS+ Musculoskeletal: no deformities, strength intact in all 4 Skin: moist, warm, no rashes Neurological: no tremor with outstretched hands, DTR normal in all 4  ASSESSMENT: 1. DM2, non-insulin-dependent, uncontrolled, with complications - microalbuminuria  2. HL  3. MAU  PLAN:  1. Patient with longstanding, previously uncontrolled diabetes, with better control after addition of Trulicity to her oral regimen.  At last visit, her sugars were almost all at goal and we did not change her regimen.  We discussed about diet and I encouraged her to stay off milk and cheese and other fatty foods. -At this visit, sugars are higher in the morning if she is not checking later in the day.  We called in a prescription for another glucometer since her stroke and I advised him to start checking at different times of  the day.  Also, since she is forgetting Trulicity doses, we discussed about setting alarms on her phone to take it.  She agrees to do so. -Since sugars are higher, I suggested possibly increasing the Trulicity to 3 mg weekly but she would like to work on her diet and take the medication consistently for the next 3 months and then increase the dose if needed. - I suggested to:  Patient Instructions  Please continue: - Metformin ER 589m 2x a day - Amaryl 4 mg in am and 2 mg before dinner - Trulicity 1.5 mg weekly   Please return in 4 months with your sugar log.   - we checked her HbA1c: 7.1% (slightly higher) - advised to check sugars at different times of the day - 1x a day, rotating check times - advised for yearly eye exams >> she is UTD - UTD with flu shot  - return to clinic in 3 months  2. HL -Reviewed latest lipid panel from 12/2017: Improved after starting a statin.  LDL now under 100. -Continues the statin without side effects.  We will also need to find out which statin she is taking.  3. MAU -She has a history of  microalbuminuria, but this normalized at last check -Previously on Cozaar, now on olmesartan, per PCP. -We will need to obtain her latest records from PCP.  She had these checked recently.  She will call PCPs office to have them [place].  CPhilemon Kingdom MD PhD [facility]

## 2019-06-16 NOTE — Addendum Note (Signed)
Addended by: Cardell Peach I on: 06/16/2019 01:43 PM   Modules accepted: Orders

## 2019-06-16 NOTE — Patient Instructions (Addendum)
Please continue: - Metformin ER 500 mg 2x a day - Amaryl 4 mg in am and 2 mg before dinner - Trulicity 1.5 mg weekly  Please check sugars at different times of the day.  Set alarms on your phone to take Trulicity.    Please return in 3 months with your sugar log.

## 2019-06-19 ENCOUNTER — Encounter: Payer: Self-pay | Admitting: Internal Medicine

## 2019-06-19 NOTE — Progress Notes (Signed)
Received labs from PCP drawn on 06/05/2019: BUN/creatinine 11/0.91, glucose 128 Calcium normal at 9.6

## 2019-07-17 ENCOUNTER — Ambulatory Visit
Admission: EM | Admit: 2019-07-17 | Discharge: 2019-07-17 | Disposition: A | Discharge status: Home / Self Care | Payer: Medicare Other

## 2019-07-17 DIAGNOSIS — L299 Pruritus, unspecified: Secondary | ICD-10-CM | POA: Diagnosis not present

## 2019-07-17 NOTE — ED Provider Notes (Signed)
[facility]    CSN: [id number] Arrival date & time: 07/17/19  1511      History   Chief Complaint Chief Complaint  Patient presents with  . Otalgia    HPI Ann [name] is a 73 y.o. female.   73 year old female comes in for 1 week history of bilateral ear itching. She also describes left ear with "like something is blowing".  Denies ear pain, hearing changes.  Denies ear drainage. Has rhinorrhea/nasal congestion at baseline that has not worsened. denies cough, sore throat. Denies fever, chills, body aches.  Last A1c 7.1, has not tried anything for the symptoms.      Past Medical History:  Diagnosis Date  . Diabetes mellitus without complication ([place])   . Hypertension     Patient Active Problem List   Diagnosis Date Noted  . Microalbuminuria 09/30/2018  . Hyperlipidemia 10/07/2017  . Type 2 diabetes mellitus with microalbuminuria, without long-term current use of insulin ([place]) 04/28/2016  . Essential hypertension 04/28/2016    Past Surgical History:  Procedure Laterality Date  . ABDOMINAL HYSTERECTOMY    . knee surgery  04/2013    OB History   No obstetric history on file.     Home Medications    Prior to Admission medications   Medication Sig Start Date End Date Taking? Authorizing Provider  allopurinol (ZYLOPRIM) 100 MG tablet Take 200 mg by mouth at bedtime.  09/01/17   [provider]  amLODipine (NORVASC) 10 MG tablet Take 10 mg by mouth daily. Unknown dose    [provider]  atenolol (TENORMIN) 50 MG tablet Take 50 mg by mouth daily. Unknown dose     [provider]  Blood Glucose Monitoring Suppl (FREESTYLE LITE) DEVI Use to check blood sugar 2 times a day 06/16/19   Philemon Kingdom, MD  glimepiride (AMARYL) 4 MG tablet TAKE 1 TABLET BEFORE BREAKFAST AND 1/2 TABLET BEFORE DINNER 02/20/19   Philemon Kingdom, MD  glucose blood (FREESTYLE TEST STRIPS) test strip Use 2x a day 02/24/17   Philemon Kingdom, MD    metFORMIN (GLUCOPHAGE-XR) 500 MG 24 hr tablet TAKE 1 TABLET (500 MG TOTAL) BY MOUTH 2 (TWO) TIMES DAILY WITH A MEAL. 03/13/19   Philemon Kingdom, MD  montelukast (SINGULAIR) 10 MG tablet Take 10 mg by mouth at bedtime. Unknown dose    [provider]  olmesartan (BENICAR) 20 MG tablet Take 20 mg by mouth daily. 05/24/18   [provider]  omeprazole (PRILOSEC) 20 MG capsule Take by mouth daily. 01/16/19   [provider]  TRULICITY 1.5 0000000 SOPN INJECT INTO THE SKIN WEEKLY. 09/26/18   Philemon Kingdom, MD  zolpidem (AMBIEN) 10 MG tablet Take 10 mg by mouth at bedtime as needed for sleep.    [provider]    Family History History reviewed. No pertinent family history.  Social History Social History   Tobacco Use  . Smoking status: Never Smoker  . Smokeless tobacco: Never Used  Substance Use Topics  . Alcohol use: No  . Drug use: No     Allergies   Sulfa antibiotics   Review of Systems Review of Systems  Reason unable to perform ROS: See HPI as above.     Physical Exam Triage Vital Signs ED Triage Vitals  Enc Vitals Group     BP 07/17/19 1546 (!) 150/82     Pulse Rate 07/17/19 1546 73     Resp 07/17/19 1546 18  Temp 07/17/19 1546 98.3 F (36.8 C)     Temp Source 07/17/19 1546 Oral     SpO2 07/17/19 1546 95 %     Weight --      Height --      Head Circumference --      Peak Flow --      Pain Score 07/17/19 1616 0     Pain Loc --      Pain Edu? --      Excl. in [place]? --    No data found.  Updated Vital Signs BP (!) 150/82 (BP Location: Left Arm)   Pulse 73   Temp 98.3 F (36.8 C) (Oral)   Resp 18   SpO2 95%   Physical Exam Constitutional:      General: She is not in acute distress.    Appearance: She is well-developed. She is not diaphoretic.  HENT:     Head: Normocephalic and atraumatic.     Right Ear: Ear canal and external ear normal. A middle ear effusion is present. Tympanic membrane is not erythematous  or bulging.     Left Ear: Ear canal and external ear normal. A middle ear effusion is present. Tympanic membrane is not erythematous or bulging.  Eyes:     Conjunctiva/sclera: Conjunctivae normal.     Pupils: Pupils are equal, round, and reactive to light.  Pulmonary:     Effort: Pulmonary effort is normal. No respiratory distress.  Skin:    General: Skin is warm and dry.  Neurological:     Mental Status: She is alert and oriented to person, place, and time.      UC Treatments / Results  Labs (all labs ordered are listed, but only abnormal results are displayed) Labs Reviewed - No data to display  EKG   Radiology No results found.  Procedures Procedures (including critical care time)  Medications Ordered in UC Medications - No data to display  Initial Impression / Assessment and Plan / UC Course  I have reviewed the triage vital signs and the nursing notes.  Pertinent labs & imaging results that were available during my care of the patient were reviewed by me and considered in my medical decision making (see chart for details).    Will have patient start Flonase to cover for eustachian tube dysfunction on exam.  Unsure if this is causing the ear itching.  Can use hydrogen peroxide for ear canal itching if continues.  Return precautions given.  Final Clinical Impressions(s) / UC Diagnoses   Final diagnoses:  Ear itching    ED Prescriptions    None     PDMP not reviewed this encounter.   Ok Edwards, PA-C 07/17/19 1642

## 2019-07-17 NOTE — ED Triage Notes (Signed)
Pt c/o bilateral ear itching and "feels like a blowing" in the lt ear x 1wk. Denies pain

## 2019-07-17 NOTE — Discharge Instructions (Signed)
Start flonase as directed. Hydrogen peroxide as needed for itching. Follow up with PCP if symptom not improving.

## 2019-08-21 ENCOUNTER — Other Ambulatory Visit: Payer: Self-pay | Admitting: Internal Medicine

## 2019-09-14 ENCOUNTER — Ambulatory Visit: Payer: Medicare Other

## 2019-09-18 ENCOUNTER — Ambulatory Visit: Payer: Medicare PPO | Attending: Family

## 2019-09-18 DIAGNOSIS — Z23 Encounter for immunization: Secondary | ICD-10-CM

## 2019-09-18 NOTE — Progress Notes (Signed)
   Covid-19 Vaccination Clinic  Name:  Ann [name]    MRN: [id number] DOB: 08-07-1945  09/18/2019  [name] was observed post Covid-19 immunization for 15 minutes without incidence. She was provided with Vaccine Information Sheet and instruction to access the V-Safe system.   [name] was instructed to call 911 with any severe reactions post vaccine: Marland Kitchen Difficulty breathing  . Swelling of your face and throat  . A fast heartbeat  . A bad rash all over your body  . Dizziness and weakness    Immunizations Administered    Name Date Dose VIS Date Route   Moderna COVID-19 Vaccine 09/18/2019 11:56 AM 0.5 mL 06/27/2019 Intramuscular   Manufacturer: Moderna   Lot: [id number]   [id number]

## 2019-10-03 ENCOUNTER — Other Ambulatory Visit: Payer: Self-pay

## 2019-10-05 ENCOUNTER — Other Ambulatory Visit: Payer: Self-pay

## 2019-10-05 ENCOUNTER — Ambulatory Visit: Payer: Medicare Other | Admitting: Internal Medicine

## 2019-10-05 ENCOUNTER — Ambulatory Visit: Payer: Medicare PPO | Admitting: Internal Medicine

## 2019-10-05 ENCOUNTER — Encounter: Payer: Self-pay | Admitting: Internal Medicine

## 2019-10-05 VITALS — BP 132/60 | HR 72 | Ht 62.0 in | Wt 159.0 lb

## 2019-10-05 DIAGNOSIS — R809 Proteinuria, unspecified: Secondary | ICD-10-CM | POA: Diagnosis not present

## 2019-10-05 DIAGNOSIS — E1129 Type 2 diabetes mellitus with other diabetic kidney complication: Secondary | ICD-10-CM | POA: Diagnosis not present

## 2019-10-05 DIAGNOSIS — E785 Hyperlipidemia, unspecified: Secondary | ICD-10-CM | POA: Diagnosis not present

## 2019-10-05 LAB — POCT GLYCOSYLATED HEMOGLOBIN (HGB A1C): Hemoglobin A1C: 7.1 % — AB (ref 4.0–5.6)

## 2019-10-05 NOTE — Addendum Note (Signed)
Addended by: Cardell Peach I on: 10/05/2019 09:45 AM   Modules accepted: Orders

## 2019-10-05 NOTE — Patient Instructions (Signed)
  Please continue: - Metformin ER 500 mg 2x a day - Amaryl 4 mg in am and 2 mg before dinner - Trulicity 1.5 mg weekly              Please return in 4 months with your sugar log.

## 2019-10-05 NOTE — Progress Notes (Signed)
Patient ID: Ann [name], female   DOB: 08-21-1945, 74 y.o.   MRN: [id number]   This visit occurred during the SARS-CoV-2 public health emergency.  Safety protocols were in place, including screening questions prior to the visit, additional usage of staff PPE, and extensive cleaning of exam room while observing appropriate contact time as indicated for disinfecting solutions.   HPI: Ann [name] is a 74 y.o.-year-old female, initially referred by Dr. Orland Mustard, now returning for f/u for DM2, dx in 2006, non-insulin-dependent, uncontrolled, with complications (microalbuminuria). Last visit 4 months ago. PCP: DR. [name].  Reviewed HbA1c levels: Lab Results  Component Value Date   HGBA1C 7.1 (A) 06/16/2019   HGBA1C 6.7 (A) 02/03/2019   HGBA1C 7.3 (A) 09/30/2018  02/25/2016: 11.2% Previously 9.7% Previously 8.9%  Pt is on a regimen of: - Metformin ER 500 mg 2x a day - Amaryl 4 mg in am and 2 mg before dinner - Trulicity 1.5 mg weekly She was on Metformin 1000 mg 2x a day >> diarrhea >> stopped. She refused insulin.  Pt checks her sugars 0-1x a day: - am:   117-144 >> 130s >> 108, 128-130s, 145 >> 128-150 >> 119-146 - 2h after b'fast: n/c - before lunch: n/c - 2h after lunch: n/c - before dinner: 102, 168 >> 130-150 >> n/c - 2h after dinner:  137-157, 160 >> 130s-150s >> 120s >> n/c >> 122  - bedtime:  140-156 >> 130s-160s, 201 >> n/c - nighttime: n/c >> 143-179 >> n/c Lowest sugar was 108 >> 120 >> 119; she has hypoglycemia awareness in the 70s Highest sugar was 158 >> 150s >> 146.  Glucometer: Freestyle Lite  Pt's meals are: - Breakfast: cereal, boiled egg, juice - Lunch: sandwich, salad - Dinner: meat + salad, potato - Snacks: 3 She is on nutrition in the past.  No CKD, last BUN/creatinine:  06/05/2019: BUN/creatinine 11/0.91, glucose 128, Calcium normal at 9.6 01/11/2018: CMP with a high glucose of 175.  BUN/creatinine normal 11/0.84, with the rest of the CMP  being normal Lab Results  Component Value Date   BUN 18 09/06/2017   Lab Results  Component Value Date   CREATININE 1.00 09/06/2017  06/30/2016: 14/0.92 02/25/2016: 16/0.92, EGFR 60/73. At that time, glucose was 259, with a sodium of 133.    ACR normalized at last check: Lab Results  Component Value Date   MICRALBCREAT 1.7 09/30/2018  05/28/2015: Urine ACR 45.6 On olmesartan.  + HL;last set of lipids: 01/11/2018:  168/156/38/98 07/30/2017: 154/240/32/74 No results found for: CHOL, HDL, LDLCALC, LDLDIRECT, TRIG, CHOLHDL 06/30/2016: 163/186/33/92 05/28/2015: 170/173/34/102 She started on a statin in 01/2018.  - last eye exam was on 10/02/2019: No DR, + macular degeneration >> needs Laser Sx..  Dr. Posey Pronto.  -No numbness and tingling in her feet.  ROS: Constitutional: no weight gain/no weight loss, no fatigue, no subjective hyperthermia, no subjective hypothermia Eyes: no blurry vision, no xerophthalmia ENT: no sore throat, no nodules palpated in neck, no dysphagia, no odynophagia, no hoarseness Cardiovascular: no CP/no SOB/no palpitations/no leg swelling Respiratory: no cough/no SOB/no wheezing Gastrointestinal: no N/no V/no D/no C/no acid reflux Musculoskeletal: no muscle aches/no joint aches Skin: no rashes, no hair loss Neurological: no tremors/no numbness/no tingling/no dizziness  I reviewed pt's medications, allergies, PMH, social hx, family hx, and changes were documented in the history of present illness. Otherwise, unchanged from my initial visit note.  Past Medical History:  Diagnosis Date  . Diabetes mellitus without complication ([place])   .  Hypertension    Past Surgical History:  Procedure Laterality Date  . ABDOMINAL HYSTERECTOMY    . knee surgery  04/2013   Social History   Social History  . Marital status: Married    Spouse name: N/A  . Number of children: 3   Occupational History  . Retired Tourist information centre manager   Social History Main Topics  . Smoking status:  Never Smoker  . Smokeless tobacco: No  . Alcohol use No  . Drug use: no   Current Outpatient Medications on File Prior to Visit  Medication Sig Dispense Refill  . allopurinol (ZYLOPRIM) 100 MG tablet Take 200 mg by mouth at bedtime.     Marland Kitchen amLODipine (NORVASC) 10 MG tablet Take 10 mg by mouth daily. Unknown dose    . atenolol (TENORMIN) 50 MG tablet Take 50 mg by mouth daily. Unknown dose     . Blood Glucose Monitoring Suppl (FREESTYLE LITE) DEVI Use to check blood sugar 2 times a day 1 each 0  . glimepiride (AMARYL) 4 MG tablet TAKE 1 TABLET BEFORE BREAKFAST AND 1/2 TABLET BEFORE DINNER 135 tablet 1  . glucose blood (FREESTYLE TEST STRIPS) test strip Use 2x a day 100 each 11  . metFORMIN (GLUCOPHAGE-XR) 500 MG 24 hr tablet TAKE 1 TABLET (500 MG TOTAL) BY MOUTH 2 (TWO) TIMES DAILY WITH A MEAL. 180 tablet 2  . montelukast (SINGULAIR) 10 MG tablet Take 10 mg by mouth at bedtime. Unknown dose    . olmesartan (BENICAR) 20 MG tablet Take 20 mg by mouth daily.  0  . omeprazole (PRILOSEC) 20 MG capsule Take by mouth daily.    . TRULICITY 1.5 KP/5.3ZS SOPN INJECT INTO THE SKIN WEEKLY. 6 pen 3  . zolpidem (AMBIEN) 10 MG tablet Take 10 mg by mouth at bedtime as needed for sleep.     No current facility-administered medications on file prior to visit.   Allergies  Allergen Reactions  . Sulfa Antibiotics     FH: - Diabetes in mother, sister, brother - Hypertension in mother and sister - Hyperlipidemia in brother  PE: BP 132/60   Pulse 72   Ht 5' 2"  (1.575 m)   Wt 159 lb (72.1 kg)   SpO2 98%   BMI 29.08 kg/m  Wt Readings from Last 3 Encounters:  10/05/19 159 lb (72.1 kg)  06/16/19 157 lb (71.2 kg)  02/03/19 155 lb (70.3 kg)   Constitutional: normal weight, in NAD Eyes: PERRLA, EOMI, no exophthalmos ENT: moist mucous membranes, no thyromegaly, no cervical lymphadenopathy Cardiovascular: RRR, No MRG Respiratory: CTA B Gastrointestinal: abdomen soft, NT, ND, BS+ Musculoskeletal: no  deformities, strength intact in all 4 Skin: moist, warm, no rashes Neurological: no tremor with outstretched hands, DTR normal in all 4  ASSESSMENT: 1. DM2, non-insulin-dependent, uncontrolled, with complications - microalbuminuria  2. HL  3. MAU  PLAN:  1. Patient with longstanding, previously uncontrolled diabetes, with better control after addition of Trulicity to her oral regimen.  At this visit she continues on Metformin ER, sulfonylurea in 1.5 mg Trulicity weekly.  At last visit, sugars were higher in the morning and she was not checking later in the day.  I called in a prescription for another glucometer and advised him to check at different times of the day.  She was also forgetting Trulicity doses and I advised her to set alarms on her phone to take it.  I also suggested maybe increase the dose of Trulicity to 3 mg weekly but she wanted  to work on her diet and take her medication consistently before an increase in dose. -At this visit, sugars are approximately the same as before.  She is not checking frequently and I explained the reason why she should continue to check and increase the frequency.  Whenever she is checking, sugars are close to goal, especially after dinner, but in the morning they can be up to the 140s.   - we checked her HbA1c: 7.1% (stable) -Due to age, our target is slightly higher -I would not suggest to change the regimen for now - I suggested to:  Patient Instructions  Please continue: - Metformin ER 500 mg 2x a day - Amaryl 4 mg in am and 2 mg before dinner - Trulicity 1.5 mg weekly   Please return in 4 months with your sugar log.   - advised to check sugars at different times of the day - 1x a day, rotating check times - advised for yearly eye exams >> she is UTD - return to clinic in 4 months  2. HL -Regulated lipid panel from 12/2017: Improved after starting the statin.  LDL under 100 -Continues the statin without side effects - needs the records  from PCP  3. MAU -She has a history of microalbuminuria, but this normalized at last check -Continues on olmesartan  Philemon Kingdom, MD PhD [facility] Endocrinology

## 2019-10-17 ENCOUNTER — Ambulatory Visit: Payer: Medicare PPO | Attending: Family

## 2019-10-17 DIAGNOSIS — Z23 Encounter for immunization: Secondary | ICD-10-CM

## 2019-10-17 NOTE — Progress Notes (Signed)
   Covid-19 Vaccination Clinic  Name:  Ann [name]    MRN: [id number] DOB: Sep 17, 1945  10/17/2019  [name] was observed post Covid-19 immunization for 15 minutes without incident. She was provided with Vaccine Information Sheet and instruction to access the V-Safe system.   [name] was instructed to call 911 with any severe reactions post vaccine: Marland Kitchen Difficulty breathing  . Swelling of face and throat  . A fast heartbeat  . A bad rash all over body  . Dizziness and weakness   Immunizations Administered    Name Date Dose VIS Date Route   Moderna COVID-19 Vaccine 10/17/2019  2:57 PM 0.5 mL 06/27/2019 Intramuscular   Manufacturer: Moderna   LotHQ:[id number]   [place]:[id number]

## 2019-11-08 ENCOUNTER — Other Ambulatory Visit: Payer: Self-pay | Admitting: Internal Medicine

## 2019-11-20 IMAGING — CT CT HEAD W/O CM
3 series · 15 of 47 positions shown, 18 images · non-contrast
Comparison: None.

CLINICAL DATA: Acute onset of numbness and tingling in the hands.

EXAM:
CT HEAD WITHOUT CONTRAST
TECHNIQUE: Contiguous axial images were obtained from the base of the skull
through the vertex without intravenous contrast.

[Series 2: head wo · axial · 0.47mm/px · z∈[-231,-106]mm · 9 of 31 slices shown, 12 images]
[im 3/31  brain]
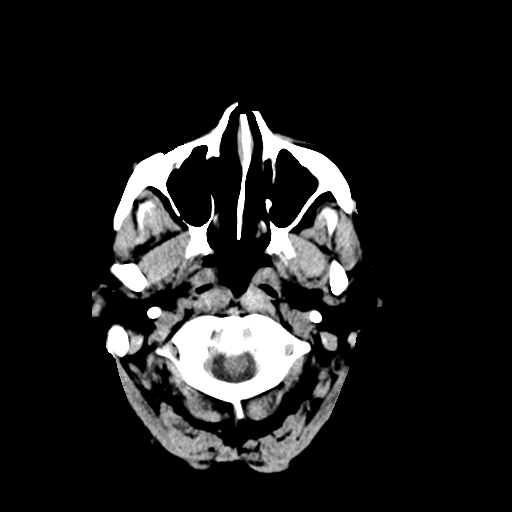
[im 3/31  bone]
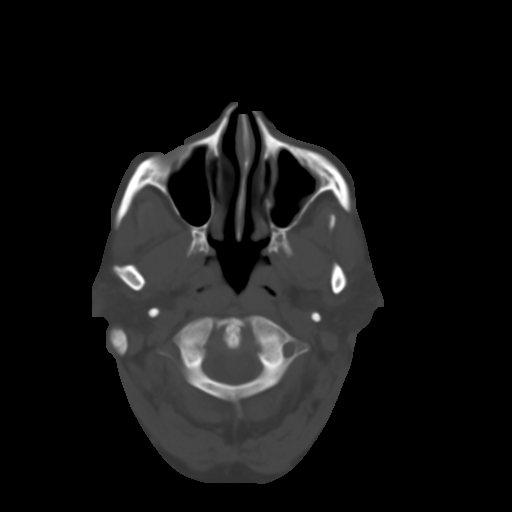
[im 6/31  brain]
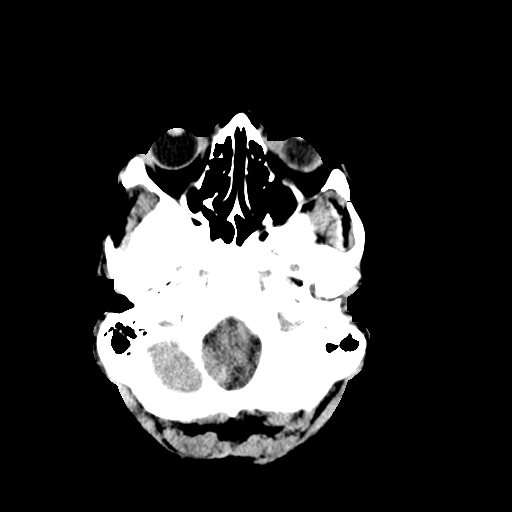
[im 9/31  brain]
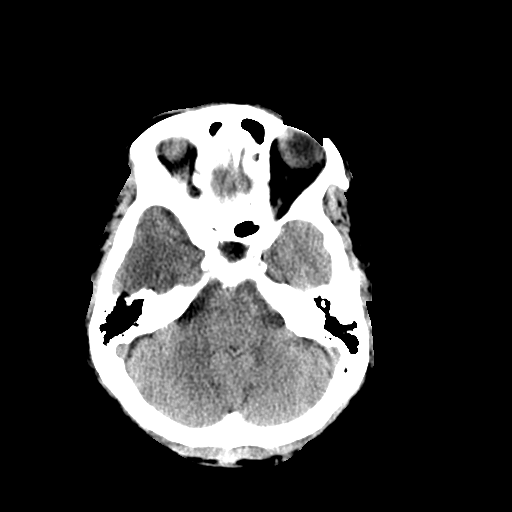
[im 12/31  brain]
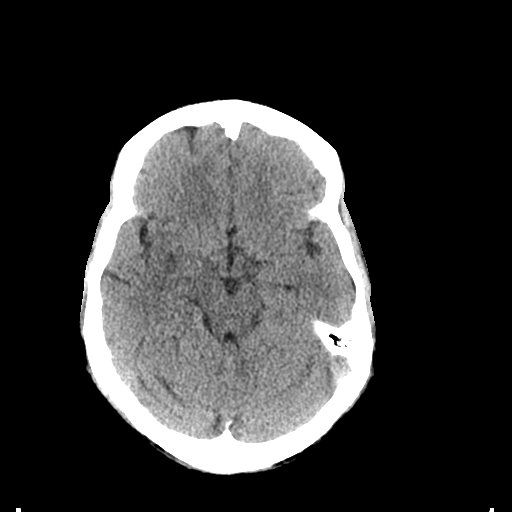
[im 16/31  brain]
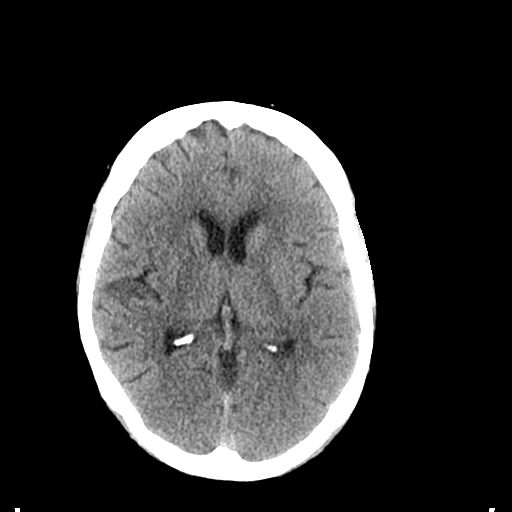
[im 16/31  bone]
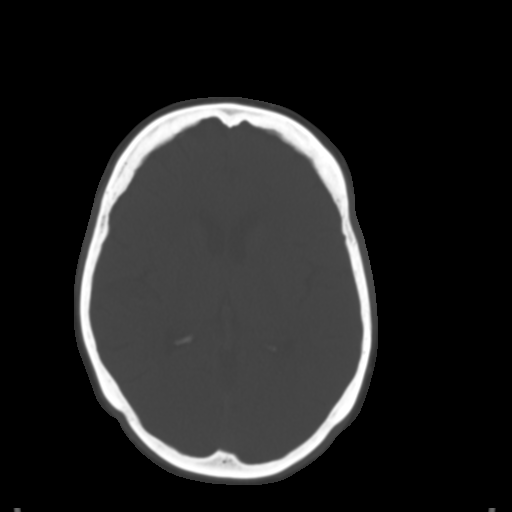
[im 19/31  brain]
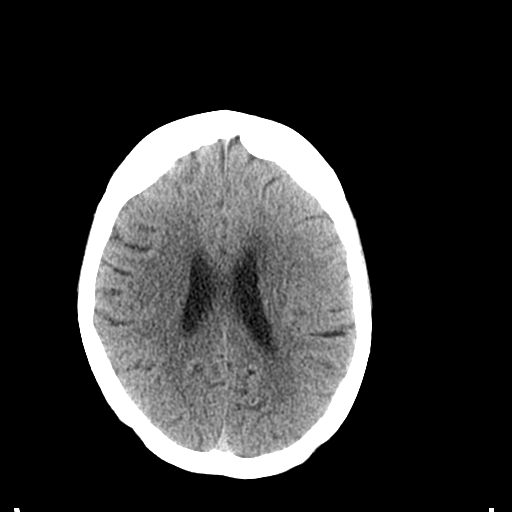
[im 22/31  brain]
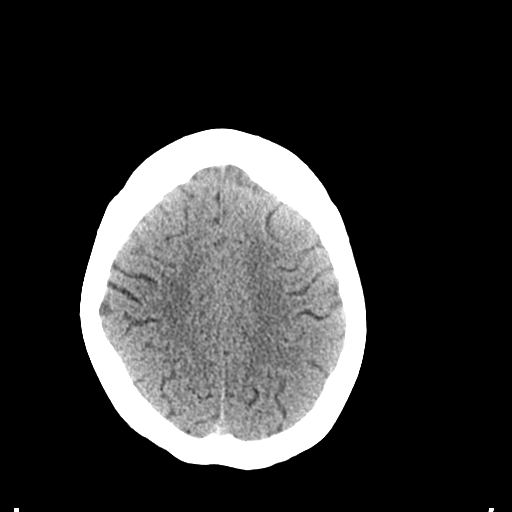
[im 25/31  brain]
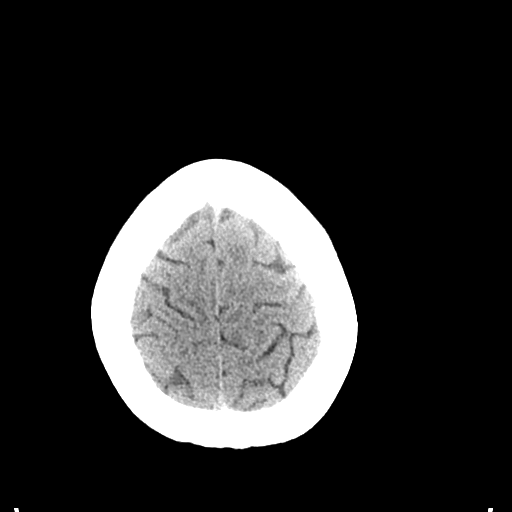
[im 28/31  brain]
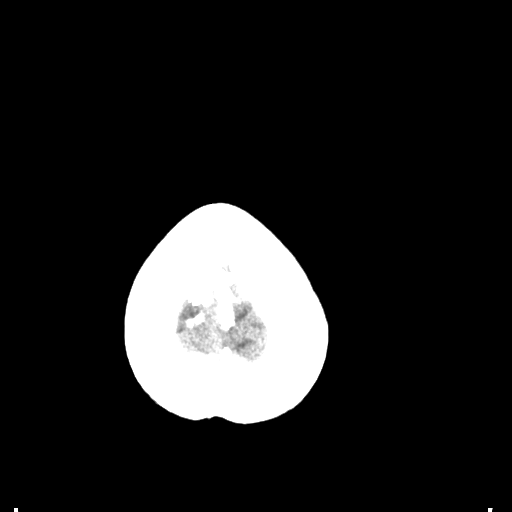
[im 28/31  bone]
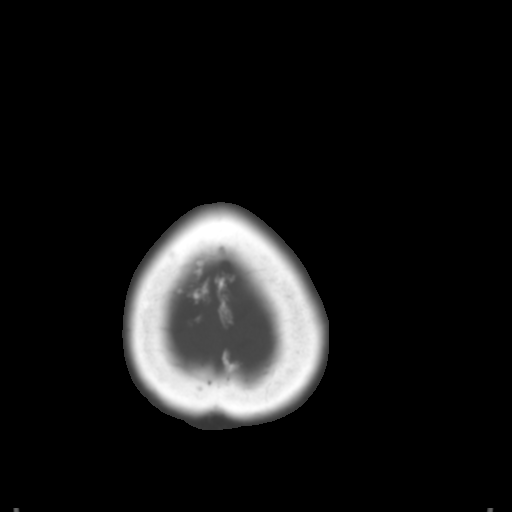

[Series 4: coronal soft tissue · coronal · 0.28mm/px · 3 of 59 slices shown]
[im 20/59  brain]
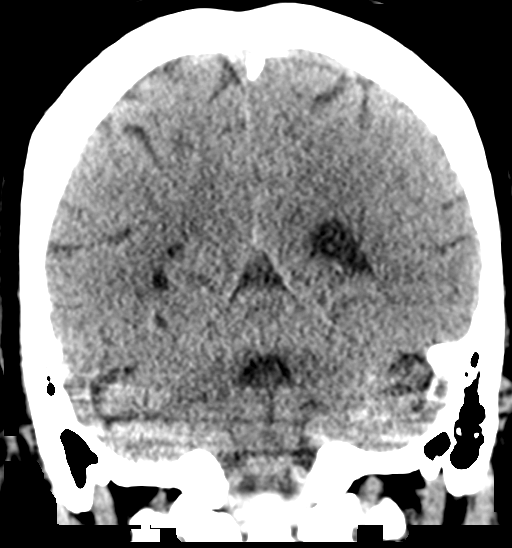
[im 26/59  brain]
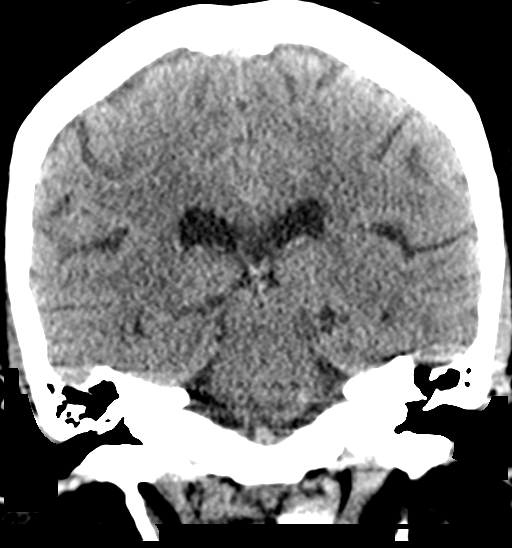
[im 33/59  brain]
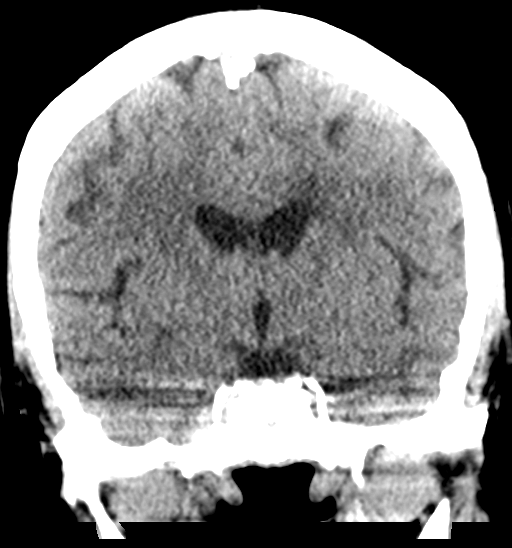

[Series 5: sagittal soft tissue · sagittal · 0.30mm/px · 3 of 48 slices shown]
[im 16/48  brain]
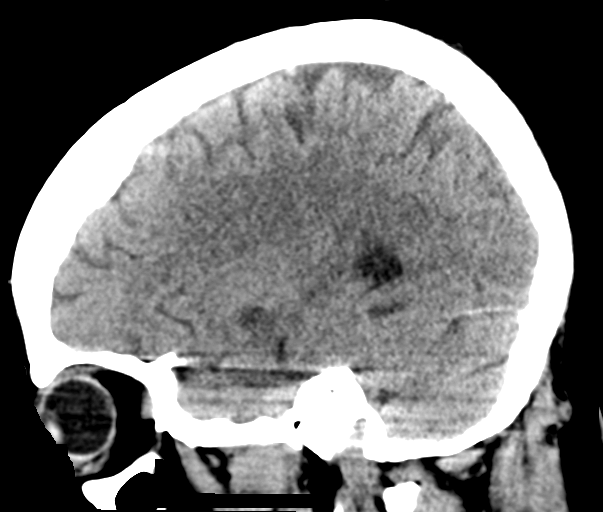
[im 24/48  brain]
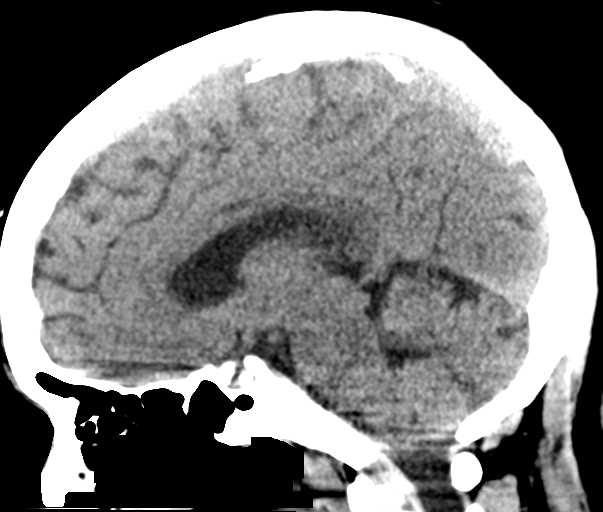
[im 32/48  brain]
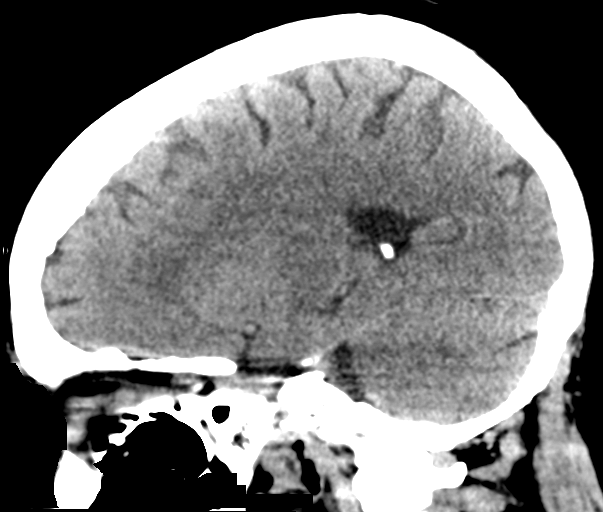

[15 of 47 positions shown; findings below may reference images not displayed]

FINDINGS: Brain: No evidence of acute infarction, hemorrhage, hydrocephalus,
extra-axial collection or mass lesion/mass effect.

Mild periventricular matter change likely reflects small vessel
ischemic microangiopathy.

The posterior fossa, including the cerebellum, brainstem and fourth
ventricle, is within normal limits. The third and lateral
ventricles, and basal ganglia are unremarkable in appearance. The
cerebral hemispheres are symmetric in appearance, with normal
gray-white differentiation. No mass effect or midline shift is seen.

Vascular: No hyperdense vessel or unexpected calcification.

Skull: There is no evidence of fracture; visualized osseous
structures are unremarkable in appearance.

Sinuses/Orbits: The visualized portions of the orbits are within
normal limits. Mucosal thickening is noted at the left maxillary
sinus. The remaining paranasal sinuses and mastoid air cells are
well-aerated.

Other: No significant soft tissue abnormalities are seen.
IMPRESSION: 1. No acute intracranial pathology seen on CT.
2. Mild small vessel ischemic microangiopathy.
3. Mucosal thickening at the left maxillary sinus.

## 2020-02-02 ENCOUNTER — Other Ambulatory Visit: Payer: Self-pay | Admitting: Internal Medicine

## 2020-02-07 LAB — HEMOGLOBIN A1C: Hemoglobin A1C: 7.3

## 2020-02-08 ENCOUNTER — Encounter: Payer: Self-pay | Admitting: Internal Medicine

## 2020-02-08 ENCOUNTER — Ambulatory Visit (INDEPENDENT_AMBULATORY_CARE_PROVIDER_SITE_OTHER): Payer: Medicare PPO | Admitting: Internal Medicine

## 2020-02-08 ENCOUNTER — Other Ambulatory Visit: Payer: Self-pay

## 2020-02-08 VITALS — BP 120/70 | HR 64 | Ht 62.0 in | Wt 155.0 lb

## 2020-02-08 DIAGNOSIS — R809 Proteinuria, unspecified: Secondary | ICD-10-CM

## 2020-02-08 DIAGNOSIS — E1129 Type 2 diabetes mellitus with other diabetic kidney complication: Secondary | ICD-10-CM

## 2020-02-08 DIAGNOSIS — E785 Hyperlipidemia, unspecified: Secondary | ICD-10-CM

## 2020-02-08 LAB — POCT GLYCOSYLATED HEMOGLOBIN (HGB A1C): Hemoglobin A1C: 6.9 % — AB (ref 4.0–5.6)

## 2020-02-08 NOTE — Progress Notes (Signed)
Patient ID: Ann [name], female   DOB: 1945/11/23, 74 y.o.   MRN: [id number]   This visit occurred during the SARS-CoV-2 public health emergency.  Safety protocols were in place, including screening questions prior to the visit, additional usage of staff PPE, and extensive cleaning of exam room while observing appropriate contact time as indicated for disinfecting solutions.   HPI: Ann [name] is a 74 y.o.-year-old female, initially referred by Dr. Orland Mustard, now returning for f/u for DM2, dx in 2006, non-insulin-dependent, uncontrolled, with complications (microalbuminuria). Last visit 4 months ago. PCP: DR. [name].  Reviewed HbA1c levels: Lab Results  Component Value Date   HGBA1C 7.1 (A) 10/05/2019   HGBA1C 7.1 (A) 06/16/2019   HGBA1C 6.7 (A) 02/03/2019  02/25/2016: 11.2% Previously 9.7% Previously 8.9%  Pt is on a regimen of: - Metformin ER 500 mg 2x a day - Amaryl 4 mg in a.m. and 2 mg before dinner - Trulicity 1.5 mg weekly She was on Metformin 1000 mg 2x a day >> diarrhea >> but resolved. She refused insulin.  Pt checks her sugars sporadically: - am:  108, 128-130s, 145 >> 128-150 >> 119-146 >> 120-159 - 2h after b'fast: n/c - before lunch: n/c - 2h after lunch: n/c - before dinner: 102, 168 >> 130-150 >> n/c - 2h after dinner: 130s-150s >> 120s >> n/c >> 122 >> 130, 180 - bedtime:  140-156 >> 130s-160s, 201 >> n/c - nighttime: n/c >> 143-179 >> n/c Lowest sugar was 108 >> 120 >> 119 >> 120; she has hypoglycemia awareness in the 70s. Highest sugar was 158 >> 150s >> 146 >> 180 (sick)  Glucometer: Freestyle Lite  Pt's meals are: - Breakfast: cereal, boiled egg, juice - Lunch: sandwich, salad - Dinner: meat + salad, potato - Snacks: 3 She is on nutrition in the past.  No CKD, last BUN/creatinine:  02/07/2020: BUN/creatinine 12/0.98 06/05/2019: BUN/creatinine 11/0.91, glucose 128, Calcium normal at 9.6 01/11/2018: CMP with a high glucose of 175.   BUN/creatinine normal 11/0.84, with the rest of the CMP being normal Lab Results  Component Value Date   BUN 18 09/06/2017   Lab Results  Component Value Date   CREATININE 1.00 09/06/2017  06/30/2016: 14/0.92 02/25/2016: 16/0.92, EGFR 60/73. At that time, glucose was 259, with a sodium of 133.    She has a history of increased ACR: 02/07/2020: ACR 39.5 Lab Results  Component Value Date   MICRALBCREAT 1.7 09/30/2018  05/28/2015: Urine ACR 45.6 On olmesartan.  + HL;last set of lipids: 02/07/2020: 120/155/32/61 01/11/2018:  168/156/38/98 07/30/2017: 154/240/32/74 No results found for: CHOL, HDL, LDLCALC, LDLDIRECT, TRIG, CHOLHDL 06/30/2016: 163/186/33/92 05/28/2015: 170/173/34/102 She started on a statin in 01/2018.  - last eye exam was on 10/02/2019: No DR, + macular degeneration - needing laser surgery. Dr. Posey Pronto.  -She denies numbness and tingling in her feet.  ROS: Constitutional: no weight gain/no weight loss, no fatigue, no subjective hyperthermia, no subjective hypothermia Eyes: no blurry vision, no xerophthalmia ENT: no sore throat, no nodules palpated in neck, no dysphagia, no odynophagia, no hoarseness Cardiovascular: no CP/no SOB/no palpitations/no leg swelling Respiratory: no cough/no SOB/no wheezing Gastrointestinal: no N/no V/no D/no C/no acid reflux Musculoskeletal: no muscle aches/no joint aches Skin: no rashes, no hair loss Neurological: no tremors/no numbness/no tingling/no dizziness  I reviewed pt's medications, allergies, PMH, social hx, family hx, and changes were documented in the history of present illness. Otherwise, unchanged from my initial visit note.  Past Medical History:  Diagnosis Date  Diabetes mellitus without complication ([place])    Hypertension    Past Surgical History:  Procedure Laterality Date   ABDOMINAL HYSTERECTOMY     knee surgery  04/2013   Social History   Social History   Marital status: Married    Spouse name: N/A    Number of children: 3   Occupational History   Retired Tourist information centre manager   Social History Main Topics   Smoking status: Never Smoker   Smokeless tobacco: No   Alcohol use No   Drug use: no   Current Outpatient Medications on File Prior to Visit  Medication Sig Dispense Refill   allopurinol (ZYLOPRIM) 100 MG tablet Take 200 mg by mouth at bedtime.      amLODipine (NORVASC) 10 MG tablet Take 10 mg by mouth daily. Unknown dose     atenolol (TENORMIN) 50 MG tablet Take 50 mg by mouth daily. Unknown dose      Blood Glucose Monitoring Suppl (FREESTYLE LITE) DEVI Use to check blood sugar 2 times a day 1 each 0   glimepiride (AMARYL) 4 MG tablet TAKE 1 TABLET BEFORE BREAKFAST AND 1/2 TABLET BEFORE DINNER 135 tablet 1   glucose blood (FREESTYLE TEST STRIPS) test strip Use 2x a day 100 each 11   metFORMIN (GLUCOPHAGE-XR) 500 MG 24 hr tablet TAKE 1 TABLET (500 MG TOTAL) BY MOUTH 2 (TWO) TIMES DAILY WITH A MEAL. 180 tablet 0   montelukast (SINGULAIR) 10 MG tablet Take 10 mg by mouth at bedtime. Unknown dose     olmesartan (BENICAR) 20 MG tablet Take 20 mg by mouth daily.  0   omeprazole (PRILOSEC) 20 MG capsule Take by mouth daily.     TRULICITY 1.5 VW/9.7XY SOPN INJECT INTO THE SKIN WEEKLY. 6 pen 3   zolpidem (AMBIEN) 10 MG tablet Take 10 mg by mouth at bedtime as needed for sleep.     No current facility-administered medications on file prior to visit.   Allergies  Allergen Reactions   Sulfa Antibiotics     FH: - Diabetes in mother, sister, brother - Hypertension in mother and sister - Hyperlipidemia in brother  PE: BP 120/70    Pulse 64    Ht _0  (1.575 m)    Wt 155 lb (70.3 kg)    SpO2 98%    BMI 28.35 kg/m  Wt Readings from Last 3 Encounters:  02/08/20 155 lb (70.3 kg)  10/05/19 159 lb (72.1 kg)  06/16/19 157 lb (71.2 kg)   Constitutional:  Normal weight, in NAD Eyes: PERRLA, EOMI, no exophthalmos ENT: moist mucous membranes, no thyromegaly, no cervical  lymphadenopathy Cardiovascular: RRR, No MRG Respiratory: CTA B Gastrointestinal: abdomen soft, NT, ND, BS+ Musculoskeletal: no deformities, strength intact in all 4 Skin: moist, warm, no rashes Neurological: no tremor with outstretched hands, DTR normal in all 4  ASSESSMENT: 1. DM2, non-insulin-dependent, uncontrolled, with complications - microalbuminuria  2. HL  3. MAU  PLAN:  1. Patient with longstanding, previously uncontrolled diabetes, with better control after adding Trulicity to her oral regimen.  In the past, she was forgetting Trulicity doses and I advised her to set alarms on her phone to take it. -At last visit, sugars were similar to before, but she was not checking frequently and we discussed about the need to check once a day, rotating check times. HbA1c obtained at last visit, was at goal for age, at 7.1%, stable. - since last OV, she did not check sugars consistently. She mentions that sugars are  usually at goal with occasional slighly higher CBGs -I strongly advised him to start checking sugars consistently, once a day, rotating check times -However, I will not change her regimen at this visit especially since her HbA1c: 6.9% (Better) - I suggested to:  Patient Instructions  Please continue: - Metformin ER 500 mg 2x a day - Amaryl 4 mg in am and 2 mg before dinner - Trulicity 1.5 mg weekly   Please return in 4 months with your sugar log.   - advised to check sugars at different times of the day - 1x a day, rotating check times - advised for yearly eye exams >> she is UTD -She also lost 4 pounds since last visit! - return to clinic in 4 months  2. HL -Reviewed lipid panel from yesterday: LDL at goal, triglycerides only slightly high -Continue the statin without side effects  3. MAU -She has a history of microalbuminuria, but this normalized at last check in 2020 -he had this checked yesterday >> I was able to obtain the records ACR is only slightly higher  than goal at 39.5 -Continues on olmesartan  Philemon Kingdom, MD PhD [facility] Endocrinology

## 2020-02-08 NOTE — Patient Instructions (Signed)
Please continue: - Metformin ER 500 mg 2x a day - Amaryl 4 mg in am and 2 mg before dinner - Trulicity 1.5 mg weekly   Please return in 4 months with your sugar log.  

## 2020-02-08 NOTE — Addendum Note (Signed)
Addended by: Cardell Peach I on: 02/08/2020 01:23 PM   Modules accepted: Orders

## 2020-02-12 ENCOUNTER — Encounter: Payer: Self-pay | Admitting: Internal Medicine

## 2020-02-26 ENCOUNTER — Other Ambulatory Visit: Payer: Self-pay | Admitting: Internal Medicine

## 2020-03-05 DIAGNOSIS — R945 Abnormal results of liver function studies: Secondary | ICD-10-CM | POA: Diagnosis not present

## 2020-03-06 DIAGNOSIS — H35053 Retinal neovascularization, unspecified, bilateral: Secondary | ICD-10-CM | POA: Diagnosis not present

## 2020-03-06 DIAGNOSIS — H3562 Retinal hemorrhage, left eye: Secondary | ICD-10-CM | POA: Diagnosis not present

## 2020-03-06 DIAGNOSIS — H43393 Other vitreous opacities, bilateral: Secondary | ICD-10-CM | POA: Diagnosis not present

## 2020-03-06 DIAGNOSIS — H35371 Puckering of macula, right eye: Secondary | ICD-10-CM | POA: Diagnosis not present

## 2020-04-23 DIAGNOSIS — Z23 Encounter for immunization: Secondary | ICD-10-CM | POA: Diagnosis not present

## 2020-04-23 DIAGNOSIS — H35052 Retinal neovascularization, unspecified, left eye: Secondary | ICD-10-CM | POA: Diagnosis not present

## 2020-04-28 ENCOUNTER — Other Ambulatory Visit: Payer: Self-pay | Admitting: Internal Medicine

## 2020-05-02 DIAGNOSIS — Z130 Encounter for screening for diseases of the blood and blood-forming organs and certain disorders involving the immune mechanism: Secondary | ICD-10-CM | POA: Diagnosis not present

## 2020-06-05 ENCOUNTER — Other Ambulatory Visit (HOSPITAL_COMMUNITY): Payer: Self-pay | Admitting: Internal Medicine

## 2020-06-05 ENCOUNTER — Ambulatory Visit: Payer: Medicare PPO | Attending: Internal Medicine

## 2020-06-05 DIAGNOSIS — Z23 Encounter for immunization: Secondary | ICD-10-CM

## 2020-06-05 NOTE — Progress Notes (Signed)
   Covid-19 Vaccination Clinic  Name:  KATY BRICKELL    MRN: [id number] DOB: 1946/06/05  06/05/2020  Ann [name] was observed post Covid-19 immunization for 15 minutes without incident. She was provided with Vaccine Information Sheet and instruction to access the V-Safe system.   Ms. Prouse was instructed to call 911 with any severe reactions post vaccine: Marland Kitchen Difficulty breathing  . Swelling of face and throat  . A fast heartbeat  . A bad rash all over body  . Dizziness and weakness

## 2020-06-13 ENCOUNTER — Other Ambulatory Visit: Payer: Self-pay

## 2020-06-13 ENCOUNTER — Ambulatory Visit: Payer: Medicare PPO | Admitting: Internal Medicine

## 2020-06-13 ENCOUNTER — Encounter: Payer: Self-pay | Admitting: Internal Medicine

## 2020-06-13 VITALS — BP 145/85 | HR 72 | Ht 62.0 in | Wt 155.8 lb

## 2020-06-13 DIAGNOSIS — E1129 Type 2 diabetes mellitus with other diabetic kidney complication: Secondary | ICD-10-CM | POA: Diagnosis not present

## 2020-06-13 DIAGNOSIS — E785 Hyperlipidemia, unspecified: Secondary | ICD-10-CM | POA: Diagnosis not present

## 2020-06-13 DIAGNOSIS — R809 Proteinuria, unspecified: Secondary | ICD-10-CM | POA: Diagnosis not present

## 2020-06-13 LAB — POCT GLYCOSYLATED HEMOGLOBIN (HGB A1C): Hemoglobin A1C: 7.1 % — AB (ref 4.0–5.6)

## 2020-06-13 MED ORDER — PRAVASTATIN SODIUM 10 MG PO TABS: 10.0000 mg | ORAL_TABLET | Freq: Every day | ORAL | 3 refills | Status: AC

## 2020-06-13 NOTE — Addendum Note (Signed)
Addended by: Lauralyn Primes on: 06/13/2020 04:11 PM   Modules accepted: Orders

## 2020-06-13 NOTE — Patient Instructions (Addendum)
Please continue: ?- Metformin ER 500 mg 2x a day ?- Amaryl 4 mg in am and 2-4 mg before dinner ?- Trulicity 1.5 mg weekly ? ? Please return in 4 months with your sugar log.  ?

## 2020-06-13 NOTE — Progress Notes (Signed)
Patient ID: Ann [name], female   DOB: 1945/08/15, 74 y.o.   MRN: [id number]   This visit occurred during the SARS-CoV-2 public health emergency.  Safety protocols were in place, including screening questions prior to the visit, additional usage of staff PPE, and extensive cleaning of exam room while observing appropriate contact time as indicated for disinfecting solutions.   HPI: Ann [name] is a 74 y.o.-year-old female, initially referred by Dr. Orland Mustard, now returning for f/u for DM2, dx in 2006, non-insulin-dependent, uncontrolled, with complications (microalbuminuria). Last visit 4 months ago. PCP: DR. [name].  Reviewed HbA1c levels: Lab Results  Component Value Date   HGBA1C 6.9 (A) 02/08/2020   HGBA1C 7.3 02/07/2020   HGBA1C 7.1 (A) 10/05/2019   HGBA1C 7.1 (A) 06/16/2019   HGBA1C 6.7 (A) 02/03/2019   HGBA1C 7.3 (A) 09/30/2018   HGBA1C 6.5 (A) 06/06/2018   HGBA1C 7.9 (A) 03/15/2018   HGBA1C 6.7 10/07/2017   HGBA1C 6.7 06/03/2017   HGBA1C 6.8 02/24/2017   HGBA1C 7.0 11/12/2016   HGBA1C 6.8 08/11/2016   HGBA1C 9.5 04/28/2016  02/25/2016: 11.2% Previously 9.7% Previously 8.9%  Pt is on a regimen of: - Metformin ER 500 mg 2x a day - Amaryl 4 mg in a.m. and 2 mg  >> at bedtime - Trulicity 1.5 mg weekly She was on Metformin 1000 mg 2x a day >> diarrhea >> but resolved. She refused insulin.  Pt checks her sugars sporadically: - am:  128-150 >> 119-146 >> 120-159 >> 124-150s - 2h after b'fast: n/c - before lunch: n/c - 2h after lunch: n/c - before dinner: 102, 168 >> 130-150 >> n/c >> 120-130 - 2h after dinner: 120s >> n/c >> 122 >> 130, 180 >> 130s - bedtime:  140-156 >> 130s-160s, 201 >> n/c - nighttime: n/c >> 143-179 >> n/c Lowest sugar was 119 >> 120; she has hypoglycemia awareness in the 70s. Highest sugar was 146 >> 180 (sick)  Glucometer: Freestyle Lite  Pt's meals are: - Breakfast: cereal, boiled egg, juice - Lunch: sandwich, salad - Dinner:  meat + salad, potato - Snacks: 3 She saw nutrition in the past.  No CKD, last BUN/creatinine:  02/07/2020: BUN/creatinine 12/0.98 06/05/2019: BUN/creatinine 11/0.91, glucose 128, Calcium normal at 9.6 01/11/2018: CMP with a high glucose of 175.  BUN/creatinine normal 11/0.84, with the rest of the CMP being normal Lab Results  Component Value Date   BUN 18 09/06/2017   Lab Results  Component Value Date   CREATININE 1.00 09/06/2017  06/30/2016: 14/0.92 02/25/2016: 16/0.92, EGFR 60/73. At that time, glucose was 259, with a sodium of 133.    He has a history of elevated ACR 02/07/2020: ACR 39.5 Lab Results  Component Value Date   MICRALBCREAT 1.7 09/30/2018  05/28/2015: Urine ACR 45.6 On olmesartan.  + HL;last set of lipids: 02/07/2020: 120/155/32/61 01/11/2018:  168/156/38/98 07/30/2017: 154/240/32/74 No results found for: CHOL, HDL, LDLCALC, LDLDIRECT, TRIG, CHOLHDL 06/30/2016: 163/186/33/92 05/28/2015: 170/173/34/102 She started the Pravastatin 10  In 01/2018.  - last eye exam was in 09/2019: No DR, + macular degeneration.  She had laser surgery OS.  Dr. Thana Ates.  - no numbness and tingling in her feet.  ROS: Constitutional: no weight gain/no weight loss, no fatigue, no subjective hyperthermia, no subjective hypothermia Eyes: no blurry vision, no xerophthalmia ENT: no sore throat, no nodules palpated in neck, no dysphagia, no odynophagia, no hoarseness Cardiovascular: no CP/no SOB/no palpitations/no leg swelling Respiratory: no cough/no SOB/no wheezing Gastrointestinal: no N/no V/no  D/no C/no acid reflux Musculoskeletal: no muscle aches/no joint aches Skin: no rashes, no hair loss Neurological: no tremors/no numbness/no tingling/no dizziness  I reviewed pt's medications, allergies, PMH, social hx, family hx, and changes were documented in the history of present illness. Otherwise, unchanged from my initial visit note.  Past Medical History:  Diagnosis Date  . Diabetes  mellitus without complication ([place])   . Hypertension    Past Surgical History:  Procedure Laterality Date  . ABDOMINAL HYSTERECTOMY    . knee surgery  04/2013   Social History   Social History  . Marital status: Married    Spouse name: N/A  . Number of children: 3   Occupational History  . Retired Tourist information centre manager   Social History Main Topics  . Smoking status: Never Smoker  . Smokeless tobacco: No  . Alcohol use No  . Drug use: no   Current Outpatient Medications on File Prior to Visit  Medication Sig Dispense Refill  . allopurinol (ZYLOPRIM) 100 MG tablet Take 200 mg by mouth at bedtime.     Marland Kitchen amLODipine (NORVASC) 10 MG tablet Take 10 mg by mouth daily. Unknown dose    . atenolol (TENORMIN) 50 MG tablet Take 50 mg by mouth daily. Unknown dose     . Blood Glucose Monitoring Suppl (FREESTYLE LITE) DEVI Use to check blood sugar 2 times a day 1 each 0  . glimepiride (AMARYL) 4 MG tablet TAKE 1 TABLET BEFORE BREAKFAST AND 1/2 TABLET BEFORE DINNER 135 tablet 1  . glucose blood (FREESTYLE TEST STRIPS) test strip Use 2x a day 100 each 11  . metFORMIN (GLUCOPHAGE-XR) 500 MG 24 hr tablet TAKE 1 TABLET (500 MG TOTAL) BY MOUTH 2 (TWO) TIMES DAILY WITH A MEAL. 180 tablet 0  . montelukast (SINGULAIR) 10 MG tablet Take 10 mg by mouth at bedtime. Unknown dose    . olmesartan (BENICAR) 20 MG tablet Take 20 mg by mouth daily.  0  . omeprazole (PRILOSEC) 20 MG capsule Take by mouth daily.    . TRULICITY 1.5 TD/3.2KG SOPN INJECT INTO THE SKIN WEEKLY. 6 pen 3  . zolpidem (AMBIEN) 10 MG tablet Take 10 mg by mouth at bedtime as needed for sleep.     No current facility-administered medications on file prior to visit.   Allergies  Allergen Reactions  . Sulfa Antibiotics     FH: - Diabetes in mother, sister, brother - Hypertension in mother and sister - Hyperlipidemia in brother  PE: BP (!) 145/85   Pulse 72   Ht 5' 2"  (1.575 m)   Wt 155 lb 12.8 oz (70.7 kg)   SpO2 98%   BMI 28.50 kg/m  Wt  Readings from Last 3 Encounters:  06/13/20 155 lb 12.8 oz (70.7 kg)  02/08/20 155 lb (70.3 kg)  10/05/19 159 lb (72.1 kg)   Constitutional: normal weight, in NAD Eyes: PERRLA, EOMI, no exophthalmos ENT: moist mucous membranes, no thyromegaly, no cervical lymphadenopathy Cardiovascular: RRR, No MRG Respiratory: CTA B Gastrointestinal: abdomen soft, NT, ND, BS+ Musculoskeletal: no deformities, strength intact in all 4 Skin: moist, warm, no rashes Neurological: no tremor with outstretched hands, DTR normal in all 4  ASSESSMENT: 1. DM2, non-insulin-dependent, uncontrolled, with complications - microalbuminuria  2. HL  3. MAU  PLAN:  1. Patient with longstanding, previously uncontrolled diabetes with improvement in control after adding Trulicity to her oral regimen of Metformin and sulfonylurea.  In the past, she was forgetting Trulicity doses and I advised her to set  alarms on her phone to remember to take it. -At last visit, she was not checking sugars consistently but whenever she was checking they were at goal.  I advised her to try to check once a day rotating check times.  HbA1c at that time was 6.9%, improved. -At today's visit, sugars are still slightly higher in the morning and they are usually at goal later in the day. -Upon questioning, she is taking Amaryl at bedtime and we discussed about moving this 15 to 30 minutes before dinner.  I also advised her to increase the dose from half a tablet to 1 full tablet before a larger meal, for example Thanksgiving or Christmas dinner.  She tells me she would like to keep the Trulicity dose the same for now.  However, she is open to increase it at next visit if sugars remain elevated. - I suggested to:  Patient Instructions  Please continue: - Metformin ER 500 mg 2x a day - Amaryl 4 mg in am and 2-4 mg before dinner - Trulicity 1.5 mg weekly   Please return in 4 months with your sugar log.   - we checked her HbA1c: 7.1% (slightly  higher) - advised to check sugars at different times of the day - 1x a day, rotating check times - advised for yearly eye exams >> she is UTD - return to clinic in 4 months  2. HL -Reviewed latest lipid panel from 01/2020: 120/155/32/61 -LDL at goal, triglycerides slightly high, HDL slightly low -Continues on a Pravastatin 10 milligrams daily >> no SEs  3. MAU -She has a history of microalbuminuria.  Latest level was from before our last visit and it was slightly elevated, 39.5. -We will continue olmesartan 20 mg daily  Philemon Kingdom, MD PhD [facility] Endocrinology

## 2020-06-26 DIAGNOSIS — H35371 Puckering of macula, right eye: Secondary | ICD-10-CM | POA: Diagnosis not present

## 2020-06-26 DIAGNOSIS — H3562 Retinal hemorrhage, left eye: Secondary | ICD-10-CM | POA: Diagnosis not present

## 2020-06-26 DIAGNOSIS — H35053 Retinal neovascularization, unspecified, bilateral: Secondary | ICD-10-CM | POA: Diagnosis not present

## 2020-06-26 DIAGNOSIS — H31092 Other chorioretinal scars, left eye: Secondary | ICD-10-CM | POA: Diagnosis not present

## 2020-06-26 DIAGNOSIS — H25813 Combined forms of age-related cataract, bilateral: Secondary | ICD-10-CM | POA: Diagnosis not present

## 2020-06-26 DIAGNOSIS — H43393 Other vitreous opacities, bilateral: Secondary | ICD-10-CM | POA: Diagnosis not present

## 2020-07-03 ENCOUNTER — Ambulatory Visit: Payer: Medicare PPO

## 2020-07-10 DIAGNOSIS — R945 Abnormal results of liver function studies: Secondary | ICD-10-CM | POA: Diagnosis not present

## 2020-07-10 DIAGNOSIS — F411 Generalized anxiety disorder: Secondary | ICD-10-CM | POA: Diagnosis not present

## 2020-07-10 DIAGNOSIS — D473 Essential (hemorrhagic) thrombocythemia: Secondary | ICD-10-CM | POA: Diagnosis not present

## 2020-07-10 DIAGNOSIS — I10 Essential (primary) hypertension: Secondary | ICD-10-CM | POA: Diagnosis not present

## 2020-07-10 DIAGNOSIS — L299 Pruritus, unspecified: Secondary | ICD-10-CM | POA: Diagnosis not present

## 2020-07-19 DIAGNOSIS — I10 Essential (primary) hypertension: Secondary | ICD-10-CM | POA: Diagnosis not present

## 2020-07-19 DIAGNOSIS — S39011A Strain of muscle, fascia and tendon of abdomen, initial encounter: Secondary | ICD-10-CM | POA: Diagnosis not present

## 2020-07-30 DIAGNOSIS — K219 Gastro-esophageal reflux disease without esophagitis: Secondary | ICD-10-CM | POA: Diagnosis not present

## 2020-07-30 DIAGNOSIS — H6983 Other specified disorders of Eustachian tube, bilateral: Secondary | ICD-10-CM | POA: Diagnosis not present

## 2020-08-06 DIAGNOSIS — D473 Essential (hemorrhagic) thrombocythemia: Secondary | ICD-10-CM | POA: Diagnosis not present

## 2020-08-06 DIAGNOSIS — R109 Unspecified abdominal pain: Secondary | ICD-10-CM | POA: Diagnosis not present

## 2020-08-13 ENCOUNTER — Other Ambulatory Visit: Payer: Self-pay | Admitting: Internal Medicine

## 2020-08-20 DIAGNOSIS — E119 Type 2 diabetes mellitus without complications: Secondary | ICD-10-CM | POA: Diagnosis not present

## 2020-08-20 DIAGNOSIS — H25013 Cortical age-related cataract, bilateral: Secondary | ICD-10-CM | POA: Diagnosis not present

## 2020-08-20 DIAGNOSIS — H524 Presbyopia: Secondary | ICD-10-CM | POA: Diagnosis not present

## 2020-08-22 ENCOUNTER — Other Ambulatory Visit: Payer: Self-pay | Admitting: Internal Medicine

## 2020-09-19 ENCOUNTER — Telehealth: Payer: Self-pay | Admitting: Internal Medicine

## 2020-09-19 NOTE — Telephone Encounter (Signed)
Received a new hem referral from Dr. Orland Mustard for thrombocytopenia. Ms. Ann [name] cld and has been scheduled to see Dr. Julien Nordmann on 3/8 at 11:45am w/labs at 11:15am. Pt aware to arrive 15 minutes early.

## 2020-09-30 ENCOUNTER — Other Ambulatory Visit: Payer: Self-pay | Admitting: Internal Medicine

## 2020-09-30 DIAGNOSIS — D75839 Thrombocytosis, unspecified: Secondary | ICD-10-CM

## 2020-10-01 ENCOUNTER — Other Ambulatory Visit: Payer: Self-pay

## 2020-10-01 ENCOUNTER — Telehealth: Payer: Self-pay | Admitting: Internal Medicine

## 2020-10-01 ENCOUNTER — Inpatient Hospital Stay: Payer: Medicare PPO

## 2020-10-01 ENCOUNTER — Inpatient Hospital Stay: Payer: Medicare PPO | Attending: Internal Medicine | Admitting: Internal Medicine

## 2020-10-01 VITALS — BP 133/65 | HR 71 | Temp 97.8°F | Resp 15 | Ht 62.0 in | Wt 156.2 lb

## 2020-10-01 DIAGNOSIS — D473 Essential (hemorrhagic) thrombocythemia: Secondary | ICD-10-CM

## 2020-10-01 DIAGNOSIS — D72829 Elevated white blood cell count, unspecified: Secondary | ICD-10-CM | POA: Diagnosis not present

## 2020-10-01 DIAGNOSIS — Z7984 Long term (current) use of oral hypoglycemic drugs: Secondary | ICD-10-CM | POA: Diagnosis not present

## 2020-10-01 DIAGNOSIS — E119 Type 2 diabetes mellitus without complications: Secondary | ICD-10-CM | POA: Insufficient documentation

## 2020-10-01 DIAGNOSIS — I10 Essential (primary) hypertension: Secondary | ICD-10-CM | POA: Diagnosis not present

## 2020-10-01 DIAGNOSIS — D75839 Thrombocytosis, unspecified: Secondary | ICD-10-CM

## 2020-10-01 DIAGNOSIS — E611 Iron deficiency: Secondary | ICD-10-CM | POA: Diagnosis not present

## 2020-10-01 LAB — CBC WITH DIFFERENTIAL (CANCER CENTER ONLY)
Abs Immature Granulocytes: 0.07 10*3/uL (ref 0.00–0.07)
Basophils Absolute: 0.2 10*3/uL — ABNORMAL HIGH (ref 0.0–0.1)
Basophils Relative: 2 %
Eosinophils Absolute: 0.3 10*3/uL (ref 0.0–0.5)
Eosinophils Relative: 3 %
HCT: 47.3 % — ABNORMAL HIGH (ref 36.0–46.0)
Hemoglobin: 15 g/dL (ref 12.0–15.0)
Immature Granulocytes: 1 %
Lymphocytes Relative: 22 %
Lymphs Abs: 3.1 10*3/uL (ref 0.7–4.0)
MCH: 25 pg — ABNORMAL LOW (ref 26.0–34.0)
MCHC: 31.7 g/dL (ref 30.0–36.0)
MCV: 78.7 fL — ABNORMAL LOW (ref 80.0–100.0)
Monocytes Absolute: 0.5 10*3/uL (ref 0.1–1.0)
Monocytes Relative: 4 %
Neutro Abs: 9.6 10*3/uL — ABNORMAL HIGH (ref 1.7–7.7)
Neutrophils Relative %: 68 %
Platelet Count: 557 10*3/uL — ABNORMAL HIGH (ref 150–400)
RBC: 6.01 MIL/uL — ABNORMAL HIGH (ref 3.87–5.11)
RDW: 15.5 % (ref 11.5–15.5)
WBC Count: 13.8 10*3/uL — ABNORMAL HIGH (ref 4.0–10.5)
nRBC: 0 % (ref 0.0–0.2)

## 2020-10-01 LAB — CMP (CANCER CENTER ONLY)
ALT: 34 U/L (ref 0–44)
AST: 35 U/L (ref 15–41)
Albumin: 4.3 g/dL (ref 3.5–5.0)
Alkaline Phosphatase: 98 U/L (ref 38–126)
Anion gap: 9 (ref 5–15)
BUN: 16 mg/dL (ref 8–23)
CO2: 26 mmol/L (ref 22–32)
Calcium: 9.3 mg/dL (ref 8.9–10.3)
Chloride: 101 mmol/L (ref 98–111)
Creatinine: 1.11 mg/dL — ABNORMAL HIGH (ref 0.44–1.00)
GFR, Estimated: 52 mL/min — ABNORMAL LOW (ref >60)
Glucose, Bld: 159 mg/dL — ABNORMAL HIGH (ref 70–99)
Potassium: 3.5 mmol/L (ref 3.5–5.1)
Sodium: 136 mmol/L (ref 135–145)
Total Bilirubin: 1 mg/dL (ref 0.3–1.2)
Total Protein: 9.7 g/dL — ABNORMAL HIGH (ref 6.5–8.1)

## 2020-10-01 LAB — IRON AND TIBC
Iron: 62 ug/dL (ref 41–142)
Saturation Ratios: 15 % — ABNORMAL LOW (ref 21–57)
TIBC: 400 ug/dL (ref 236–444)
UIBC: 338 ug/dL (ref 120–384)

## 2020-10-01 LAB — FERRITIN: Ferritin: 42 ng/mL (ref 11–307)

## 2020-10-01 LAB — LACTATE DEHYDROGENASE: LDH: 225 U/L — ABNORMAL HIGH (ref 98–192)

## 2020-10-01 NOTE — Telephone Encounter (Signed)
Scheduled per los. Pushed f/u out by 1 week per patient request due to having a tooth pulled.

## 2020-10-01 NOTE — Patient Instructions (Signed)
Essential Thrombocythemia  Essential thrombocythemia is a condition in which a person has too many platelets (thrombocytes) in the blood. Platelets are parts of blood that stick together and form a clot (thrombus) to help the body stop bleeding after an injury. This condition may also be called primary or essential thrombocytosis. Essential thrombocythemia happens when abnormal cells in the bone marrow (megakaryocytes) make too many platelets. What are the causes? The cause of this condition is not known. What are the signs or symptoms? This condition may not cause any symptoms. If you have symptoms, they may include:  Weakness.  Headache.  Itching.  Sweating.  Fever.  Dizziness or confusion.  Tingling or burning in your hands or feet.  Blood clots.  Bleeding.  Enlarged spleen. How is this diagnosed? This condition may be diagnosed based on:  A physical exam.  Your symptoms.  Your medical history.  Blood tests.  A procedure to collect a sample of your bone marrow (bone marrow aspiration) for testing. How is this treated? If you do not have symptoms, you may not need treatment. Your health care provider may monitor your condition with regular blood tests. If you have symptoms, or if your platelet count is very high, you may be treated with:  Aspirin or other medicines to thin the blood and prevent blood clots.  Medicines to reduce the number of platelets in your blood.  A procedure to remove some platelets from your blood (plateletpheresis). During this procedure: ? Your health care provider will place an IV into one of your veins. ? The IV will be used to draw blood into a machine that separates out the extra platelets. ? The blood with reduced platelets will be returned to your body. Follow these instructions at home:  Take over-the-counter and prescription medicines only as told by your health care provider.  If you are taking blood thinners: ? Talk with  your health care provider before you take any medicines that contain aspirin or NSAIDs. These medicines increase your risk for dangerous bleeding. ? Take your medicine exactly as told, at the same time every day. ? Avoid activities that could cause injury or bruising, and follow instructions about how to prevent falls. ? Wear a medical alert bracelet or carry a card that lists what medicines you take.  Tell all health care providers, including dentists, about any medicines you are taking to prevent blood clots.  Do not use any products that contain nicotine or tobacco, such as cigarettes and e-cigarettes. If you need help quitting, ask your health care provider.  Ask your health care provider about managing or preventing high cholesterol, high blood pressure, and diabetes. These conditions can make essential thrombocythemia worse.  Keep all follow-up visits as told by your health care provider. This is important. Contact a health care provider if:  You have severe pain, and medicines do not help.  You have problems taking your medicines to prevent blood clots.  You faint. Get help right away if:  You have bleeding or blood clots.  You have unusual bruises.  You have bloody or tarry stools.  You have pink or bloody urine.  Your menstrual periods are heavier than normal, if applicable.  You have nosebleeds and bleeding gums.  You have chest pain.  You have trouble breathing.  You have any symptoms of a stroke. "BE FAST" is an easy way to remember the main warning signs of a stroke: ? B - Balance. Signs are dizziness, sudden trouble walking, or  loss of balance. ? E - Eyes. Signs are trouble seeing or a sudden change in vision. ? F - Face. Signs are sudden weakness or numbness of the face, or the face or eyelid drooping on one side. ? A - Arm. Signs are weakness or numbness in an arm. This happens suddenly and usually on one side of the body. ? S - Speech. Signs are sudden  trouble speaking, slurred speech, or trouble understanding what people say. ? T - Time. Time to call emergency services. Write down what time symptoms started.  You have other signs of a stroke, such as: ? A sudden, severe headache with no known cause. ? Nausea or vomiting. ? Seizure. These symptoms may represent a serious problem that is an emergency. Do not wait to see if the symptoms will go away. Get medical help right away. Call your local emergency services (911 in the [place] Do not drive yourself to the hospital.   Summary  Essential thrombocythemia happens when abnormal cells in the bone marrow make too many platelets.  If you have symptoms, or if your platelet count is very high, you may need treatment.  Treatment can vary and may include medicines to thin the blood and prevent blood clots.  Ask your health care provider about how to manage or prevent high cholesterol, high blood pressure, and diabetes. These conditions can make essential thrombocythemia worse.  Get help right away if you have any symptoms of stroke. This information is not intended to replace advice given to you by your health care provider. Make sure you discuss any questions you have with your health care provider. Document Revised: 12/27/2019 Document Reviewed: 12/27/2019 Elsevier Patient Education  2021 [employer].

## 2020-10-01 NOTE — Progress Notes (Signed)
[place] [phone]   [phone]  CONSULT NOTE  REFERRING PHYSICIAN: Dr. London [name]  REASON FOR CONSULTATION:  75 years old African-American female presented for evaluation of thrombocytosis.  HPI Ann [name] is a 75 y.o. female with past medical history significant for diabetes mellitus and hypertension as well as suspicious dyslipidemia.  The patient was seen by her primary care physician Dr. Orland [name] for routine evaluation and repeat CBC on August 06, 2020 showed elevated white blood count of 11.1 as well as elevated platelets count of 481,000.  She has normal hemoglobin and hematocrit.  Previous CBC on July 10, 2020 showed elevated platelets count of 529,000.  The patient was referred to me today for evaluation and recommendation regarding her persistent thrombocytosis. When seen today she is feeling fine with no concerning complaints except for itching after showers and also at nighttime.  She has no significant weight loss or night sweats.  She has no nausea, vomiting, diarrhea or constipation.  She has no headache or visual changes.  The patient has no chest pain, shortness of breath, cough or hemoptysis.  She denied having any bleeding, bruises or ecchymosis. Family history remarkable for mother with dementia and TIAs.  Father had COPD and brother with heart disease. The patient is married and has 4 children.  She is a retired Automotive engineer.  She was accompanied today by her husband Ann [name].  The patient has no history for smoking, alcohol or drug abuse.  HPI  Past Medical History:  Diagnosis Date  . Diabetes mellitus without complication ([place])   . Hypertension     Past Surgical History:  Procedure Laterality Date  . ABDOMINAL HYSTERECTOMY    . knee surgery  04/2013    No family history on file.  Social History Social History   Tobacco Use  . Smoking status: Never Smoker  . Smokeless tobacco: Never Used   Substance Use Topics  . Alcohol use: No  . Drug use: No    Allergies  Allergen Reactions  . Sulfa Antibiotics     Current Outpatient Medications  Medication Sig Dispense Refill  . allopurinol (ZYLOPRIM) 100 MG tablet Take 200 mg by mouth at bedtime.     Marland Kitchen amLODipine (NORVASC) 10 MG tablet Take 10 mg by mouth daily. Unknown dose    . atenolol (TENORMIN) 50 MG tablet Take 50 mg by mouth daily. Unknown dose     . Blood Glucose Monitoring Suppl (FREESTYLE LITE) DEVI Use to check blood sugar 2 times a day 1 each 0  . glimepiride (AMARYL) 4 MG tablet TAKE 1 TABLET BEFORE BREAKFAST AND 1/2 TABLET BEFORE DINNER 135 tablet 1  . glucose blood (FREESTYLE TEST STRIPS) test strip Use 2x a day 100 each 11  . metFORMIN (GLUCOPHAGE-XR) 500 MG 24 hr tablet TAKE 1 TABLET (500 MG TOTAL) BY MOUTH 2 (TWO) TIMES DAILY WITH A MEAL. 180 tablet 0  . montelukast (SINGULAIR) 10 MG tablet Take 10 mg by mouth at bedtime. Unknown dose    . olmesartan (BENICAR) 20 MG tablet Take 20 mg by mouth daily.  0  . omeprazole (PRILOSEC) 20 MG capsule Take by mouth daily.    . pravastatin (PRAVACHOL) 10 MG tablet Take 1 tablet (10 mg total) by mouth daily. 90 tablet 3  . TRULICITY 1.5 RW/4.3XV SOPN INJECT INTO THE SKIN WEEKLY. 6 pen 3  . zolpidem (AMBIEN) 10 MG tablet Take 10 mg by mouth at bedtime as needed for  sleep.     No current facility-administered medications for this visit.    Review of Systems  Constitutional: positive for fatigue Eyes: negative Ears, nose, mouth, throat, and face: negative Respiratory: negative Cardiovascular: negative Gastrointestinal: negative Genitourinary:negative Integument/breast: negative Hematologic/lymphatic: negative Musculoskeletal:negative Neurological: negative Behavioral/Psych: negative Endocrine: negative Allergic/Immunologic: negative  Physical Exam  CWC:BJSEG, healthy, no distress, well nourished, well developed and anxious SKIN: skin color, texture, turgor are  normal, no rashes or significant lesions HEAD: Normocephalic, No masses, lesions, tenderness or abnormalities EYES: normal, PERRLA, Conjunctiva are pink and non-injected EARS: External ears normal, Canals clear OROPHARYNX:no exudate, no erythema and lips, buccal mucosa, and tongue normal  NECK: supple, no adenopathy, no JVD LYMPH:  no palpable lymphadenopathy, no hepatosplenomegaly BREAST:not examined LUNGS: clear to auscultation , and palpation HEART: regular rate & rhythm, no murmurs and no gallops ABDOMEN:abdomen soft, non-tender, normal bowel sounds and no masses or organomegaly BACK: No CVA tenderness, Range of motion is normal EXTREMITIES:no joint deformities, effusion, or inflammation, no edema  NEURO: alert & oriented x 3 with fluent speech, no focal motor/sensory deficits  PERFORMANCE STATUS: ECOG 1  LABORATORY DATA: Lab Results  Component Value Date   WBC 13.8 (H) 10/01/2020   HGB 15.0 10/01/2020   HCT 47.3 (H) 10/01/2020   MCV 78.7 (L) 10/01/2020   PLT 557 (H) 10/01/2020      Chemistry      Component Value Date/Time   NA 142 09/06/2017 2346   K 4.2 09/06/2017 2346   CL 103 09/06/2017 2346   BUN 18 09/06/2017 2346   CREATININE 1.00 09/06/2017 2346   No results found for: CALCIUM, ALKPHOS, AST, ALT, BILITOT     RADIOGRAPHIC STUDIES: No results found.  ASSESSMENT: This is a very pleasant 75 years old African-American female presented for evaluation of thrombocythemia and leukocytosis.  This is likely to be secondary to myeloproliferative disorder but reactive condition could not be excluded at this point.    PLAN: I had a lengthy discussion with the patient and her husband today about her current condition and further investigation to confirm her diagnosis. I order several studies today including repeat CBC which showed persistent leukocytosis with total white blood count of 13.8.  The patient also has elevated platelets count of 557,000.  She has normal  hemoglobin and hematocrit.  She had similar results in 2019. I also order comprehensive metabolic panel which showed elevated serum protein as well as elevated serum creatinine and blood glucose. I will order serum protein electrophoresis with immunofixation in addition to JAK2 mutation panel. Her iron study and ferritin are unremarkable for severe iron deficiency. I will see the patient back for follow-up visit in 2 weeks for evaluation and discussion of her treatment options based on the pending lab results. She was advised to call immediately if she has any other concerning symptoms in the interval. The patient voices understanding of current disease status and treatment options and is in agreement with the current care plan.  All questions were answered. The patient knows to call the clinic with any problems, questions or concerns. We can certainly see the patient much sooner if necessary.  Thank you so much for allowing me to participate in the care of Ann [name]. I will continue to follow up the patient with you and assist in her care.  The total time spent in the appointment was 60 minutes.  Disclaimer: This note was dictated with voice recognition software. Similar sounding words can inadvertently be transcribed and  may not be corrected upon review.   Eilleen Kempf October 01, 2020, 11:53 AM

## 2020-10-03 LAB — PROTEIN ELECTROPHORESIS, SERUM, WITH REFLEX
A/G Ratio: 0.9 (ref 0.7–1.7)
Albumin ELP: 4.2 g/dL (ref 2.9–4.4)
Alpha-1-Globulin: 0.2 g/dL (ref 0.0–0.4)
Alpha-2-Globulin: 0.8 g/dL (ref 0.4–1.0)
Beta Globulin: 1.8 g/dL — ABNORMAL HIGH (ref 0.7–1.3)
Gamma Globulin: 2 g/dL — ABNORMAL HIGH (ref 0.4–1.8)
Globulin, Total: 4.8 g/dL — ABNORMAL HIGH (ref 2.2–3.9)
Total Protein ELP: 9 g/dL — ABNORMAL HIGH (ref 6.0–8.5)

## 2020-10-07 LAB — JAK2 (INCLUDING V617F AND EXON 12), MPL,& CALR W/RFL MPN PANEL (NGS)

## 2020-10-12 ENCOUNTER — Other Ambulatory Visit: Payer: Self-pay | Admitting: Internal Medicine

## 2020-10-17 ENCOUNTER — Encounter: Payer: Self-pay | Admitting: Internal Medicine

## 2020-10-17 ENCOUNTER — Other Ambulatory Visit: Payer: Self-pay

## 2020-10-17 ENCOUNTER — Ambulatory Visit: Payer: Medicare PPO | Admitting: Internal Medicine

## 2020-10-17 VITALS — BP 120/82 | HR 78 | Ht 62.0 in | Wt 156.4 lb

## 2020-10-17 DIAGNOSIS — E1129 Type 2 diabetes mellitus with other diabetic kidney complication: Secondary | ICD-10-CM | POA: Diagnosis not present

## 2020-10-17 DIAGNOSIS — E785 Hyperlipidemia, unspecified: Secondary | ICD-10-CM

## 2020-10-17 DIAGNOSIS — R809 Proteinuria, unspecified: Secondary | ICD-10-CM

## 2020-10-17 LAB — POCT GLYCOSYLATED HEMOGLOBIN (HGB A1C): Hemoglobin A1C: 7.1 % — AB (ref 4.0–5.6)

## 2020-10-17 NOTE — Progress Notes (Signed)
Patient ID: Ann [name], female   DOB: 08-Oct-1945, 75 y.o.   MRN: [id number]   This visit occurred during the SARS-CoV-2 public health emergency.  Safety protocols were in place, including screening questions prior to the visit, additional usage of staff PPE, and extensive cleaning of exam room while observing appropriate contact time as indicated for disinfecting solutions.   HPI: Ann [name] is a 75 y.o.-year-old female, initially referred by Dr. Orland Mustard, now returning for f/u for DM2, dx in 2006, non-insulin-dependent, uncontrolled, with complications (microalbuminuria). Last visit 4 months ago. PCP: DR. [name].  Interim history: At last visit, we moved her Amaryl from bedtime to before dinner. She recently saw hematology for Essential Thrombocytemia.  She was quite stressed during the investigation and did not check too many sugars since last OV.  Reviewed HbA1c levels: Lab Results  Component Value Date   HGBA1C 7.1 (A) 06/13/2020   HGBA1C 6.9 (A) 02/08/2020   HGBA1C 7.3 02/07/2020   HGBA1C 7.1 (A) 10/05/2019   HGBA1C 7.1 (A) 06/16/2019   HGBA1C 6.7 (A) 02/03/2019   HGBA1C 7.3 (A) 09/30/2018   HGBA1C 6.5 (A) 06/06/2018   HGBA1C 7.9 (A) 03/15/2018   HGBA1C 6.7 10/07/2017   HGBA1C 6.7 06/03/2017   HGBA1C 6.8 02/24/2017   HGBA1C 7.0 11/12/2016   HGBA1C 6.8 08/11/2016   HGBA1C 9.5 04/28/2016  02/25/2016: 11.2% Previously 9.7% Previously 8.9%  Pt is on a regimen of: - Metformin ER 500 mg 2x a day - Amaryl 4 mg in a.m. and 2 mg  >> at bedtime >> 2(mg before dinner - Trulicity 1.5 mg weekly She was on Metformin 1000 mg 2x a day >> diarrhea >> but resolved. She refused insulin.  Pt checks her sugars 0 to once a day: - am:  119-146 >> 120-159 >> 124-150s >> n/c - 2h after b'fast: n/c - before lunch: n/c - 2h after lunch: n/c - before dinner: 130-150 >> n/c >> 120-130 >> n/c - 2h after dinner:122 >> 130, 180 >> 130s >> n/c - bedtime:  140-156 >> 130s-160s,  201 >> n/c - nighttime: n/c >> 143-179 >> n/c Lowest sugar was 119 >> 120 >> ?; she has hypoglycemia awareness in the 70s. Highest sugar was 146 >> 180 (sick) >> ?.  Glucometer: Freestyle Lite  Pt's meals are: - Breakfast: cereal, boiled egg, juice - Lunch: sandwich, salad - Dinner: meat + salad, potato - Snacks: 3 She saw nutrition in the past.  No CKD, last BUN/creatinine:  02/07/2020: BUN/creatinine 12/0.98 06/05/2019: BUN/creatinine 11/0.91, glucose 128, Calcium normal at 9.6 01/11/2018: CMP with a high glucose of 175.  BUN/creatinine normal 11/0.84, with the rest of the CMP being normal Lab Results  Component Value Date   BUN 16 10/01/2020   Lab Results  Component Value Date   CREATININE 1.11 (H) 10/01/2020  06/30/2016: 14/0.92 02/25/2016: 16/0.92, EGFR 60/73. At that time, glucose was 259, with a sodium of 133.    He has a history of elevated ACR 02/07/2020: ACR 39.5 Lab Results  Component Value Date   MICRALBCREAT 1.7 09/30/2018  05/28/2015: Urine ACR 45.6 On olmesartan.  + HL;last set of lipids: 02/07/2020: 120/155/32/61 01/11/2018:  168/156/38/98 07/30/2017: 154/240/32/74 No results found for: CHOL, HDL, LDLCALC, LDLDIRECT, TRIG, CHOLHDL 06/30/2016: 163/186/33/92 05/28/2015: 170/173/34/102 She started the Pravastatin 10  In 01/2018.  - last eye exam was in 09/2019: No DR, + macular degeneration.  She had laser surgery OS.  Dr. Thana Ates.  - no numbness and tingling in  her feet.  ROS: Constitutional: no weight gain/no weight loss, no fatigue, no subjective hyperthermia, no subjective hypothermia Eyes: no blurry vision, no xerophthalmia ENT: no sore throat, no nodules palpated in neck, no dysphagia, no odynophagia, no hoarseness Cardiovascular: no CP/no SOB/no palpitations/no leg swelling Respiratory: no cough/no SOB/no wheezing Gastrointestinal: no N/no V/no D/no C/no acid reflux Musculoskeletal: no muscle aches/no joint aches Skin: no rashes, no hair  loss Neurological: no tremors/no numbness/no tingling/no dizziness  I reviewed pt's medications, allergies, PMH, social hx, family hx, and changes were documented in the history of present illness. Otherwise, unchanged from my initial visit note.  Past Medical History:  Diagnosis Date  . Diabetes mellitus without complication ([place])   . Hypertension    Past Surgical History:  Procedure Laterality Date  . ABDOMINAL HYSTERECTOMY    . knee surgery  04/2013   Social History   Social History  . Marital status: Married    Spouse name: N/A  . Number of children: 3   Occupational History  . Retired Tourist information centre manager   Social History Main Topics  . Smoking status: Never Smoker  . Smokeless tobacco: No  . Alcohol use No  . Drug use: no   Current Outpatient Medications on File Prior to Visit  Medication Sig Dispense Refill  . allopurinol (ZYLOPRIM) 100 MG tablet Take 200 mg by mouth at bedtime.     Marland Kitchen amLODipine (NORVASC) 10 MG tablet Take 10 mg by mouth daily. Unknown dose    . atenolol (TENORMIN) 50 MG tablet Take 50 mg by mouth daily. Unknown dose     . Blood Glucose Monitoring Suppl (FREESTYLE LITE) DEVI Use to check blood sugar 2 times a day 1 each 0  . glimepiride (AMARYL) 4 MG tablet TAKE 1 TABLET BEFORE BREAKFAST AND 1/2 TABLET BEFORE DINNER 135 tablet 1  . glucose blood (FREESTYLE TEST STRIPS) test strip Use 2x a day 100 each 11  . metFORMIN (GLUCOPHAGE-XR) 500 MG 24 hr tablet TAKE 1 TABLET (500 MG TOTAL) BY MOUTH 2 (TWO) TIMES DAILY WITH A MEAL. 180 tablet 0  . montelukast (SINGULAIR) 10 MG tablet Take 10 mg by mouth at bedtime. Unknown dose    . olmesartan (BENICAR) 20 MG tablet Take 20 mg by mouth daily.  0  . omeprazole (PRILOSEC) 20 MG capsule Take by mouth daily.    . pravastatin (PRAVACHOL) 10 MG tablet Take 1 tablet (10 mg total) by mouth daily. 90 tablet 3  . TRULICITY 1.5 PY/1.9JK SOPN INJECT INTO THE SKIN WEEKLY. 2 mL 1  . zolpidem (AMBIEN) 10 MG tablet Take 10 mg by mouth  at bedtime as needed for sleep.     No current facility-administered medications on file prior to visit.   Allergies  Allergen Reactions  . Sulfa Antibiotics     FH: - Diabetes in mother, sister, brother - Hypertension in mother and sister - Hyperlipidemia in brother  PE: BP 120/82 (BP Location: Right Arm, Patient Position: Sitting, Cuff Size: Normal)   Pulse 78   Ht _0  (1.575 m)   Wt 156 lb 6.4 oz (70.9 kg)   SpO2 99%   BMI 28.61 kg/m  Wt Readings from Last 3 Encounters:  10/17/20 156 lb 6.4 oz (70.9 kg)  10/01/20 156 lb 3.2 oz (70.9 kg)  06/13/20 155 lb 12.8 oz (70.7 kg)   Constitutional: normal weight, in NAD Eyes: PERRLA, EOMI, no exophthalmos ENT: moist mucous membranes, no thyromegaly, no cervical lymphadenopathy Cardiovascular: RRR, No MRG Respiratory: CTA  B Gastrointestinal: abdomen soft, NT, ND, BS+ Musculoskeletal: no deformities, strength intact in all 4 Skin: moist, warm, no rashes Neurological: no tremor with outstretched hands, DTR normal in all 4  ASSESSMENT: 1. DM2, non-insulin-dependent, uncontrolled, with complications - microalbuminuria  2. HL  3. MAU  PLAN:  1. Patient with longstanding, previously uncontrolled type 2 diabetes with improvement in control after adding Trulicity to her oral regimen of Metformin and sulfonylurea.  In the past, she was forgetting Trulicity doses only discussed about setting alarms on her phone to remember to take it.  At last visit, sugars were still slightly higher in the morning and they were usually at goal later in the day.  Upon questioning, she was taking Amaryl at bedtime, not before dinner.  We moved the sulfonylurea 15 to 30 minutes before dinner.  We also discussed about taking between 2 and 4 mg before dinner depending on the size of the meal.  She wanted to keep the Trulicity at the same dose at that time.  HbA1c at that time was slightly higher, at 7.1%. -At today's visit, she is not checking sugars at  all.  We discussed additional dangerous practice.  I strongly advised him to start.  She is inquiring about the Dexcom CGM, which her sister since.  I explained that this is only covered by Medicare if she is on insulin.  However, there might be exceptions to this role.  I advised her to call her insurance to see which CGM's are covered, if any, for her. - we checked her HbA1c: 7.1% (stable) -Therefore, I will not change her regimen at this visit, but at next visit, we may need to increase the Trulicity dose depending on her sugars. - I suggested to:  Patient Instructions  Please continue: - Metformin ER 500 mg 2x a day - Amaryl 4 mg in am and 2-4 mg before dinner - Trulicity 1.5 mg weekly   Please return in 4 months with your sugar log.   - advised to check sugars at different times of the day - 1-4x a day, rotating check times - advised for yearly eye exams >> she is UTD - return to clinic in 4 months  2. HL -Reviewed latest lipid panel from 01/2020: 120/155/32/61-LDL at goal, triglycerides slightly high, HDL slightly low -Continues on pravastatin 10 mg daily without side effects  3. MAU -She has a history of microalbuminuria with the latest level being slightly elevated, at 39.5. -We will continue olmesartan 20 mg daily  Philemon Kingdom, MD PhD [facility] Endocrinology

## 2020-10-17 NOTE — Patient Instructions (Addendum)
Please continue: - Metformin ER 500 mg 2x a day - Amaryl 4 mg in am and 2-4 mg before dinner - Trulicity 1.5 mg weekly  Check with your insurance if they cover a Freestyle Libre CGM or a Dexcom CGM.   Please return in 3-4 months with your sugar log.

## 2020-10-23 ENCOUNTER — Inpatient Hospital Stay: Payer: Medicare PPO

## 2020-10-23 ENCOUNTER — Other Ambulatory Visit: Payer: Self-pay

## 2020-10-23 ENCOUNTER — Inpatient Hospital Stay: Payer: Medicare PPO | Admitting: Internal Medicine

## 2020-10-23 ENCOUNTER — Encounter: Payer: Self-pay | Admitting: Internal Medicine

## 2020-10-23 VITALS — BP 165/74 | HR 80 | Temp 96.8°F | Resp 18 | Wt 156.8 lb

## 2020-10-23 DIAGNOSIS — Z5111 Encounter for antineoplastic chemotherapy: Secondary | ICD-10-CM | POA: Diagnosis not present

## 2020-10-23 DIAGNOSIS — Z7984 Long term (current) use of oral hypoglycemic drugs: Secondary | ICD-10-CM | POA: Diagnosis not present

## 2020-10-23 DIAGNOSIS — I10 Essential (primary) hypertension: Secondary | ICD-10-CM

## 2020-10-23 DIAGNOSIS — D72829 Elevated white blood cell count, unspecified: Secondary | ICD-10-CM | POA: Diagnosis not present

## 2020-10-23 DIAGNOSIS — E611 Iron deficiency: Secondary | ICD-10-CM | POA: Diagnosis not present

## 2020-10-23 DIAGNOSIS — D473 Essential (hemorrhagic) thrombocythemia: Secondary | ICD-10-CM | POA: Diagnosis not present

## 2020-10-23 DIAGNOSIS — D75839 Thrombocytosis, unspecified: Secondary | ICD-10-CM | POA: Diagnosis not present

## 2020-10-23 DIAGNOSIS — E119 Type 2 diabetes mellitus without complications: Secondary | ICD-10-CM | POA: Diagnosis not present

## 2020-10-23 LAB — CBC WITH DIFFERENTIAL (CANCER CENTER ONLY)
Abs Immature Granulocytes: 0.04 10*3/uL (ref 0.00–0.07)
Basophils Absolute: 0.2 10*3/uL — ABNORMAL HIGH (ref 0.0–0.1)
Basophils Relative: 2 %
Eosinophils Absolute: 0.4 10*3/uL (ref 0.0–0.5)
Eosinophils Relative: 3 %
HCT: 44.6 % (ref 36.0–46.0)
Hemoglobin: 14.2 g/dL (ref 12.0–15.0)
Immature Granulocytes: 0 %
Lymphocytes Relative: 21 %
Lymphs Abs: 2.8 10*3/uL (ref 0.7–4.0)
MCH: 24.6 pg — ABNORMAL LOW (ref 26.0–34.0)
MCHC: 31.8 g/dL (ref 30.0–36.0)
MCV: 77.3 fL — ABNORMAL LOW (ref 80.0–100.0)
Monocytes Absolute: 0.6 10*3/uL (ref 0.1–1.0)
Monocytes Relative: 4 %
Neutro Abs: 9.7 10*3/uL — ABNORMAL HIGH (ref 1.7–7.7)
Neutrophils Relative %: 70 %
Platelet Count: 460 10*3/uL — ABNORMAL HIGH (ref 150–400)
RBC: 5.77 MIL/uL — ABNORMAL HIGH (ref 3.87–5.11)
RDW: 15.5 % (ref 11.5–15.5)
WBC Count: 13.7 10*3/uL — ABNORMAL HIGH (ref 4.0–10.5)
nRBC: 0 % (ref 0.0–0.2)

## 2020-10-23 MED ORDER — HYDROXYUREA 500 MG PO CAPS: 500.0000 mg | ORAL_CAPSULE | Freq: Every day | ORAL | 1 refills | Status: DC

## 2020-10-23 NOTE — Progress Notes (Signed)
[place] Telephone:(336) [phone]   Fax:(336) [phone]  OFFICE PROGRESS NOTE  [name], MD [address] [place] [address]  DIAGNOSIS: Essential thrombocythemia diagnosed in March 2022.  PRIOR THERAPY: None  CURRENT THERAPY: Hydroxyurea 500 mg p.o. daily.  Started October 23, 2020.  INTERVAL HISTORY: Ann [name] 75 y.o. female returns to the clinic today for follow-up visit accompanied by her husband.  The patient is feeling fine today with no concerning complaints.  She denied having any current chest pain, shortness of breath, cough or hemoptysis.  She denied having any fever or chills.  She has no nausea, vomiting, diarrhea or constipation.  She has no headache or visual changes.  She has no bleeding, bruises or ecchymosis.  She had several studies performed recently for evaluation of her thrombocytosis and leukocytosis and the patient is here today for evaluation and discussion of her lab results and treatment options.  MEDICAL HISTORY: Past Medical History:  Diagnosis Date  . Diabetes mellitus without complication ([place])   . Hypertension     ALLERGIES:  is allergic to hydrocodone and sulfa antibiotics.  MEDICATIONS:  Current Outpatient Medications  Medication Sig Dispense Refill  . allopurinol (ZYLOPRIM) 100 MG tablet Take 200 mg by mouth at bedtime.     Marland Kitchen amLODipine (NORVASC) 10 MG tablet Take 10 mg by mouth daily. Unknown dose    . atenolol (TENORMIN) 50 MG tablet Take 50 mg by mouth daily. Unknown dose    . Blood Glucose Monitoring Suppl (FREESTYLE LITE) DEVI Use to check blood sugar 2 times a day 1 each 0  . glimepiride (AMARYL) 4 MG tablet TAKE 1 TABLET BEFORE BREAKFAST AND 1/2 TABLET BEFORE DINNER 135 tablet 1  . glucose blood (FREESTYLE TEST STRIPS) test strip Use 2x a day 100 each 11  . metFORMIN (GLUCOPHAGE-XR) 500 MG 24 hr tablet TAKE 1 TABLET (500 MG TOTAL) BY MOUTH 2 (TWO) TIMES DAILY WITH A MEAL. 180 tablet 0   . montelukast (SINGULAIR) 10 MG tablet Take 10 mg by mouth at bedtime. Unknown dose    . olmesartan (BENICAR) 20 MG tablet Take 20 mg by mouth daily.  0  . omeprazole (PRILOSEC) 20 MG capsule Take by mouth daily.    . pravastatin (PRAVACHOL) 10 MG tablet Take 1 tablet (10 mg total) by mouth daily. 90 tablet 3  . TRULICITY 1.5 VX/7.9TJ SOPN INJECT INTO THE SKIN WEEKLY. 2 mL 1  . zolpidem (AMBIEN) 10 MG tablet Take 10 mg by mouth at bedtime as needed for sleep.     No current facility-administered medications for this visit.    SURGICAL HISTORY:  Past Surgical History:  Procedure Laterality Date  . ABDOMINAL HYSTERECTOMY    . knee surgery  04/2013    REVIEW OF SYSTEMS:  Constitutional: positive for fatigue Eyes: negative Ears, nose, mouth, throat, and face: negative Respiratory: negative Cardiovascular: negative Gastrointestinal: negative Genitourinary:negative Integument/breast: negative Hematologic/lymphatic: negative Musculoskeletal:negative Neurological: negative Behavioral/Psych: negative Endocrine: negative Allergic/Immunologic: negative   PHYSICAL EXAMINATION: General appearance: alert, cooperative and no distress Head: Normocephalic, without obvious abnormality, atraumatic Neck: no adenopathy, no JVD, supple, symmetrical, trachea midline and thyroid not enlarged, symmetric, no tenderness/mass/nodules Lymph nodes: Cervical, supraclavicular, and axillary nodes normal. Resp: clear to auscultation bilaterally Back: symmetric, no curvature. ROM normal. No CVA tenderness. Cardio: regular rate and rhythm, S1, S2 normal, no murmur, click, rub or gallop GI: soft, non-tender; bowel sounds normal; no masses,  no organomegaly Extremities:  extremities normal, atraumatic, no cyanosis or edema Neurologic: Alert and oriented X 3, normal strength and tone. Normal symmetric reflexes. Normal coordination and gait  ECOG PERFORMANCE STATUS: 1 - Symptomatic but completely  ambulatory  Blood pressure (!) 165/74, pulse 80, temperature (!) 96.8 F (36 C), temperature source Tympanic, resp. rate 18, weight 156 lb 12.8 oz (71.1 kg), SpO2 99 %.  LABORATORY DATA: Lab Results  Component Value Date   WBC 13.7 (H) 10/23/2020   HGB 14.2 10/23/2020   HCT 44.6 10/23/2020   MCV 77.3 (L) 10/23/2020   PLT 460 (H) 10/23/2020      Chemistry      Component Value Date/Time   NA 136 10/01/2020 1122   K 3.5 10/01/2020 1122   CL 101 10/01/2020 1122   CO2 26 10/01/2020 1122   BUN 16 10/01/2020 1122   CREATININE 1.11 (H) 10/01/2020 1122      Component Value Date/Time   CALCIUM 9.3 10/01/2020 1122   ALKPHOS 98 10/01/2020 1122   AST 35 10/01/2020 1122   ALT 34 10/01/2020 1122   BILITOT 1.0 10/01/2020 1122       RADIOGRAPHIC STUDIES: No results found.  ASSESSMENT AND PLAN: This is a very pleasant 75 years old African-American female diagnosed with essential thrombocythemia in March 2022 with positive JAK2 mutation. I had a lengthy discussion with the patient and her husband today about her current condition and treatment options.  The patient and her husband had a lot of question about her condition and her treatment as well as the adverse effects.  I answered all these question to their satisfaction. I recommended for the patient to start treatment with hydroxyurea 500 mg p.o. daily and will adjust the dose as needed depending on the blood work in the future. She will come back for follow-up visit in 3 weeks for evaluation and repeat CBC, comprehensive metabolic panel and LDH. The patient was advised to call immediately if she has any concerning symptoms in the interval. The patient voices understanding of current disease status and treatment options and is in agreement with the current care plan.  All questions were answered. The patient knows to call the clinic with any problems, questions or concerns. We can certainly see the patient much sooner if  necessary.  The total time spent in the appointment was 30 minutes.  Disclaimer: This note was dictated with voice recognition software. Similar sounding words can inadvertently be transcribed and may not be corrected upon review.

## 2020-10-23 NOTE — Patient Instructions (Signed)
Hydroxyurea capsules What is this medicine? HYDROXYUREA (hye drox ee yoor EE a) is a chemotherapy drug. This medicine is used to treat certain types of leukemias and head and neck cancer. It is also used to control the painful crises of sickle cell anemia. This medicine may be used for other purposes; ask your health care provider or pharmacist if you have questions. COMMON BRAND NAME(S): Droxia, Hydrea What should I tell my health care provider before I take this medicine? They need to know if you have any of these conditions:  gout or high levels of uric acid in the blood  HIV or AIDS  kidney disease or on hemodialysis  leg wounds or ulcers  low blood counts, like low white cell, platelet, or red cell counts  prior or current interferon therapy  recent or ongoing radiation therapy  scheduled to receive a vaccine  an unusual or allergic reaction to hydroxyurea, other medicines, foods, dyes, or preservatives  pregnant or trying to get pregnant  breast-feeding How should I use this medicine? Take this medicine by mouth with a glass of water. Follow the directions on the prescription label. Take your medicine at regular intervals. Do not take it more often than directed. Do not stop taking except on your doctor's advice. People who are not taking this medicine should not be exposed to it. Wash your hands before and after handling your bottle or medicine. Caregivers should wear disposable gloves if they must touch the bottle or medicine. Clean up any medicine powder that spills with a damp disposable towel and throw the towel away in a closed container, such as a plastic bag. A special MedGuide will be given to you by the pharmacist with each prescription and refill. Be sure to read this information carefully each time. Talk to your pediatrician regarding the use of this medicine in children. Special care may be needed. Overdosage: If you think you have taken too much of this medicine  contact a poison control center or emergency room at once. NOTE: This medicine is only for you. Do not share this medicine with others. What if I miss a dose? If you miss a dose, take it as soon as you can. If it is almost time for your next dose, take only that dose. Do not take double or extra doses. What may interact with this medicine? This medicine may also interact with the following medications:  didanosine  stavudine  live virus vaccines This list may not describe all possible interactions. Give your health care provider a list of all the medicines, herbs, non-prescription drugs, or dietary supplements you use. Also tell them if you smoke, drink alcohol, or use illegal drugs. Some items may interact with your medicine. What should I watch for while using this medicine? This drug may make you feel generally unwell. This is not uncommon, as chemotherapy can affect healthy cells as well as cancer cells. Report any side effects. Continue your course of treatment even though you feel ill unless your doctor tells you to stop. You will receive regular blood tests during your treatment. Call your doctor or health care professional for advice if you get a fever, chills or sore throat, or other symptoms of a cold or flu. Do not treat yourself. This drug decreases your body's ability to fight infections. Try to avoid being around people who are sick. This medicine may increase your risk to bruise or bleed. Call your doctor or health care professional if you notice any unusual  bleeding. Talk to your doctor about your risk of cancer. You may be more at risk for certain types of cancers if you take this medicine. Keep out of the sun. If you cannot avoid being in the sun, wear protective clothing and use sunscreen. Do not use sun lamps or tanning beds/booths. Do not become pregnant while taking this medicine or for at least 6 months after stopping it. Women should inform their doctor if they wish to  become pregnant or think they might be pregnant. Men should not father a child while taking this medicine and for at least a year after stopping it. There is a potential for serious side effects to an unborn child. Talk to your health care professional or pharmacist for more information. Do not breast-feed an infant while taking this medicine. This may interfere with the ability to have or father a child. You should talk with your doctor or health care professional if you are concerned about your fertility. What side effects may I notice from receiving this medicine? Side effects that you should report to your doctor or health care professional as soon as possible:  allergic reactions like skin rash, itching or hives, swelling of the face, lips, or tongue  breathing problems  burning, redness or pain at the site of any radiation therapy  low blood counts - this medicine may decrease the number of white blood cells, red blood cells and platelets. You may be at increased risk for infections and bleeding.  signs of decreased platelets or bleeding - bruising, pinpoint red spots on the skin, black, tarry stools, blood in the urine  signs of decreased red blood cells - unusually weak or tired, fainting spells, lightheadedness  signs of infection - fever or chills, cough, sore throat, pain or difficulty passing urine  signs and symptoms of bleeding such as bloody or black, tarry stools; red or dark-brown urine; spitting up blood or brown material that looks like coffee grounds; red spots on the skin; unusual bruising or bleeding from the eye, gums, or nose  skin ulcers Side effects that usually do not require medical attention (report to your doctor or health care professional if they continue or are bothersome):  constipation  diarrhea  loss of appetite  mouth sores  nausea This list may not describe all possible side effects. Call your doctor for medical advice about side effects. You may  report side effects to FDA at 1-800-FDA-1088. Where should I keep my medicine? Keep out of the reach of children. See product for storage instructions. Each product may have different instructions. Keep tightly closed. Throw away any unused medicine after the expiration date. NOTE: This sheet is a summary. It may not cover all possible information. If you have questions about this medicine, talk to your doctor, pharmacist, or health care provider.  2021 Elsevier/Gold Standard (2017-11-19 16:07:26) Essential Thrombocythemia  Essential thrombocythemia is a condition in which a person has too many platelets (thrombocytes) in the blood. Platelets are parts of blood that stick together and form a clot (thrombus) to help the body stop bleeding after an injury. This condition may also be called primary or essential thrombocytosis. Essential thrombocythemia happens when abnormal cells in the bone marrow (megakaryocytes) make too many platelets. What are the causes? The cause of this condition is not known. What are the signs or symptoms? This condition may not cause any symptoms. If you have symptoms, they may include:  Weakness.  Headache.  Itching.  Sweating.  Fever.  Dizziness or confusion.  Tingling or burning in your hands or feet.  Blood clots.  Bleeding.  Enlarged spleen. How is this diagnosed? This condition may be diagnosed based on:  A physical exam.  Your symptoms.  Your medical history.  Blood tests.  A procedure to collect a sample of your bone marrow (bone marrow aspiration) for testing. How is this treated? If you do not have symptoms, you may not need treatment. Your health care provider may monitor your condition with regular blood tests. If you have symptoms, or if your platelet count is very high, you may be treated with:  Aspirin or other medicines to thin the blood and prevent blood clots.  Medicines to reduce the number of platelets in your  blood.  A procedure to remove some platelets from your blood (plateletpheresis). During this procedure: ? Your health care provider will place an IV into one of your veins. ? The IV will be used to draw blood into a machine that separates out the extra platelets. ? The blood with reduced platelets will be returned to your body. Follow these instructions at home:  Take over-the-counter and prescription medicines only as told by your health care provider.  If you are taking blood thinners: ? Talk with your health care provider before you take any medicines that contain aspirin or NSAIDs. These medicines increase your risk for dangerous bleeding. ? Take your medicine exactly as told, at the same time every day. ? Avoid activities that could cause injury or bruising, and follow instructions about how to prevent falls. ? Wear a medical alert bracelet or carry a card that lists what medicines you take.  Tell all health care providers, including dentists, about any medicines you are taking to prevent blood clots.  Do not use any products that contain nicotine or tobacco, such as cigarettes and e-cigarettes. If you need help quitting, ask your health care provider.  Ask your health care provider about managing or preventing high cholesterol, high blood pressure, and diabetes. These conditions can make essential thrombocythemia worse.  Keep all follow-up visits as told by your health care provider. This is important. Contact a health care provider if:  You have severe pain, and medicines do not help.  You have problems taking your medicines to prevent blood clots.  You faint. Get help right away if:  You have bleeding or blood clots.  You have unusual bruises.  You have bloody or tarry stools.  You have pink or bloody urine.  Your menstrual periods are heavier than normal, if applicable.  You have nosebleeds and bleeding gums.  You have chest pain.  You have trouble  breathing.  You have any symptoms of a stroke. "BE FAST" is an easy way to remember the main warning signs of a stroke: ? B - Balance. Signs are dizziness, sudden trouble walking, or loss of balance. ? E - Eyes. Signs are trouble seeing or a sudden change in vision. ? F - Face. Signs are sudden weakness or numbness of the face, or the face or eyelid drooping on one side. ? A - Arm. Signs are weakness or numbness in an arm. This happens suddenly and usually on one side of the body. ? S - Speech. Signs are sudden trouble speaking, slurred speech, or trouble understanding what people say. ? T - Time. Time to call emergency services. Write down what time symptoms started.  You have other signs of a stroke, such as: ? A sudden, severe headache  with no known cause. ? Nausea or vomiting. ? Seizure. These symptoms may represent a serious problem that is an emergency. Do not wait to see if the symptoms will go away. Get medical help right away. Call your local emergency services (911 in the [place] Do not drive yourself to the hospital.   Summary  Essential thrombocythemia happens when abnormal cells in the bone marrow make too many platelets.  If you have symptoms, or if your platelet count is very high, you may need treatment.  Treatment can vary and may include medicines to thin the blood and prevent blood clots.  Ask your health care provider about how to manage or prevent high cholesterol, high blood pressure, and diabetes. These conditions can make essential thrombocythemia worse.  Get help right away if you have any symptoms of stroke. This information is not intended to replace advice given to you by your health care provider. Make sure you discuss any questions you have with your health care provider. Document Revised: 12/27/2019 Document Reviewed: 12/27/2019 Elsevier Patient Education  2021 [employer].

## 2020-10-24 ENCOUNTER — Telehealth: Payer: Self-pay | Admitting: Internal Medicine

## 2020-10-24 NOTE — Telephone Encounter (Signed)
Scheduled per los. Called and spoke with patient. Confirmed appt 

## 2020-11-08 ENCOUNTER — Telehealth: Payer: Self-pay | Admitting: Internal Medicine

## 2020-11-08 NOTE — Telephone Encounter (Signed)
R/s 4/21 appt due to provider schedule adjustment. Called and spoke with patient. Confirmed appt

## 2020-11-14 ENCOUNTER — Inpatient Hospital Stay: Payer: Medicare PPO

## 2020-11-14 ENCOUNTER — Inpatient Hospital Stay: Payer: Medicare PPO | Admitting: Internal Medicine

## 2020-11-15 ENCOUNTER — Other Ambulatory Visit: Payer: Self-pay

## 2020-11-15 ENCOUNTER — Encounter: Payer: Self-pay | Admitting: Internal Medicine

## 2020-11-15 ENCOUNTER — Inpatient Hospital Stay: Payer: Medicare PPO | Admitting: Internal Medicine

## 2020-11-15 ENCOUNTER — Inpatient Hospital Stay: Payer: Medicare PPO | Attending: Internal Medicine

## 2020-11-15 VITALS — BP 144/65 | HR 73 | Temp 97.9°F | Resp 19 | Ht 62.0 in | Wt 155.9 lb

## 2020-11-15 DIAGNOSIS — Z79899 Other long term (current) drug therapy: Secondary | ICD-10-CM | POA: Insufficient documentation

## 2020-11-15 DIAGNOSIS — D473 Essential (hemorrhagic) thrombocythemia: Secondary | ICD-10-CM

## 2020-11-15 DIAGNOSIS — Z5111 Encounter for antineoplastic chemotherapy: Secondary | ICD-10-CM

## 2020-11-15 LAB — CBC WITH DIFFERENTIAL (CANCER CENTER ONLY)
Abs Immature Granulocytes: 0.03 10*3/uL (ref 0.00–0.07)
Basophils Absolute: 0.2 10*3/uL — ABNORMAL HIGH (ref 0.0–0.1)
Basophils Relative: 2 %
Eosinophils Absolute: 0.2 10*3/uL (ref 0.0–0.5)
Eosinophils Relative: 2 %
HCT: 43.8 % (ref 36.0–46.0)
Hemoglobin: 13.9 g/dL (ref 12.0–15.0)
Immature Granulocytes: 0 %
Lymphocytes Relative: 25 %
Lymphs Abs: 2.3 10*3/uL (ref 0.7–4.0)
MCH: 24.7 pg — ABNORMAL LOW (ref 26.0–34.0)
MCHC: 31.7 g/dL (ref 30.0–36.0)
MCV: 77.8 fL — ABNORMAL LOW (ref 80.0–100.0)
Monocytes Absolute: 0.4 10*3/uL (ref 0.1–1.0)
Monocytes Relative: 5 %
Neutro Abs: 6 10*3/uL (ref 1.7–7.7)
Neutrophils Relative %: 66 %
Platelet Count: 318 10*3/uL (ref 150–400)
RBC: 5.63 MIL/uL — ABNORMAL HIGH (ref 3.87–5.11)
RDW: 18.2 % — ABNORMAL HIGH (ref 11.5–15.5)
WBC Count: 9.1 10*3/uL (ref 4.0–10.5)
nRBC: 0 % (ref 0.0–0.2)

## 2020-11-15 LAB — CMP (CANCER CENTER ONLY)
ALT: 34 U/L (ref 0–44)
AST: 35 U/L (ref 15–41)
Albumin: 3.9 g/dL (ref 3.5–5.0)
Alkaline Phosphatase: 84 U/L (ref 38–126)
Anion gap: 13 (ref 5–15)
BUN: 12 mg/dL (ref 8–23)
CO2: 24 mmol/L (ref 22–32)
Calcium: 8.9 mg/dL (ref 8.9–10.3)
Chloride: 103 mmol/L (ref 98–111)
Creatinine: 0.92 mg/dL (ref 0.44–1.00)
GFR, Estimated: >60 mL/min (ref >60)
Glucose, Bld: 146 mg/dL — ABNORMAL HIGH (ref 70–99)
Potassium: 3.3 mmol/L — ABNORMAL LOW (ref 3.5–5.1)
Sodium: 140 mmol/L (ref 135–145)
Total Bilirubin: 0.8 mg/dL (ref 0.3–1.2)
Total Protein: 8.3 g/dL — ABNORMAL HIGH (ref 6.5–8.1)

## 2020-11-15 LAB — LACTATE DEHYDROGENASE: LDH: 152 U/L (ref 98–192)

## 2020-11-15 NOTE — Progress Notes (Signed)
[facility] [phone]   Fax:([phone]  OFFICE PROGRESS NOTE  [name], MD [address] [place] [address]  DIAGNOSIS: Essential thrombocythemia diagnosed in March 2022.  PRIOR THERAPY: None  CURRENT THERAPY: Hydroxyurea 500 mg p.o. daily.  Started October 23, 2020.  INTERVAL HISTORY: Ann [name] 75 y.o. female returns to the clinic today for follow-up visit accompanied by her husband.  The patient is feeling fine today with no concerning complaints except for fatigue.  She denied having any chest pain, shortness of breath, cough or hemoptysis.  She denied having any fever or chills.  She has no nausea, vomiting, diarrhea or constipation.  She has no headache or visual changes.  She is here today for evaluation and repeat blood work.  She continues to tolerate her treatment with hydroxyurea fairly well except for the fatigue.  MEDICAL HISTORY: Past Medical History:  Diagnosis Date  . Diabetes mellitus without complication ([place])   . Hypertension     ALLERGIES:  is allergic to hydrocodone and sulfa antibiotics.  MEDICATIONS:  Current Outpatient Medications  Medication Sig Dispense Refill  . allopurinol (ZYLOPRIM) 100 MG tablet Take 200 mg by mouth at bedtime.     Marland Kitchen amLODipine (NORVASC) 10 MG tablet Take 10 mg by mouth daily. Unknown dose    . atenolol (TENORMIN) 50 MG tablet Take 50 mg by mouth daily. Unknown dose    . Blood Glucose Monitoring Suppl (FREESTYLE LITE) DEVI Use to check blood sugar 2 times a day 1 each 0  . clonazePAM (KLONOPIN) 0.5 MG tablet Take 0.5 mg by mouth daily as needed.    Marland Kitchen COVID-19 mRNA vaccine, Moderna, 100 MCG/0.5ML injection USE AS DIRECTED .25 mL 0  . estradiol (VIVELLE-DOT) 0.075 MG/24HR Place 1 patch onto the skin 2 (two) times a week.    . fluticasone (FLONASE) 50 MCG/ACT nasal spray     . glimepiride (AMARYL) 4 MG tablet TAKE 1 TABLET BEFORE BREAKFAST AND 1/2 TABLET BEFORE  DINNER 135 tablet 1  . glucose blood (FREESTYLE TEST STRIPS) test strip Use 2x a day 100 each 11  . hydroxyurea (HYDREA) 500 MG capsule Take 1 capsule (500 mg total) by mouth daily. May take with food to minimize GI side effects. 90 capsule 1  . hydrOXYzine (ATARAX/VISTARIL) 50 MG tablet 1/2 to 1 tablet as needed for anxiety    . metFORMIN (GLUCOPHAGE-XR) 500 MG 24 hr tablet TAKE 1 TABLET (500 MG TOTAL) BY MOUTH 2 (TWO) TIMES DAILY WITH A MEAL. 180 tablet 0  . montelukast (SINGULAIR) 10 MG tablet Take 10 mg by mouth at bedtime. Unknown dose    . olmesartan (BENICAR) 20 MG tablet Take 20 mg by mouth daily.  0  . omeprazole (PRILOSEC) 20 MG capsule Take by mouth daily.    . pantoprazole (PROTONIX) 20 MG tablet Take 20 mg by mouth daily.    . pravastatin (PRAVACHOL) 10 MG tablet Take 1 tablet (10 mg total) by mouth daily. 90 tablet 3  . TRULICITY 1.5 FO/2.7XA SOPN INJECT INTO THE SKIN WEEKLY. 2 mL 1   No current facility-administered medications for this visit.    SURGICAL HISTORY:  Past Surgical History:  Procedure Laterality Date  . ABDOMINAL HYSTERECTOMY    . knee surgery  04/2013    REVIEW OF SYSTEMS:  A comprehensive review of systems was negative except for: Constitutional: positive for fatigue   PHYSICAL EXAMINATION: General appearance: alert, cooperative, fatigued and  no distress Head: Normocephalic, without obvious abnormality, atraumatic Neck: no adenopathy, no JVD, supple, symmetrical, trachea midline and thyroid not enlarged, symmetric, no tenderness/mass/nodules Lymph nodes: Cervical, supraclavicular, and axillary nodes normal. Resp: clear to auscultation bilaterally Back: symmetric, no curvature. ROM normal. No CVA tenderness. Cardio: regular rate and rhythm, S1, S2 normal, no murmur, click, rub or gallop GI: soft, non-tender; bowel sounds normal; no masses,  no organomegaly Extremities: extremities normal, atraumatic, no cyanosis or edema  ECOG PERFORMANCE STATUS: 1 -  Symptomatic but completely ambulatory  Blood pressure (!) 144/65, pulse 73, temperature 97.9 F (36.6 C), temperature source Tympanic, resp. rate 19, height 5\' 2"  (1.575 m), weight 155 lb 14.4 oz (70.7 kg), SpO2 100 %.  LABORATORY DATA: Lab Results  Component Value Date   WBC 9.1 11/15/2020   HGB 13.9 11/15/2020   HCT 43.8 11/15/2020   MCV 77.8 (L) 11/15/2020   PLT 318 11/15/2020      Chemistry      Component Value Date/Time   NA 136 10/01/2020 1122   K 3.5 10/01/2020 1122   CL 101 10/01/2020 1122   CO2 26 10/01/2020 1122   BUN 16 10/01/2020 1122   CREATININE 1.11 (H) 10/01/2020 1122      Component Value Date/Time   CALCIUM 9.3 10/01/2020 1122   ALKPHOS 98 10/01/2020 1122   AST 35 10/01/2020 1122   ALT 34 10/01/2020 1122   BILITOT 1.0 10/01/2020 1122       RADIOGRAPHIC STUDIES: No results found.  ASSESSMENT AND PLAN: This is a very pleasant 75 years old African-American female diagnosed with essential thrombocythemia in March 2022 with positive JAK2 mutation. The patient is currently undergoing treatment with hydroxyurea 500 mg p.o. daily and tolerating it well except for fatigue. CBC today showed improvement of her platelets count as well as the white blood count. I recommended for the patient to continue her current treatment with hydroxyurea with the same dose. I will see her back for follow-up visit in 6 weeks for evaluation and repeat blood work. She was advised to call immediately if she has any concerning symptoms in the interval. The patient voices understanding of current disease status and treatment options and is in agreement with the current care plan.  All questions were answered. The patient knows to call the clinic with any problems, questions or concerns. We can certainly see the patient much sooner if necessary.  Disclaimer: This note was dictated with voice recognition software. Similar sounding words can inadvertently be transcribed and may not be  corrected upon review.

## 2020-11-16 ENCOUNTER — Other Ambulatory Visit: Payer: Self-pay | Admitting: Internal Medicine

## 2020-11-19 DIAGNOSIS — E1129 Type 2 diabetes mellitus with other diabetic kidney complication: Secondary | ICD-10-CM | POA: Diagnosis not present

## 2020-11-27 ENCOUNTER — Ambulatory Visit: Payer: Medicare PPO

## 2020-12-20 ENCOUNTER — Other Ambulatory Visit: Payer: Self-pay | Admitting: Internal Medicine

## 2020-12-26 ENCOUNTER — Inpatient Hospital Stay (HOSPITAL_BASED_OUTPATIENT_CLINIC_OR_DEPARTMENT_OTHER): Payer: Medicare PPO | Admitting: Internal Medicine

## 2020-12-26 ENCOUNTER — Inpatient Hospital Stay: Payer: Medicare PPO | Attending: Internal Medicine

## 2020-12-26 ENCOUNTER — Other Ambulatory Visit: Payer: Self-pay

## 2020-12-26 VITALS — BP 146/72 | HR 82 | Temp 97.3°F | Resp 19 | Ht 62.0 in | Wt 151.5 lb

## 2020-12-26 DIAGNOSIS — Z79899 Other long term (current) drug therapy: Secondary | ICD-10-CM | POA: Insufficient documentation

## 2020-12-26 DIAGNOSIS — E119 Type 2 diabetes mellitus without complications: Secondary | ICD-10-CM | POA: Diagnosis not present

## 2020-12-26 DIAGNOSIS — I10 Essential (primary) hypertension: Secondary | ICD-10-CM | POA: Diagnosis not present

## 2020-12-26 DIAGNOSIS — D649 Anemia, unspecified: Secondary | ICD-10-CM | POA: Diagnosis not present

## 2020-12-26 DIAGNOSIS — D473 Essential (hemorrhagic) thrombocythemia: Secondary | ICD-10-CM

## 2020-12-26 DIAGNOSIS — Z7984 Long term (current) use of oral hypoglycemic drugs: Secondary | ICD-10-CM | POA: Diagnosis not present

## 2020-12-26 DIAGNOSIS — Z5111 Encounter for antineoplastic chemotherapy: Secondary | ICD-10-CM

## 2020-12-26 LAB — CMP (CANCER CENTER ONLY)
ALT: 28 U/L (ref 0–44)
AST: 29 U/L (ref 15–41)
Albumin: 3.8 g/dL (ref 3.5–5.0)
Alkaline Phosphatase: 81 U/L (ref 38–126)
Anion gap: 14 (ref 5–15)
BUN: 13 mg/dL (ref 8–23)
CO2: 23 mmol/L (ref 22–32)
Calcium: 9.7 mg/dL (ref 8.9–10.3)
Chloride: 103 mmol/L (ref 98–111)
Creatinine: 1.07 mg/dL — ABNORMAL HIGH (ref 0.44–1.00)
GFR, Estimated: 55 mL/min — ABNORMAL LOW (ref >60)
Glucose, Bld: 166 mg/dL — ABNORMAL HIGH (ref 70–99)
Potassium: 3.4 mmol/L — ABNORMAL LOW (ref 3.5–5.1)
Sodium: 140 mmol/L (ref 135–145)
Total Bilirubin: 1.1 mg/dL (ref 0.3–1.2)
Total Protein: 8.7 g/dL — ABNORMAL HIGH (ref 6.5–8.1)

## 2020-12-26 LAB — CBC WITH DIFFERENTIAL (CANCER CENTER ONLY)
Abs Immature Granulocytes: 0.03 10*3/uL (ref 0.00–0.07)
Basophils Absolute: 0.1 10*3/uL (ref 0.0–0.1)
Basophils Relative: 1 %
Eosinophils Absolute: 0.2 10*3/uL (ref 0.0–0.5)
Eosinophils Relative: 2 %
HCT: 36.9 % (ref 36.0–46.0)
Hemoglobin: 11.7 g/dL — ABNORMAL LOW (ref 12.0–15.0)
Immature Granulocytes: 0 %
Lymphocytes Relative: 26 %
Lymphs Abs: 2.6 10*3/uL (ref 0.7–4.0)
MCH: 25.5 pg — ABNORMAL LOW (ref 26.0–34.0)
MCHC: 31.7 g/dL (ref 30.0–36.0)
MCV: 80.4 fL (ref 80.0–100.0)
Monocytes Absolute: 0.5 10*3/uL (ref 0.1–1.0)
Monocytes Relative: 5 %
Neutro Abs: 6.5 10*3/uL (ref 1.7–7.7)
Neutrophils Relative %: 66 %
Platelet Count: 393 10*3/uL (ref 150–400)
RBC: 4.59 MIL/uL (ref 3.87–5.11)
RDW: 21.2 % — ABNORMAL HIGH (ref 11.5–15.5)
WBC Count: 9.8 10*3/uL (ref 4.0–10.5)
nRBC: 0 % (ref 0.0–0.2)

## 2020-12-26 LAB — LACTATE DEHYDROGENASE: LDH: 141 U/L (ref 98–192)

## 2020-12-26 NOTE — Progress Notes (Signed)
[facility] [phone]   Fax:([phone]) [phone]  OFFICE PROGRESS NOTE  [name], MD [address] [place] [address]  DIAGNOSIS: Essential thrombocythemia diagnosed in March 2022.  PRIOR THERAPY: None  CURRENT THERAPY: Hydroxyurea 500 mg p.o. daily.  Started October 23, 2020.  INTERVAL HISTORY: Ann [name] 75 y.o. female returns to the clinic today for follow-up visit accompanied by her husband Ann [name].  The patient is feeling fine today with no concerning complaints.  She is tolerating her treatment with hydroxyurea fairly well.  She denied having any current fatigue or weakness.  She has no nausea, vomiting, diarrhea or constipation.  She has no chest pain, shortness of breath, cough or hemoptysis.  She has no headache or visual changes.  She is here today for evaluation and repeat blood work.   MEDICAL HISTORY: Past Medical History:  Diagnosis Date  . Diabetes mellitus without complication ([place])   . Hypertension     ALLERGIES:  is allergic to hydrocodone and sulfa antibiotics.  MEDICATIONS:  Current Outpatient Medications  Medication Sig Dispense Refill  . allopurinol (ZYLOPRIM) 100 MG tablet Take 200 mg by mouth at bedtime.     Marland Kitchen amLODipine (NORVASC) 10 MG tablet Take 10 mg by mouth daily. Unknown dose    . atenolol (TENORMIN) 50 MG tablet Take 50 mg by mouth daily. Unknown dose    . Blood Glucose Monitoring Suppl (FREESTYLE LITE) DEVI Use to check blood sugar 2 times a day 1 each 0  . clonazePAM (KLONOPIN) 0.5 MG tablet Take 0.5 mg by mouth daily as needed.    Marland Kitchen estradiol (VIVELLE-DOT) 0.075 MG/24HR Place 1 patch onto the skin 2 (two) times a week.    . fluticasone (FLONASE) 50 MCG/ACT nasal spray     . glimepiride (AMARYL) 4 MG tablet TAKE 1 TABLET BEFORE BREAKFAST AND 1/2 TABLET BEFORE DINNER 135 tablet 1  . glucose blood (FREESTYLE TEST STRIPS) test strip Use 2x a day 100 each 11  . hydroxyurea (HYDREA) 500 MG  capsule Take 1 capsule (500 mg total) by mouth daily. May take with food to minimize GI side effects. 90 capsule 1  . metFORMIN (GLUCOPHAGE-XR) 500 MG 24 hr tablet TAKE 1 TABLET BY MOUTH 2 TIMES DAILY WITH A MEAL. 180 tablet 0  . montelukast (SINGULAIR) 10 MG tablet Take 10 mg by mouth at bedtime. Unknown dose    . olmesartan (BENICAR) 20 MG tablet Take 20 mg by mouth daily.  0  . pantoprazole (PROTONIX) 20 MG tablet Take 20 mg by mouth daily.    . pravastatin (PRAVACHOL) 10 MG tablet Take 1 tablet (10 mg total) by mouth daily. 90 tablet 3  . TRULICITY 1.5 DT/2.6ZT SOPN INJECT INTO THE SKIN WEEKLY. 2 mL 1   No current facility-administered medications for this visit.    SURGICAL HISTORY:  Past Surgical History:  Procedure Laterality Date  . ABDOMINAL HYSTERECTOMY    . knee surgery  04/2013    REVIEW OF SYSTEMS:  A comprehensive review of systems was negative.   PHYSICAL EXAMINATION: General appearance: alert, cooperative and no distress Head: Normocephalic, without obvious abnormality, atraumatic Neck: no adenopathy, no JVD, supple, symmetrical, trachea midline and thyroid not enlarged, symmetric, no tenderness/mass/nodules Lymph nodes: Cervical, supraclavicular, and axillary nodes normal. Resp: clear to auscultation bilaterally Back: symmetric, no curvature. ROM normal. No CVA tenderness. Cardio: regular rate and rhythm, S1, S2 normal, no murmur, click, rub or gallop GI: soft,  non-tender; bowel sounds normal; no masses,  no organomegaly Extremities: extremities normal, atraumatic, no cyanosis or edema  ECOG PERFORMANCE STATUS: 1 - Symptomatic but completely ambulatory  Blood pressure (!) 146/72, pulse 82, temperature (!) 97.3 F (36.3 C), temperature source Tympanic, resp. rate 19, height 5\' 2"  (1.575 m), weight 151 lb 8 oz (68.7 kg), SpO2 100 %.  LABORATORY DATA: Lab Results  Component Value Date   WBC 9.8 12/26/2020   HGB 11.7 (L) 12/26/2020   HCT 36.9 12/26/2020   MCV  80.4 12/26/2020   PLT 393 12/26/2020      Chemistry      Component Value Date/Time   NA 140 12/26/2020 0855   K 3.4 (L) 12/26/2020 0855   CL 103 12/26/2020 0855   CO2 23 12/26/2020 0855   BUN 13 12/26/2020 0855   CREATININE 1.07 (H) 12/26/2020 0855      Component Value Date/Time   CALCIUM 9.7 12/26/2020 0855   ALKPHOS 81 12/26/2020 0855   AST 29 12/26/2020 0855   ALT 28 12/26/2020 0855   BILITOT 1.1 12/26/2020 0855       RADIOGRAPHIC STUDIES: No results found.  ASSESSMENT AND PLAN: This is a very pleasant 75 years old African-American female diagnosed with essential thrombocythemia in March 2022 with positive JAK2 mutation. The patient is currently undergoing treatment with hydroxyurea 500 mg p.o. daily. The patient continues to tolerate her treatment with hydroxyurea fairly well with no concerning complaints. CBC today is unremarkable except for mild anemia. I recommended for the patient to continue on her current treatment with hydroxyurea 500 mg p.o. daily. I will see her back for follow-up visit in 6 weeks with repeat blood work. She was advised to call immediately if she has any concerning symptoms in the interval. The patient voices understanding of current disease status and treatment options and is in agreement with the current care plan.  All questions were answered. The patient knows to call the clinic with any problems, questions or concerns. We can certainly see the patient much sooner if necessary.  Disclaimer: This note was dictated with voice recognition software. Similar sounding words can inadvertently be transcribed and may not be corrected upon review.

## 2020-12-31 ENCOUNTER — Telehealth: Payer: Self-pay | Admitting: Internal Medicine

## 2020-12-31 NOTE — Telephone Encounter (Signed)
Scheduled per los. Called and spoke with patient. Confirmed appt 

## 2021-01-02 DIAGNOSIS — H35371 Puckering of macula, right eye: Secondary | ICD-10-CM | POA: Diagnosis not present

## 2021-01-02 DIAGNOSIS — H25813 Combined forms of age-related cataract, bilateral: Secondary | ICD-10-CM | POA: Diagnosis not present

## 2021-01-02 DIAGNOSIS — H3122 Choroidal dystrophy (central areolar) (generalized) (peripapillary): Secondary | ICD-10-CM | POA: Diagnosis not present

## 2021-01-02 DIAGNOSIS — H31092 Other chorioretinal scars, left eye: Secondary | ICD-10-CM | POA: Diagnosis not present

## 2021-01-08 ENCOUNTER — Ambulatory Visit: Payer: Medicare PPO | Attending: Internal Medicine

## 2021-01-08 DIAGNOSIS — Z23 Encounter for immunization: Secondary | ICD-10-CM

## 2021-01-08 NOTE — Progress Notes (Signed)
   Covid-19 Vaccination Clinic  Name:  Ann [name]    MRN: [id number] DOB: July 17, 1946  01/08/2021  [name] was observed post Covid-19 immunization for 15 minutes without incident. She was provided with Vaccine Information Sheet and instruction to access the V-Safe system.   [name] was instructed to call 911 with any severe reactions post vaccine: Difficulty breathing  Swelling of face and throat  A fast heartbeat  A bad rash all over body  Dizziness and weakness   Immunizations Administered     Name Date Dose VIS Date Route   Moderna Covid-19 Booster Vaccine 01/08/2021 11:36 AM 0.25 mL 05/15/2020 Intramuscular   Manufacturer: Levan Hurst   Lot: [id number]   [place]: [id number]      [name], PharmD, MBA Clinical Acute Care Pharmacist

## 2021-01-14 ENCOUNTER — Other Ambulatory Visit (HOSPITAL_COMMUNITY): Payer: Self-pay

## 2021-01-14 MED ORDER — COVID-19 MRNA VACC (MODERNA) 100 MCG/0.5ML IM SUSP
INTRAMUSCULAR | 0 refills | Status: DC
  Filled 2021-01-14: qty 0.5, 17d supply, fill #0

## 2021-01-17 ENCOUNTER — Other Ambulatory Visit (HOSPITAL_COMMUNITY): Payer: Self-pay

## 2021-02-05 ENCOUNTER — Telehealth: Payer: Self-pay

## 2021-02-05 NOTE — Telephone Encounter (Signed)
Papers were faxed to [facility] @ [phone] on 11/11/2020

## 2021-02-06 ENCOUNTER — Inpatient Hospital Stay: Payer: Medicare PPO | Attending: Internal Medicine | Admitting: Internal Medicine

## 2021-02-06 ENCOUNTER — Inpatient Hospital Stay: Payer: Medicare PPO

## 2021-02-06 ENCOUNTER — Encounter: Payer: Self-pay | Admitting: Internal Medicine

## 2021-02-06 ENCOUNTER — Other Ambulatory Visit: Payer: Self-pay

## 2021-02-06 VITALS — BP 145/69 | HR 75 | Temp 98.0°F | Resp 15 | Ht 62.0 in | Wt 151.2 lb

## 2021-02-06 DIAGNOSIS — D473 Essential (hemorrhagic) thrombocythemia: Secondary | ICD-10-CM | POA: Diagnosis not present

## 2021-02-06 DIAGNOSIS — Z0000 Encounter for general adult medical examination without abnormal findings: Secondary | ICD-10-CM | POA: Diagnosis not present

## 2021-02-06 DIAGNOSIS — Z5111 Encounter for antineoplastic chemotherapy: Secondary | ICD-10-CM

## 2021-02-06 DIAGNOSIS — M109 Gout, unspecified: Secondary | ICD-10-CM | POA: Diagnosis not present

## 2021-02-06 DIAGNOSIS — Z79899 Other long term (current) drug therapy: Secondary | ICD-10-CM | POA: Diagnosis not present

## 2021-02-06 DIAGNOSIS — L989 Disorder of the skin and subcutaneous tissue, unspecified: Secondary | ICD-10-CM | POA: Diagnosis not present

## 2021-02-06 DIAGNOSIS — F411 Generalized anxiety disorder: Secondary | ICD-10-CM | POA: Diagnosis not present

## 2021-02-06 DIAGNOSIS — E785 Hyperlipidemia, unspecified: Secondary | ICD-10-CM | POA: Diagnosis not present

## 2021-02-06 DIAGNOSIS — I10 Essential (primary) hypertension: Secondary | ICD-10-CM | POA: Diagnosis not present

## 2021-02-06 DIAGNOSIS — E1165 Type 2 diabetes mellitus with hyperglycemia: Secondary | ICD-10-CM | POA: Diagnosis not present

## 2021-02-06 LAB — CMP (CANCER CENTER ONLY)
ALT: 23 U/L (ref 0–44)
AST: 21 U/L (ref 15–41)
Albumin: 3.5 g/dL (ref 3.5–5.0)
Alkaline Phosphatase: 68 U/L (ref 38–126)
Anion gap: 11 (ref 5–15)
BUN: 21 mg/dL (ref 8–23)
CO2: 27 mmol/L (ref 22–32)
Calcium: 9.6 mg/dL (ref 8.9–10.3)
Chloride: 102 mmol/L (ref 98–111)
Creatinine: 1.04 mg/dL — ABNORMAL HIGH (ref 0.44–1.00)
GFR, Estimated: 56 mL/min — ABNORMAL LOW (ref >60)
Glucose, Bld: 78 mg/dL (ref 70–99)
Potassium: 3.7 mmol/L (ref 3.5–5.1)
Sodium: 140 mmol/L (ref 135–145)
Total Bilirubin: 0.8 mg/dL (ref 0.3–1.2)
Total Protein: 8.2 g/dL — ABNORMAL HIGH (ref 6.5–8.1)

## 2021-02-06 LAB — CBC WITH DIFFERENTIAL (CANCER CENTER ONLY)
Abs Immature Granulocytes: 0.05 10*3/uL (ref 0.00–0.07)
Basophils Absolute: 0.1 10*3/uL (ref 0.0–0.1)
Basophils Relative: 1 %
Eosinophils Absolute: 0.2 10*3/uL (ref 0.0–0.5)
Eosinophils Relative: 1 %
HCT: 33.5 % — ABNORMAL LOW (ref 36.0–46.0)
Hemoglobin: 11 g/dL — ABNORMAL LOW (ref 12.0–15.0)
Immature Granulocytes: 0 %
Lymphocytes Relative: 29 %
Lymphs Abs: 4.6 10*3/uL — ABNORMAL HIGH (ref 0.7–4.0)
MCH: 28.5 pg (ref 26.0–34.0)
MCHC: 32.8 g/dL (ref 30.0–36.0)
MCV: 86.8 fL (ref 80.0–100.0)
Monocytes Absolute: 0.8 10*3/uL (ref 0.1–1.0)
Monocytes Relative: 5 %
Neutro Abs: 10.1 10*3/uL — ABNORMAL HIGH (ref 1.7–7.7)
Neutrophils Relative %: 64 %
Platelet Count: 474 10*3/uL — ABNORMAL HIGH (ref 150–400)
RBC: 3.86 MIL/uL — ABNORMAL LOW (ref 3.87–5.11)
RDW: 20.6 % — ABNORMAL HIGH (ref 11.5–15.5)
WBC Count: 15.8 10*3/uL — ABNORMAL HIGH (ref 4.0–10.5)
nRBC: 0 % (ref 0.0–0.2)

## 2021-02-06 LAB — LACTATE DEHYDROGENASE: LDH: 143 U/L (ref 98–192)

## 2021-02-06 NOTE — Progress Notes (Signed)
    [facility] [phone]   [phone]  OFFICE PROGRESS NOTE  [name], [name], MD [address] [place] [address]  DIAGNOSIS: Essential thrombocythemia diagnosed in March 2022.  PRIOR THERAPY: None  CURRENT THERAPY: Hydroxyurea 500 mg p.o. daily.  Started October 23, 2020.  Starting July 14 she will be on hydroxyurea 1000 mg alternating with 500 mg every other day.  INTERVAL HISTORY: Ann [name] 75 y.o. female returns to the clinic today for follow-up visit.  The patient is feeling fine today with no concerning complaints except for mild fatigue.  She continues to tolerate her treatment with hydroxyurea fairly well.  She denied having any current chest pain, shortness of breath, cough or hemoptysis.  She denied having any fever or chills.  She has no nausea, vomiting, diarrhea or constipation.  She has no headache or visual changes.  She is here today for evaluation and repeat blood work.   MEDICAL HISTORY: Past Medical History:  Diagnosis Date   Diabetes mellitus without complication (HCC)    Hypertension     ALLERGIES:  is allergic to hydrocodone and sulfa antibiotics.  MEDICATIONS:  Current Outpatient Medications  Medication Sig Dispense Refill   allopurinol (ZYLOPRIM) 100 MG tablet Take 200 mg by mouth at bedtime.      amLODipine (NORVASC) 10 MG tablet Take 10 mg by mouth daily. Unknown dose     atenolol (TENORMIN) 50 MG tablet Take 50 mg by mouth daily. Unknown dose     clonazePAM (KLONOPIN) 0.5 MG tablet Take 0.5 mg by mouth daily as needed.     Continuous Blood Gluc Receiver (DEXCOM G5 RECEIVER KIT) DEVI by Does not apply route.     COVID-19 mRNA vaccine, Moderna, 100 MCG/0.5ML injection Inject into the muscle. 0.5 mL 0   estradiol (VIVELLE-DOT) 0.075 MG/24HR Place 1 patch onto the skin 2 (two) times a week.     fluticasone (FLONASE) 50 MCG/ACT nasal spray      glimepiride (AMARYL) 4 MG tablet TAKE 1 TABLET  BEFORE BREAKFAST AND 1/2 TABLET BEFORE DINNER 135 tablet 1   glucose blood (FREESTYLE TEST STRIPS) test strip Use 2x a day 100 each 11   hydroxyurea (HYDREA) 500 MG capsule Take 1 capsule (500 mg total) by mouth daily. May take with food to minimize GI side effects. 90 capsule 1   metFORMIN (GLUCOPHAGE-XR) 500 MG 24 hr tablet TAKE 1 TABLET BY MOUTH 2 TIMES DAILY WITH A MEAL. 180 tablet 0   montelukast (SINGULAIR) 10 MG tablet Take 10 mg by mouth at bedtime. Unknown dose     olmesartan (BENICAR) 20 MG tablet Take 20 mg by mouth daily.  0   pantoprazole (PROTONIX) 20 MG tablet Take 20 mg by mouth daily.     pravastatin (PRAVACHOL) 10 MG tablet Take 1 tablet (10 mg total) by mouth daily. 90 tablet 3   TRULICITY 1.5 MG/0.5ML SOPN INJECT INTO THE SKIN WEEKLY. 2 mL 1   No current facility-administered medications for this visit.    SURGICAL HISTORY:  Past Surgical History:  Procedure Laterality Date   ABDOMINAL HYSTERECTOMY     knee surgery  04/2013    REVIEW OF SYSTEMS:  A comprehensive review of systems was negative except for: Constitutional: positive for fatigue   PHYSICAL EXAMINATION: General appearance: alert, cooperative, and no distress Head: Normocephalic, without obvious abnormality, atraumatic Neck: no adenopathy, no JVD, supple, symmetrical, trachea midline, and thyroid not enlarged, symmetric, no tenderness/mass/nodules   Lymph nodes: Cervical, supraclavicular, and axillary nodes normal. Resp: clear to auscultation bilaterally Back: symmetric, no curvature. ROM normal. No CVA tenderness. Cardio: regular rate and rhythm, S1, S2 normal, no murmur, click, rub or gallop GI: soft, non-tender; bowel sounds normal; no masses,  no organomegaly Extremities: extremities normal, atraumatic, no cyanosis or edema  ECOG PERFORMANCE STATUS: 1 - Symptomatic but completely ambulatory  Blood pressure (!) 145/69, pulse 75, temperature 98 F (36.7 C), resp. rate 15, height 5' 2" (1.575 m), weight  151 lb 3.2 oz (68.6 kg), SpO2 100 %.  LABORATORY DATA: Lab Results  Component Value Date   WBC 15.8 (H) 02/06/2021   HGB 11.0 (L) 02/06/2021   HCT 33.5 (L) 02/06/2021   MCV 86.8 02/06/2021   PLT 474 (H) 02/06/2021      Chemistry      Component Value Date/Time   NA 140 12/26/2020 0855   K 3.4 (L) 12/26/2020 0855   CL 103 12/26/2020 0855   CO2 23 12/26/2020 0855   BUN 13 12/26/2020 0855   CREATININE 1.07 (H) 12/26/2020 0855      Component Value Date/Time   CALCIUM 9.7 12/26/2020 0855   ALKPHOS 81 12/26/2020 0855   AST 29 12/26/2020 0855   ALT 28 12/26/2020 0855   BILITOT 1.1 12/26/2020 0855       RADIOGRAPHIC STUDIES: No results found.  ASSESSMENT AND PLAN: This is a very pleasant 75 years old African-American female diagnosed with essential thrombocythemia in March 2022 with positive JAK2 mutation. The patient is currently undergoing treatment with hydroxyurea 500 mg p.o. daily. Repeat CBC today showed further increase of her platelets count to 474,000 and also increase in the total white blood count to 15.8. I recommended for the patient to increase her dose of hydroxyurea to 1000 mg alternating with 500 mg every other day. I will see her back for follow-up visit in 1 months for evaluation and repeat blood work. She was advised to call immediately if she has any other concerning symptoms in the interval.  The patient voices understanding of current disease status and treatment options and is in agreement with the current care plan.  All questions were answered. The patient knows to call the clinic with any problems, questions or concerns. We can certainly see the patient much sooner if necessary.  Disclaimer: This note was dictated with voice recognition software. Similar sounding words can inadvertently be transcribed and may not be corrected upon review.       

## 2021-02-10 ENCOUNTER — Ambulatory Visit (INDEPENDENT_AMBULATORY_CARE_PROVIDER_SITE_OTHER): Payer: Medicare PPO | Admitting: Internal Medicine

## 2021-02-10 ENCOUNTER — Other Ambulatory Visit: Payer: Self-pay

## 2021-02-10 ENCOUNTER — Encounter: Payer: Self-pay | Admitting: Internal Medicine

## 2021-02-10 VITALS — BP 110/68 | HR 79 | Ht 62.0 in | Wt 154.2 lb

## 2021-02-10 DIAGNOSIS — E785 Hyperlipidemia, unspecified: Secondary | ICD-10-CM | POA: Diagnosis not present

## 2021-02-10 DIAGNOSIS — E1129 Type 2 diabetes mellitus with other diabetic kidney complication: Secondary | ICD-10-CM

## 2021-02-10 DIAGNOSIS — R809 Proteinuria, unspecified: Secondary | ICD-10-CM

## 2021-02-10 LAB — POCT GLYCOSYLATED HEMOGLOBIN (HGB A1C): Hemoglobin A1C: 7 % — AB (ref 4.0–5.6)

## 2021-02-10 NOTE — Patient Instructions (Addendum)
Please continue: - Metformin ER 500 mg 2x a day - Amaryl 4 mg in am and 2 mg before dinner - Trulicity 1.5 mg weekly   Please return in 4 months with your sugar log.

## 2021-02-10 NOTE — Progress Notes (Signed)
Patient ID: Ann [name], female   DOB: 12-Aug-1945, 75 y.o.   MRN: [id number]   This visit occurred during the SARS-CoV-2 public health emergency.  Safety protocols were in place, including screening questions prior to the visit, additional usage of staff PPE, and extensive cleaning of exam room while observing appropriate contact time as indicated for disinfecting solutions.   HPI: Ann [name] is a 75 y.o.-year-old female, initially referred by Dr. Orland Mustard, now returning for f/u for DM2, dx in 2006, non-insulin-dependent, uncontrolled, with complications (microalbuminuria). Last visit 4 months ago. PCP: DR. [name].  Interim history: No increased urination, blurry vision, nausea, chest pain. She got the Dexcom CGM but she is on Hydroxyurea for Essential Thrombocytopenia and this interferes with the CGM reading -she did have erratic values.  Now checking with fingersticks. She had labs with PCP recently (results not available yet).  Reviewed HbA1c levels: Lab Results  Component Value Date   HGBA1C 7.1 (A) 10/17/2020   HGBA1C 7.1 (A) 06/13/2020   HGBA1C 6.9 (A) 02/08/2020   HGBA1C 7.3 02/07/2020   HGBA1C 7.1 (A) 10/05/2019   HGBA1C 7.1 (A) 06/16/2019   HGBA1C 6.7 (A) 02/03/2019   HGBA1C 7.3 (A) 09/30/2018   HGBA1C 6.5 (A) 06/06/2018   HGBA1C 7.9 (A) 03/15/2018   HGBA1C 6.7 10/07/2017   HGBA1C 6.7 06/03/2017   HGBA1C 6.8 02/24/2017   HGBA1C 7.0 11/12/2016   HGBA1C 6.8 08/11/2016  02/25/2016: 11.2% Previously 9.7% Previously 8.9%  Pt is on a regimen of: - Metformin ER 500 mg 2x a day - Amaryl 4 mg in a.m. and 2 mg before dinner - Trulicity 1.5 mg weekly She was on Metformin 1000 mg 2x a day >> diarrhea >> but resolved. She refused insulin.  Pt checks her sugars 0 to once a day: - am:  119-146 >> 120-159 >> 124-150s >> n/c >> 90, 120-140 - 2h after b'fast: n/c - before lunch: n/c - 2h after lunch: n/c - before dinner: 130-150 >> n/c >> 120-130 >> n/c >>  150-165 - 2h after dinner:122 >> 130, 180 >> 130s >> n/c >> 165-187 - bedtime:  140-156 >> 130s-160s, 201 >> n/c - nighttime: n/c >> 143-179 >> n/c Lowest sugar was 119 >> 120 >> 90; she has hypoglycemia awareness in the 70s. Highest sugar was 146 >> 180 (sick) >> 201 >> 187.  Glucometer: Freestyle Lite  Pt's meals are: - Breakfast: cereal, boiled egg, juice - Lunch: sandwich, salad - Dinner: meat + salad, potato - Snacks: 3 She saw nutrition in the past.  No CKD, last BUN/creatinine:  02/07/2020: BUN/creatinine 12/0.98 06/05/2019: BUN/creatinine 11/0.91, glucose 128, Calcium normal at 9.6 01/11/2018: CMP with a high glucose of 175.  BUN/creatinine normal 11/0.84, with the rest of the CMP being normal Lab Results  Component Value Date   BUN 21 02/06/2021   Lab Results  Component Value Date   CREATININE 1.04 (H) 02/06/2021  06/30/2016: 14/0.92 02/25/2016: 16/0.92, EGFR 60/73. At that time, glucose was 259, with a sodium of 133.    He has a history of elevated ACR 02/07/2020: ACR 39.5 Lab Results  Component Value Date   MICRALBCREAT 1.7 09/30/2018  05/28/2015: Urine ACR 45.6 On olmesartan.  + HL;last set of lipids: 02/07/2020: 120/155/32/61 01/11/2018:  168/156/38/98 07/30/2017: 154/240/32/74 No results found for: CHOL, HDL, LDLCALC, LDLDIRECT, TRIG, CHOLHDL 06/30/2016: 163/186/33/92 05/28/2015: 170/173/34/102 She started the Pravastatin 10  In 01/2018.  - last eye exam was in 11/2020: reportedly No DR, + macular degeneration.  She had laser surgery OS.  Dr. Thana Ates.  - no numbness and tingling in her feet.  ROS: Constitutional: no weight gain/no weight loss, no fatigue, no subjective hyperthermia, no subjective hypothermia Eyes: no blurry vision, no xerophthalmia ENT: no sore throat, no nodules palpated in neck, no dysphagia, no odynophagia, no hoarseness Cardiovascular: no CP/no SOB/no palpitations/no leg swelling Respiratory: no cough/no SOB/no  wheezing Gastrointestinal: no N/no V/no D/no C/no acid reflux Musculoskeletal: no muscle aches/no joint aches Skin: no rashes, no hair loss Neurological: no tremors/no numbness/no tingling/no dizziness  I reviewed pt's medications, allergies, PMH, social hx, family hx, and changes were documented in the history of present illness. Otherwise, unchanged from my initial visit note.  Past Medical History:  Diagnosis Date   Diabetes mellitus without complication ([place])    Hypertension    Past Surgical History:  Procedure Laterality Date   ABDOMINAL HYSTERECTOMY     knee surgery  04/2013   Social History   Social History   Marital status: Married    Spouse name: N/A   Number of children: 3   Occupational History   Retired Tourist information centre manager   Social History Main Topics   Smoking status: Never Smoker   Smokeless tobacco: No   Alcohol use No   Drug use: no   Current Outpatient Medications on File Prior to Visit  Medication Sig Dispense Refill   allopurinol (ZYLOPRIM) 100 MG tablet Take 200 mg by mouth at bedtime.      amLODipine (NORVASC) 10 MG tablet Take 10 mg by mouth daily. Unknown dose     atenolol (TENORMIN) 50 MG tablet Take 50 mg by mouth daily. Unknown dose     clonazePAM (KLONOPIN) 0.5 MG tablet Take 0.5 mg by mouth daily as needed.     Continuous Blood Gluc Receiver (DEXCOM G5 RECEIVER KIT) DEVI by Does not apply route.     COVID-19 mRNA vaccine, Moderna, 100 MCG/0.5ML injection Inject into the muscle. 0.5 mL 0   estradiol (VIVELLE-DOT) 0.075 MG/24HR Place 1 patch onto the skin 2 (two) times a week.     fluticasone (FLONASE) 50 MCG/ACT nasal spray      glimepiride (AMARYL) 4 MG tablet TAKE 1 TABLET BEFORE BREAKFAST AND 1/2 TABLET BEFORE DINNER 135 tablet 1   glucose blood (FREESTYLE TEST STRIPS) test strip Use 2x a day 100 each 11   hydroxyurea (HYDREA) 500 MG capsule Take 1 capsule (500 mg total) by mouth daily. May take with food to minimize GI side effects. 90 capsule 1    metFORMIN (GLUCOPHAGE-XR) 500 MG 24 hr tablet TAKE 1 TABLET BY MOUTH 2 TIMES DAILY WITH A MEAL. 180 tablet 0   montelukast (SINGULAIR) 10 MG tablet Take 10 mg by mouth at bedtime. Unknown dose     olmesartan (BENICAR) 20 MG tablet Take 20 mg by mouth daily.  0   pantoprazole (PROTONIX) 20 MG tablet Take 20 mg by mouth daily.     pravastatin (PRAVACHOL) 10 MG tablet Take 1 tablet (10 mg total) by mouth daily. 90 tablet 3   TRULICITY 1.5 GN/5.6OZ SOPN INJECT INTO THE SKIN WEEKLY. 2 mL 1   No current facility-administered medications on file prior to visit.   Allergies  Allergen Reactions   Hydrocodone Itching   Sulfa Antibiotics     FH: - Diabetes in mother, sister, brother - Hypertension in mother and sister - Hyperlipidemia in brother  PE: BP 110/68 (BP Location: Right Arm, Patient Position: Sitting, Cuff Size: Normal)  Pulse 79   Ht 5' 2"  (1.575 m)   Wt 154 lb 3.2 oz (69.9 kg)   SpO2 98%   BMI 28.20 kg/m  Wt Readings from Last 3 Encounters:  02/10/21 154 lb 3.2 oz (69.9 kg)  02/06/21 151 lb 3.2 oz (68.6 kg)  12/26/20 151 lb 8 oz (68.7 kg)   Constitutional: normal weight, in NAD Eyes: PERRLA, EOMI, no exophthalmos ENT: moist mucous membranes, no thyromegaly, no cervical lymphadenopathy Cardiovascular: RRR, No MRG Respiratory: CTA B Gastrointestinal: abdomen soft, NT, ND, BS+ Musculoskeletal: no deformities, strength intact in all 4 Skin: moist, warm, no rashes Neurological: no tremor with outstretched hands, DTR normal in all 4  ASSESSMENT: 1. DM2, non-insulin-dependent, uncontrolled, with complications - microalbuminuria  2. HL  3. MAU  PLAN:  1. Patient with longstanding, previously uncontrolled type 2 diabetes, with improvement in control after adding Trulicity to her oral regimen of metformin and sulfonylurea.  Before last visit, we moved her Amaryl dose from bedtime to before dinner.  At last visit, however, she was not checking sugars at all and I strongly  advised her to start.  She was inquiring about Dexcom CGM and she was able to start this, however, results were not accurate since she was on hydroxyurea so she stopped.  HbA1c at last visit was 7.1%, stable.  We did not change her regimen. -At today's visit, sugars are at or slightly above goal in the morning and they are also on goal before dinner, but she is having (watermelon) in the afternoon.  We discussed about not using this as a snack but has dessert, at the end of a meal.  We also discussed about possibly increasing the dose of Trulicity, which she is tolerating well, but for now, she would like to stay on the same dose and reevaluate at next visit.  We can definitely continue to just monitor her blood sugars for now on the current regimen. - I suggested to:  Patient Instructions  Please continue: - Metformin ER 500 mg 2x a day - Amaryl 4 mg in am and 2 mg before dinner - Trulicity 1.5 mg weekly   Please return in 4 months with your sugar log.   - we checked her HbA1c: 7.0% (lower) - advised to check sugars at different times of the day - 1x a day, rotating check times - advised for yearly eye exams >> she is UTD - return to clinic in 3-4 months  2. HL -Reviewed latest lipid panel from 01/2020: LDL at goal, triglycerides slightly high, HDL slightly low -Continues pravastatin 10 mg daily without side effects -Lab results pending from PCP  3. MAU -She has a history of microalbuminuria, with the latest level being slightly elevated at 39.5. -continue olmesartan 20 mg daily -Labs results pending from PCP  Philemon Kingdom, MD PhD [facility]

## 2021-02-15 ENCOUNTER — Other Ambulatory Visit: Payer: Self-pay | Admitting: Internal Medicine

## 2021-02-17 ENCOUNTER — Other Ambulatory Visit: Payer: Self-pay | Admitting: Internal Medicine

## 2021-03-12 DIAGNOSIS — L739 Follicular disorder, unspecified: Secondary | ICD-10-CM | POA: Diagnosis not present

## 2021-03-13 ENCOUNTER — Inpatient Hospital Stay: Payer: Medicare PPO | Attending: Internal Medicine | Admitting: Internal Medicine

## 2021-03-13 ENCOUNTER — Other Ambulatory Visit: Payer: Self-pay

## 2021-03-13 ENCOUNTER — Inpatient Hospital Stay: Payer: Medicare PPO

## 2021-03-13 VITALS — BP 139/81 | HR 74 | Temp 97.9°F | Resp 18 | Ht 62.0 in | Wt 154.5 lb

## 2021-03-13 DIAGNOSIS — Z79899 Other long term (current) drug therapy: Secondary | ICD-10-CM | POA: Insufficient documentation

## 2021-03-13 DIAGNOSIS — I10 Essential (primary) hypertension: Secondary | ICD-10-CM | POA: Insufficient documentation

## 2021-03-13 DIAGNOSIS — Z885 Allergy status to narcotic agent status: Secondary | ICD-10-CM | POA: Insufficient documentation

## 2021-03-13 DIAGNOSIS — D473 Essential (hemorrhagic) thrombocythemia: Secondary | ICD-10-CM

## 2021-03-13 DIAGNOSIS — E119 Type 2 diabetes mellitus without complications: Secondary | ICD-10-CM | POA: Diagnosis not present

## 2021-03-13 DIAGNOSIS — Z5111 Encounter for antineoplastic chemotherapy: Secondary | ICD-10-CM

## 2021-03-13 DIAGNOSIS — D649 Anemia, unspecified: Secondary | ICD-10-CM | POA: Insufficient documentation

## 2021-03-13 DIAGNOSIS — Z882 Allergy status to sulfonamides status: Secondary | ICD-10-CM | POA: Insufficient documentation

## 2021-03-13 LAB — CMP (CANCER CENTER ONLY)
ALT: 21 U/L (ref 0–44)
AST: 21 U/L (ref 15–41)
Albumin: 4 g/dL (ref 3.5–5.0)
Alkaline Phosphatase: 72 U/L (ref 38–126)
Anion gap: 12 (ref 5–15)
BUN: 12 mg/dL (ref 8–23)
CO2: 26 mmol/L (ref 22–32)
Calcium: 9.4 mg/dL (ref 8.9–10.3)
Chloride: 103 mmol/L (ref 98–111)
Creatinine: 1.12 mg/dL — ABNORMAL HIGH (ref 0.44–1.00)
GFR, Estimated: 52 mL/min — ABNORMAL LOW (ref >60)
Glucose, Bld: 125 mg/dL — ABNORMAL HIGH (ref 70–99)
Potassium: 3.8 mmol/L (ref 3.5–5.1)
Sodium: 141 mmol/L (ref 135–145)
Total Bilirubin: 0.7 mg/dL (ref 0.3–1.2)
Total Protein: 8.3 g/dL — ABNORMAL HIGH (ref 6.5–8.1)

## 2021-03-13 LAB — CBC WITH DIFFERENTIAL (CANCER CENTER ONLY)
Abs Immature Granulocytes: 0.02 10*3/uL (ref 0.00–0.07)
Basophils Absolute: 0.1 10*3/uL (ref 0.0–0.1)
Basophils Relative: 1 %
Eosinophils Absolute: 0.1 10*3/uL (ref 0.0–0.5)
Eosinophils Relative: 1 %
HCT: 31.7 % — ABNORMAL LOW (ref 36.0–46.0)
Hemoglobin: 10.1 g/dL — ABNORMAL LOW (ref 12.0–15.0)
Immature Granulocytes: 0 %
Lymphocytes Relative: 28 %
Lymphs Abs: 2.2 10*3/uL (ref 0.7–4.0)
MCH: 30.2 pg (ref 26.0–34.0)
MCHC: 31.9 g/dL (ref 30.0–36.0)
MCV: 94.9 fL (ref 80.0–100.0)
Monocytes Absolute: 0.4 10*3/uL (ref 0.1–1.0)
Monocytes Relative: 5 %
Neutro Abs: 5.2 10*3/uL (ref 1.7–7.7)
Neutrophils Relative %: 65 %
Platelet Count: 370 10*3/uL (ref 150–400)
RBC: 3.34 MIL/uL — ABNORMAL LOW (ref 3.87–5.11)
RDW: 14.8 % (ref 11.5–15.5)
WBC Count: 8 10*3/uL (ref 4.0–10.5)
nRBC: 0 % (ref 0.0–0.2)

## 2021-03-13 LAB — LACTATE DEHYDROGENASE: LDH: 150 U/L (ref 98–192)

## 2021-03-13 NOTE — Progress Notes (Signed)
[place] [phone]   Fax:([phone]  OFFICE PROGRESS NOTE  [name], MD [address] [place] [address]  DIAGNOSIS: Essential thrombocythemia diagnosed in March 2022.  PRIOR THERAPY: None  CURRENT THERAPY: Hydroxyurea 500 mg p.o. daily.  Started October 23, 2020.  Starting July 14 she will be on hydroxyurea 1000 mg alternating with 500 mg every other day.  INTERVAL HISTORY: Ann [name] 75 y.o. female returns to the clinic today for follow-up visit.  The patient is feeling fine today with no concerning complaints.  She denied having any fatigue or weakness.  She has no chest pain, shortness of breath, cough or hemoptysis.  She denied having any fever or chills.  She has no nausea, vomiting, diarrhea or constipation.  She has no weight loss or night sweats.  She has no bleeding issues.  She continues to tolerate her treatment with hydroxyurea fairly well.  She is here today for evaluation and repeat blood work.   MEDICAL HISTORY: Past Medical History:  Diagnosis Date   Diabetes mellitus without complication ([place])    Hypertension     ALLERGIES:  is allergic to hydrocodone and sulfa antibiotics.  MEDICATIONS:  Current Outpatient Medications  Medication Sig Dispense Refill   allopurinol (ZYLOPRIM) 100 MG tablet Take 200 mg by mouth at bedtime.      amLODipine (NORVASC) 10 MG tablet Take 10 mg by mouth daily. Unknown dose     atenolol (TENORMIN) 50 MG tablet Take 50 mg by mouth daily. Unknown dose     clonazePAM (KLONOPIN) 0.5 MG tablet Take 0.5 mg by mouth daily as needed.     Continuous Blood Gluc Receiver (DEXCOM G5 RECEIVER KIT) DEVI by Does not apply route.     COVID-19 mRNA vaccine, Moderna, 100 MCG/0.5ML injection Inject into the muscle. 0.5 mL 0   estradiol (VIVELLE-DOT) 0.075 MG/24HR Place 1 patch onto the skin 2 (two) times a week.     fluticasone (FLONASE) 50 MCG/ACT nasal spray      glimepiride  (AMARYL) 4 MG tablet TAKE 1 TABLET BEFORE BREAKFAST AND 1/2 TABLET BEFORE DINNER 135 tablet 1   glucose blood (FREESTYLE TEST STRIPS) test strip Use 2x a day 100 each 11   hydroxyurea (HYDREA) 500 MG capsule Take 1 capsule (500 mg total) by mouth daily. May take with food to minimize GI side effects. 90 capsule 1   metFORMIN (GLUCOPHAGE-XR) 500 MG 24 hr tablet TAKE 1 TABLET BY MOUTH 2 TIMES DAILY WITH A MEAL. 180 tablet 3   montelukast (SINGULAIR) 10 MG tablet Take 10 mg by mouth at bedtime. Unknown dose     olmesartan (BENICAR) 20 MG tablet Take 20 mg by mouth daily.  0   pantoprazole (PROTONIX) 20 MG tablet Take 20 mg by mouth daily.     pravastatin (PRAVACHOL) 10 MG tablet Take 1 tablet (10 mg total) by mouth daily. 90 tablet 3   TRULICITY 1.5 NT/7.0YF SOPN INJECT INTO THE SKIN WEEKLY. 6 mL 1   No current facility-administered medications for this visit.    SURGICAL HISTORY:  Past Surgical History:  Procedure Laterality Date   ABDOMINAL HYSTERECTOMY     knee surgery  04/2013    REVIEW OF SYSTEMS:  A comprehensive review of systems was negative.   PHYSICAL EXAMINATION: General appearance: alert, cooperative, and no distress Head: Normocephalic, without obvious abnormality, atraumatic Neck: no adenopathy, no JVD, supple, symmetrical, trachea midline, and thyroid not enlarged,  symmetric, no tenderness/mass/nodules Lymph nodes: Cervical, supraclavicular, and axillary nodes normal. Resp: clear to auscultation bilaterally Back: symmetric, no curvature. ROM normal. No CVA tenderness. Cardio: regular rate and rhythm, S1, S2 normal, no murmur, click, rub or gallop GI: soft, non-tender; bowel sounds normal; no masses,  no organomegaly Extremities: extremities normal, atraumatic, no cyanosis or edema  ECOG PERFORMANCE STATUS: 0 - Asymptomatic  Blood pressure 139/81, pulse 74, temperature 97.9 F (36.6 C), temperature source Oral, resp. rate 18, height _0  (1.575 m), weight 154 lb 8 oz  (70.1 kg), SpO2 100 %.  LABORATORY DATA: Lab Results  Component Value Date   WBC 15.8 (H) 02/06/2021   HGB 11.0 (L) 02/06/2021   HCT 33.5 (L) 02/06/2021   MCV 86.8 02/06/2021   PLT 474 (H) 02/06/2021      Chemistry      Component Value Date/Time   NA 140 02/06/2021 0825   K 3.7 02/06/2021 0825   CL 102 02/06/2021 0825   CO2 27 02/06/2021 0825   BUN 21 02/06/2021 0825   CREATININE 1.04 (H) 02/06/2021 0825      Component Value Date/Time   CALCIUM 9.6 02/06/2021 0825   ALKPHOS 68 02/06/2021 0825   AST 21 02/06/2021 0825   ALT 23 02/06/2021 0825   BILITOT 0.8 02/06/2021 0825       RADIOGRAPHIC STUDIES: No results found.  ASSESSMENT AND PLAN: This is a very pleasant 75 years old African-American female diagnosed with essential thrombocythemia in March 2022 with positive JAK2 mutation. She is currently on treatment with hydroxyurea 1000 mg alternating with 500 mg every other day.  She is tolerating this treatment well with no concerning adverse effects. Repeat CBC today showed improvement of her total white blood count as well as platelets count to the normal range.  She continues to have anemia with further decline in her hemoglobin and hematocrit. I recommended for the patient to continue on the current dose of hydroxyurea for now. I will see her back for follow-up visit in 1 months for evaluation and repeat blood work and if needed will adjust her dose. The patient was advised to call immediately if she has any other concerning symptoms in the interval. The patient voices understanding of current disease status and treatment options and is in agreement with the current care plan.  All questions were answered. The patient knows to call the clinic with any problems, questions or concerns. We can certainly see the patient much sooner if necessary.  Disclaimer: This note was dictated with voice recognition software. Similar sounding words can inadvertently be transcribed and may  not be corrected upon review.

## 2021-04-03 ENCOUNTER — Other Ambulatory Visit: Payer: Self-pay | Admitting: Internal Medicine

## 2021-04-10 DIAGNOSIS — Z6828 Body mass index (BMI) 28.0-28.9, adult: Secondary | ICD-10-CM | POA: Diagnosis not present

## 2021-04-10 DIAGNOSIS — Z1231 Encounter for screening mammogram for malignant neoplasm of breast: Secondary | ICD-10-CM | POA: Diagnosis not present

## 2021-04-10 DIAGNOSIS — Z01419 Encounter for gynecological examination (general) (routine) without abnormal findings: Secondary | ICD-10-CM | POA: Diagnosis not present

## 2021-04-11 DIAGNOSIS — Z20822 Contact with and (suspected) exposure to covid-19: Secondary | ICD-10-CM | POA: Diagnosis not present

## 2021-04-11 DIAGNOSIS — Z9189 Other specified personal risk factors, not elsewhere classified: Secondary | ICD-10-CM | POA: Diagnosis not present

## 2021-04-18 DIAGNOSIS — Z23 Encounter for immunization: Secondary | ICD-10-CM | POA: Diagnosis not present

## 2021-04-20 ENCOUNTER — Other Ambulatory Visit: Payer: Self-pay | Admitting: Internal Medicine

## 2021-04-21 ENCOUNTER — Inpatient Hospital Stay: Payer: Medicare PPO

## 2021-04-21 ENCOUNTER — Other Ambulatory Visit: Payer: Self-pay

## 2021-04-21 ENCOUNTER — Inpatient Hospital Stay: Payer: Medicare PPO | Attending: Internal Medicine | Admitting: Internal Medicine

## 2021-04-21 VITALS — BP 138/65 | HR 76 | Temp 97.7°F | Resp 16 | Wt 145.5 lb

## 2021-04-21 DIAGNOSIS — Z79899 Other long term (current) drug therapy: Secondary | ICD-10-CM | POA: Diagnosis not present

## 2021-04-21 DIAGNOSIS — D473 Essential (hemorrhagic) thrombocythemia: Secondary | ICD-10-CM | POA: Insufficient documentation

## 2021-04-21 LAB — CMP (CANCER CENTER ONLY)
ALT: 20 U/L (ref 0–44)
AST: 21 U/L (ref 15–41)
Albumin: 4.1 g/dL (ref 3.5–5.0)
Alkaline Phosphatase: 72 U/L (ref 38–126)
Anion gap: 12 (ref 5–15)
BUN: 17 mg/dL (ref 8–23)
CO2: 23 mmol/L (ref 22–32)
Calcium: 9.1 mg/dL (ref 8.9–10.3)
Chloride: 105 mmol/L (ref 98–111)
Creatinine: 1.19 mg/dL — ABNORMAL HIGH (ref 0.44–1.00)
GFR, Estimated: 48 mL/min — ABNORMAL LOW (ref >60)
Glucose, Bld: 141 mg/dL — ABNORMAL HIGH (ref 70–99)
Potassium: 3.5 mmol/L (ref 3.5–5.1)
Sodium: 140 mmol/L (ref 135–145)
Total Bilirubin: 0.5 mg/dL (ref 0.3–1.2)
Total Protein: 8.5 g/dL — ABNORMAL HIGH (ref 6.5–8.1)

## 2021-04-21 LAB — CBC WITH DIFFERENTIAL (CANCER CENTER ONLY)
Abs Immature Granulocytes: 0.04 10*3/uL (ref 0.00–0.07)
Basophils Absolute: 0.1 10*3/uL (ref 0.0–0.1)
Basophils Relative: 1 %
Eosinophils Absolute: 0.1 10*3/uL (ref 0.0–0.5)
Eosinophils Relative: 1 %
HCT: 32.9 % — ABNORMAL LOW (ref 36.0–46.0)
Hemoglobin: 10.9 g/dL — ABNORMAL LOW (ref 12.0–15.0)
Immature Granulocytes: 1 %
Lymphocytes Relative: 26 %
Lymphs Abs: 2.1 10*3/uL (ref 0.7–4.0)
MCH: 30.9 pg (ref 26.0–34.0)
MCHC: 33.1 g/dL (ref 30.0–36.0)
MCV: 93.2 fL (ref 80.0–100.0)
Monocytes Absolute: 0.3 10*3/uL (ref 0.1–1.0)
Monocytes Relative: 4 %
Neutro Abs: 5.4 10*3/uL (ref 1.7–7.7)
Neutrophils Relative %: 67 %
Platelet Count: 328 10*3/uL (ref 150–400)
RBC: 3.53 MIL/uL — ABNORMAL LOW (ref 3.87–5.11)
RDW: 13.6 % (ref 11.5–15.5)
WBC Count: 8.1 10*3/uL (ref 4.0–10.5)
nRBC: 0 % (ref 0.0–0.2)

## 2021-04-21 LAB — LACTATE DEHYDROGENASE: LDH: 156 U/L (ref 98–192)

## 2021-04-21 NOTE — Progress Notes (Signed)
[place] [phone]   Fax:([phone]  OFFICE PROGRESS NOTE  [name], MD [address] [place] [address]  DIAGNOSIS: Essential thrombocythemia diagnosed in March 2022.  PRIOR THERAPY: None  CURRENT THERAPY: Hydroxyurea 500 mg p.o. daily.  Started October 23, 2020.  Starting July 14 she will be on hydroxyurea 1000 mg alternating with 500 mg every other day.  INTERVAL HISTORY: Ann [name] 75 y.o. female returns to the clinic today for follow-up visit accompanied by her husband.  The patient is feeling fine today with no concerning complaints.  She denied having any chest pain, shortness of breath, cough or hemoptysis.  She denied having any fever or chills.  She has no nausea, vomiting, diarrhea or constipation.  She has no headache or visual changes.   MEDICAL HISTORY: Past Medical History:  Diagnosis Date   Diabetes mellitus without complication ([place])    Hypertension     ALLERGIES:  is allergic to hydrocodone and sulfa antibiotics.  MEDICATIONS:  Current Outpatient Medications  Medication Sig Dispense Refill   allopurinol (ZYLOPRIM) 100 MG tablet Take 200 mg by mouth at bedtime.      amLODipine (NORVASC) 10 MG tablet Take 10 mg by mouth daily. Unknown dose     atenolol (TENORMIN) 50 MG tablet Take 50 mg by mouth daily. Unknown dose     clonazePAM (KLONOPIN) 0.5 MG tablet Take 0.5 mg by mouth daily as needed.     Continuous Blood Gluc Receiver (DEXCOM G5 RECEIVER KIT) DEVI by Does not apply route.     COVID-19 mRNA vaccine, Moderna, 100 MCG/0.5ML injection Inject into the muscle. 0.5 mL 0   estradiol (VIVELLE-DOT) 0.075 MG/24HR Place 1 patch onto the skin 2 (two) times a week.     fluticasone (FLONASE) 50 MCG/ACT nasal spray      glimepiride (AMARYL) 4 MG tablet TAKE 1 TABLET BEFORE BREAKFAST AND 1/2 TABLET BEFORE DINNER 135 tablet 1   glucose blood (FREESTYLE TEST STRIPS) test strip Use 2x a day 100 each  11   hydroxyurea (HYDREA) 500 MG capsule TAKE 1 CAPSULE (500 MG TOTAL) BY MOUTH DAILY. MAY TAKE WITH FOOD TO MINIMIZE GI SIDE EFFECTS. 90 capsule 1   metFORMIN (GLUCOPHAGE-XR) 500 MG 24 hr tablet TAKE 1 TABLET BY MOUTH 2 TIMES DAILY WITH A MEAL. 180 tablet 3   montelukast (SINGULAIR) 10 MG tablet Take 10 mg by mouth at bedtime. Unknown dose     olmesartan (BENICAR) 20 MG tablet Take 20 mg by mouth daily.  0   pantoprazole (PROTONIX) 20 MG tablet Take 20 mg by mouth daily.     pravastatin (PRAVACHOL) 10 MG tablet Take 1 tablet (10 mg total) by mouth daily. 90 tablet 3   TRULICITY 1.5 PX/1.0GY SOPN INJECT INTO THE SKIN WEEKLY. 6 mL 1   No current facility-administered medications for this visit.    SURGICAL HISTORY:  Past Surgical History:  Procedure Laterality Date   ABDOMINAL HYSTERECTOMY     knee surgery  04/2013    REVIEW OF SYSTEMS:  A comprehensive review of systems was negative.   PHYSICAL EXAMINATION: General appearance: alert, cooperative, and no distress Head: Normocephalic, without obvious abnormality, atraumatic Neck: no adenopathy, no JVD, supple, symmetrical, trachea midline, and thyroid not enlarged, symmetric, no tenderness/mass/nodules Lymph nodes: Cervical, supraclavicular, and axillary nodes normal. Resp: clear to auscultation bilaterally Back: symmetric, no curvature. ROM normal. No CVA tenderness. Cardio: regular rate and rhythm, S1, S2  normal, no murmur, click, rub or gallop GI: soft, non-tender; bowel sounds normal; no masses,  no organomegaly Extremities: extremities normal, atraumatic, no cyanosis or edema  ECOG PERFORMANCE STATUS: 0 - Asymptomatic  Blood pressure 138/65, pulse 76, temperature 97.7 F (36.5 C), temperature source Oral, resp. rate 16, weight 145 lb 8 oz (66 kg), SpO2 100 %.  LABORATORY DATA: Lab Results  Component Value Date   WBC 8.1 04/21/2021   HGB 10.9 (L) 04/21/2021   HCT 32.9 (L) 04/21/2021   MCV 93.2 04/21/2021   PLT 328  04/21/2021      Chemistry      Component Value Date/Time   NA 141 03/13/2021 0754   K 3.8 03/13/2021 0754   CL 103 03/13/2021 0754   CO2 26 03/13/2021 0754   BUN 12 03/13/2021 0754   CREATININE 1.12 (H) 03/13/2021 0754      Component Value Date/Time   CALCIUM 9.4 03/13/2021 0754   ALKPHOS 72 03/13/2021 0754   AST 21 03/13/2021 0754   ALT 21 03/13/2021 0754   BILITOT 0.7 03/13/2021 0754       RADIOGRAPHIC STUDIES: No results found.  ASSESSMENT AND PLAN: This is a very pleasant 75 years old African-American female diagnosed with essential thrombocythemia in March 2022 with positive JAK2 mutation. She is currently on treatment with hydroxyurea 1000 mg alternating with 500 mg every other day.  The patient continues to tolerate her treatment well with no concerning adverse effects. CBC today showed persistent mild anemia but normal white blood count as well as platelets count. I recommended for the patient to continue her current treatment with hydroxyurea with the same dose for now. I will see her back for follow-up visit in 1 months for evaluation and repeat blood work. She was advised to call immediately if she has any concerning symptoms in the interval. The patient voices understanding of current disease status and treatment options and is in agreement with the current care plan.  All questions were answered. The patient knows to call the clinic with any problems, questions or concerns. We can certainly see the patient much sooner if necessary.  Disclaimer: This note was dictated with voice recognition software. Similar sounding words can inadvertently be transcribed and may not be corrected upon review.

## 2021-05-12 DIAGNOSIS — L723 Sebaceous cyst: Secondary | ICD-10-CM | POA: Diagnosis not present

## 2021-05-12 DIAGNOSIS — R208 Other disturbances of skin sensation: Secondary | ICD-10-CM | POA: Diagnosis not present

## 2021-05-12 DIAGNOSIS — L728 Other follicular cysts of the skin and subcutaneous tissue: Secondary | ICD-10-CM | POA: Diagnosis not present

## 2021-05-14 DIAGNOSIS — H35053 Retinal neovascularization, unspecified, bilateral: Secondary | ICD-10-CM | POA: Diagnosis not present

## 2021-05-14 DIAGNOSIS — H3562 Retinal hemorrhage, left eye: Secondary | ICD-10-CM | POA: Diagnosis not present

## 2021-05-14 DIAGNOSIS — H43811 Vitreous degeneration, right eye: Secondary | ICD-10-CM | POA: Diagnosis not present

## 2021-05-14 DIAGNOSIS — H43393 Other vitreous opacities, bilateral: Secondary | ICD-10-CM | POA: Diagnosis not present

## 2021-05-14 DIAGNOSIS — H35371 Puckering of macula, right eye: Secondary | ICD-10-CM | POA: Diagnosis not present

## 2021-05-19 ENCOUNTER — Other Ambulatory Visit: Payer: Medicare PPO

## 2021-05-20 ENCOUNTER — Other Ambulatory Visit: Payer: Medicare PPO

## 2021-05-20 ENCOUNTER — Inpatient Hospital Stay: Payer: Medicare PPO

## 2021-05-20 ENCOUNTER — Inpatient Hospital Stay: Payer: Medicare PPO | Attending: Internal Medicine | Admitting: Internal Medicine

## 2021-05-20 ENCOUNTER — Other Ambulatory Visit: Payer: Self-pay

## 2021-05-20 VITALS — BP 149/55 | HR 66 | Temp 97.7°F | Resp 17 | Wt 155.7 lb

## 2021-05-20 DIAGNOSIS — Z7984 Long term (current) use of oral hypoglycemic drugs: Secondary | ICD-10-CM | POA: Insufficient documentation

## 2021-05-20 DIAGNOSIS — D473 Essential (hemorrhagic) thrombocythemia: Secondary | ICD-10-CM

## 2021-05-20 DIAGNOSIS — Z79899 Other long term (current) drug therapy: Secondary | ICD-10-CM | POA: Diagnosis not present

## 2021-05-20 DIAGNOSIS — E119 Type 2 diabetes mellitus without complications: Secondary | ICD-10-CM | POA: Insufficient documentation

## 2021-05-20 DIAGNOSIS — U071 COVID-19: Secondary | ICD-10-CM | POA: Diagnosis not present

## 2021-05-20 DIAGNOSIS — I10 Essential (primary) hypertension: Secondary | ICD-10-CM | POA: Diagnosis not present

## 2021-05-20 LAB — CMP (CANCER CENTER ONLY)
ALT: 22 U/L (ref 0–44)
AST: 23 U/L (ref 15–41)
Albumin: 4 g/dL (ref 3.5–5.0)
Alkaline Phosphatase: 61 U/L (ref 38–126)
Anion gap: 11 (ref 5–15)
BUN: 15 mg/dL (ref 8–23)
CO2: 25 mmol/L (ref 22–32)
Calcium: 9.4 mg/dL (ref 8.9–10.3)
Chloride: 104 mmol/L (ref 98–111)
Creatinine: 1.11 mg/dL — ABNORMAL HIGH (ref 0.44–1.00)
GFR, Estimated: 52 mL/min — ABNORMAL LOW (ref >60)
Glucose, Bld: 121 mg/dL — ABNORMAL HIGH (ref 70–99)
Potassium: 3.5 mmol/L (ref 3.5–5.1)
Sodium: 140 mmol/L (ref 135–145)
Total Bilirubin: 0.8 mg/dL (ref 0.3–1.2)
Total Protein: 8.3 g/dL — ABNORMAL HIGH (ref 6.5–8.1)

## 2021-05-20 LAB — CBC WITH DIFFERENTIAL (CANCER CENTER ONLY)
Abs Immature Granulocytes: 0.02 10*3/uL (ref 0.00–0.07)
Basophils Absolute: 0.1 10*3/uL (ref 0.0–0.1)
Basophils Relative: 1 %
Eosinophils Absolute: 0 10*3/uL (ref 0.0–0.5)
Eosinophils Relative: 0 %
HCT: 31.2 % — ABNORMAL LOW (ref 36.0–46.0)
Hemoglobin: 10.5 g/dL — ABNORMAL LOW (ref 12.0–15.0)
Immature Granulocytes: 0 %
Lymphocytes Relative: 24 %
Lymphs Abs: 1.5 10*3/uL (ref 0.7–4.0)
MCH: 31 pg (ref 26.0–34.0)
MCHC: 33.7 g/dL (ref 30.0–36.0)
MCV: 92 fL (ref 80.0–100.0)
Monocytes Absolute: 0.5 10*3/uL (ref 0.1–1.0)
Monocytes Relative: 8 %
Neutro Abs: 4.3 10*3/uL (ref 1.7–7.7)
Neutrophils Relative %: 67 %
Platelet Count: 273 10*3/uL (ref 150–400)
RBC: 3.39 MIL/uL — ABNORMAL LOW (ref 3.87–5.11)
RDW: 13.9 % (ref 11.5–15.5)
WBC Count: 6.5 10*3/uL (ref 4.0–10.5)
nRBC: 0 % (ref 0.0–0.2)

## 2021-05-20 NOTE — Progress Notes (Signed)
[place] [phone]   Fax:([phone]) [phone]  OFFICE PROGRESS NOTE  [name], MD [address] [place] [address]  DIAGNOSIS: Essential thrombocythemia diagnosed in March 2022.  PRIOR THERAPY: None  CURRENT THERAPY: Hydroxyurea 500 mg p.o. daily.  Started October 23, 2020.  Starting July 14 she is on hydroxyurea 1000 mg alternating with 500 mg every other day.  INTERVAL HISTORY: Ann [name] 75 y.o. female returns to the clinic today for follow-up visit accompanied by her husband.  The patient is feeling fine today with no concerning complaints.  She denied having any chest pain, shortness of breath, cough or hemoptysis.  She denied having any nausea, vomiting, diarrhea or constipation.  She has no bleeding, bruises or ecchymosis.  She has no fever or chills.  She has no weight loss or night sweats.  She has occasional numbness in her fingers.  She continues to tolerate her treatment with hydroxyurea fairly well.  She is here today for evaluation and repeat blood work.   MEDICAL HISTORY: Past Medical History:  Diagnosis Date   Diabetes mellitus without complication ([place])    Hypertension     ALLERGIES:  is allergic to hydrocodone and sulfa antibiotics.  MEDICATIONS:  Current Outpatient Medications  Medication Sig Dispense Refill   allopurinol (ZYLOPRIM) 100 MG tablet Take 200 mg by mouth at bedtime.      amLODipine (NORVASC) 10 MG tablet Take 10 mg by mouth daily. Unknown dose     atenolol (TENORMIN) 50 MG tablet Take 50 mg by mouth daily. Unknown dose     clonazePAM (KLONOPIN) 0.5 MG tablet Take 0.5 mg by mouth daily as needed.     Continuous Blood Gluc Receiver (DEXCOM G5 RECEIVER KIT) DEVI by Does not apply route.     COVID-19 mRNA vaccine, Moderna, 100 MCG/0.5ML injection Inject into the muscle. 0.5 mL 0   estradiol (VIVELLE-DOT) 0.075 MG/24HR Place 1 patch onto the skin 2 (two) times a week.     fluticasone  (FLONASE) 50 MCG/ACT nasal spray      glimepiride (AMARYL) 4 MG tablet TAKE 1 TABLET BEFORE BREAKFAST AND 1/2 TABLET BEFORE DINNER 135 tablet 1   glucose blood (FREESTYLE TEST STRIPS) test strip Use 2x a day 100 each 11   hydroxyurea (HYDREA) 500 MG capsule TAKE 1 CAPSULE (500 MG TOTAL) BY MOUTH DAILY. MAY TAKE WITH FOOD TO MINIMIZE GI SIDE EFFECTS. 90 capsule 1   metFORMIN (GLUCOPHAGE-XR) 500 MG 24 hr tablet TAKE 1 TABLET BY MOUTH 2 TIMES DAILY WITH A MEAL. 180 tablet 3   montelukast (SINGULAIR) 10 MG tablet Take 10 mg by mouth at bedtime. Unknown dose     olmesartan (BENICAR) 20 MG tablet Take 20 mg by mouth daily.  0   pantoprazole (PROTONIX) 20 MG tablet Take 20 mg by mouth daily.     pravastatin (PRAVACHOL) 10 MG tablet Take 1 tablet (10 mg total) by mouth daily. 90 tablet 3   TRULICITY 1.5 WC/5.8NI SOPN INJECT INTO THE SKIN WEEKLY. 6 mL 1   No current facility-administered medications for this visit.    SURGICAL HISTORY:  Past Surgical History:  Procedure Laterality Date   ABDOMINAL HYSTERECTOMY     knee surgery  04/2013    REVIEW OF SYSTEMS:  A comprehensive review of systems was negative.   PHYSICAL EXAMINATION: General appearance: alert, cooperative, and no distress Head: Normocephalic, without obvious abnormality, atraumatic Neck: no adenopathy, no JVD, supple, symmetrical,  trachea midline, and thyroid not enlarged, symmetric, no tenderness/mass/nodules Lymph nodes: Cervical, supraclavicular, and axillary nodes normal. Resp: clear to auscultation bilaterally Back: symmetric, no curvature. ROM normal. No CVA tenderness. Cardio: regular rate and rhythm, S1, S2 normal, no murmur, click, rub or gallop GI: soft, non-tender; bowel sounds normal; no masses,  no organomegaly Extremities: extremities normal, atraumatic, no cyanosis or edema  ECOG PERFORMANCE STATUS: 0 - Asymptomatic  Blood pressure (!) 149/55, pulse 66, temperature 97.7 F (36.5 C), temperature source Oral, resp.  rate 17, weight 155 lb 11.2 oz (70.6 kg), SpO2 100 %.  LABORATORY DATA: Lab Results  Component Value Date   WBC 6.5 05/20/2021   HGB 10.5 (L) 05/20/2021   HCT 31.2 (L) 05/20/2021   MCV 92.0 05/20/2021   PLT 273 05/20/2021      Chemistry      Component Value Date/Time   NA 140 04/21/2021 0839   K 3.5 04/21/2021 0839   CL 105 04/21/2021 0839   CO2 23 04/21/2021 0839   BUN 17 04/21/2021 0839   CREATININE 1.19 (H) 04/21/2021 0839      Component Value Date/Time   CALCIUM 9.1 04/21/2021 0839   ALKPHOS 72 04/21/2021 0839   AST 21 04/21/2021 0839   ALT 20 04/21/2021 0839   BILITOT 0.5 04/21/2021 0839       RADIOGRAPHIC STUDIES: No results found.  ASSESSMENT AND PLAN: This is a very pleasant 75 years old African-American female diagnosed with essential thrombocythemia in March 2022 with positive JAK2 mutation. She is currently on treatment with hydroxyurea 1000 mg alternating with 500 mg every other day.  She has been tolerating this treatment well with no concerning adverse effects. Repeat CBC showed normal white blood count and platelets but the patient continues to have mild anemia. Comprehensive metabolic panel and LDH are still pending. I recommended for the patient to continue her current treatment with hydroxyurea with the same dose. I will see her back for follow-up visit in 2 months for evaluation and repeat blood work. The patient was advised to call immediately if she has any other concerning symptoms in the interval. The patient voices understanding of current disease status and treatment options and is in agreement with the current care plan.  All questions were answered. The patient knows to call the clinic with any problems, questions or concerns. We can certainly see the patient much sooner if necessary.  Disclaimer: This note was dictated with voice recognition software. Similar sounding words can inadvertently be transcribed and may not be corrected upon  review.

## 2021-05-23 DIAGNOSIS — U071 COVID-19: Secondary | ICD-10-CM | POA: Diagnosis not present

## 2021-05-30 ENCOUNTER — Telehealth: Payer: Self-pay | Admitting: Medical Oncology

## 2021-05-30 DIAGNOSIS — D473 Essential (hemorrhagic) thrombocythemia: Secondary | ICD-10-CM

## 2021-05-30 MED ORDER — HYDROXYUREA 500 MG PO CAPS: 500.0000 mg | ORAL_CAPSULE | ORAL | 1 refills | Status: DC

## 2021-05-30 NOTE — Telephone Encounter (Signed)
Hydrea refill requested.

## 2021-06-05 ENCOUNTER — Ambulatory Visit: Payer: Medicare PPO | Admitting: Podiatry

## 2021-06-05 ENCOUNTER — Other Ambulatory Visit: Payer: Self-pay

## 2021-06-05 DIAGNOSIS — L608 Other nail disorders: Secondary | ICD-10-CM | POA: Diagnosis not present

## 2021-06-09 ENCOUNTER — Ambulatory Visit: Payer: Medicare PPO | Admitting: Internal Medicine

## 2021-06-09 ENCOUNTER — Other Ambulatory Visit: Payer: Self-pay

## 2021-06-09 ENCOUNTER — Encounter: Payer: Self-pay | Admitting: Internal Medicine

## 2021-06-09 VITALS — BP 128/72 | HR 82 | Ht 62.0 in | Wt 156.2 lb

## 2021-06-09 DIAGNOSIS — E785 Hyperlipidemia, unspecified: Secondary | ICD-10-CM | POA: Diagnosis not present

## 2021-06-09 DIAGNOSIS — E1129 Type 2 diabetes mellitus with other diabetic kidney complication: Secondary | ICD-10-CM | POA: Diagnosis not present

## 2021-06-09 DIAGNOSIS — R809 Proteinuria, unspecified: Secondary | ICD-10-CM

## 2021-06-09 LAB — POCT GLYCOSYLATED HEMOGLOBIN (HGB A1C): Hemoglobin A1C: 6.5 % — AB (ref 4.0–5.6)

## 2021-06-09 MED ORDER — TRULICITY 3 MG/0.5ML ~~LOC~~ SOAJ: 3.0000 mg | SUBCUTANEOUS | 3 refills | Status: DC

## 2021-06-09 NOTE — Progress Notes (Signed)
  Subjective:  Patient ID: Ann [name], female    DOB: August 14, 1945,  MRN: [id number]  Diabetic foot exam, A1c 7.0, discoloration of toenails that is not painful  75 y.o. female presents with the above complaint. History confirmed with patient.  Mostly is concerned about discoloration and wondering what it is  Objective:  Physical Exam: warm, good capillary refill, no trophic changes or ulcerative lesions, normal DP and PT pulses, normal sensory exam, and multiple longitudinal melanonychia of both feet.  Negative Hutchinson signs all toes      Assessment:   1. Melanonychia      Plan:  Patient was evaluated and treated and all questions answered.  Patient educated on diabetes. Discussed proper diabetic foot care and discussed risks and complications of disease. Educated patient in depth on reasons to return to the office immediately should he/she discover anything concerning or new on the feet. All questions answered. Discussed proper shoes as well.   Discussed the discoloration of the toenails is actually melanonychia striata.  Advised her to continue to monitor these, she currently has no evidence of local invasion or Hutchinson signs and she will monitor for the symptoms.  Photographs were taken.  Return if symptoms worsen or fail to improve.

## 2021-06-09 NOTE — Progress Notes (Signed)
Patient ID: Ann [name], female   DOB: Aug 12, 1945, 75 y.o.   MRN: [id number]   This visit occurred during the SARS-CoV-2 public health emergency.  Safety protocols were in place, including screening questions prior to the visit, additional usage of staff PPE, and extensive cleaning of exam room while observing appropriate contact time as indicated for disinfecting solutions.   HPI: Ann [name] is a 75 y.o.-year-old female, initially referred by Dr. Orland Mustard, now returning for f/u for DM2, dx in 2006, non-insulin-dependent, uncontrolled, with complications (microalbuminuria). Last visit 4 months ago. PCP: DR. [name].  Interim history: No increased urination, blurry vision, nausea, chest pain. She got the Dexcom CGM but she is on Hydroxyurea for Essential Thrombocytopenia and this interferes with the CGM reading -she did have erratic values.  Currently checking with fingersticks.  Reviewed HbA1c levels: Lab Results  Component Value Date   HGBA1C 7.0 (A) 02/10/2021   HGBA1C 7.1 (A) 10/17/2020   HGBA1C 7.1 (A) 06/13/2020   HGBA1C 6.9 (A) 02/08/2020   HGBA1C 7.3 02/07/2020   HGBA1C 7.1 (A) 10/05/2019   HGBA1C 7.1 (A) 06/16/2019   HGBA1C 6.7 (A) 02/03/2019   HGBA1C 7.3 (A) 09/30/2018   HGBA1C 6.5 (A) 06/06/2018   HGBA1C 7.9 (A) 03/15/2018   HGBA1C 6.7 10/07/2017   HGBA1C 6.7 06/03/2017   HGBA1C 6.8 02/24/2017   HGBA1C 7.0 11/12/2016  02/25/2016: 11.2% Previously 9.7% Previously 8.9%  Pt is on a regimen of: - Metformin ER 500 mg 2x a day - Amaryl 4 mg in a.m. and 2 mg before dinner - Trulicity 1.5 mg weekly  She was on Metformin 1000 mg 2x a day >> diarrhea >> but resolved. She refused insulin.  Pt checks her sugars 0 to once a day: - am:  119-146 >> 120-159 >> 124-150s >> n/c >> 90, 120-140 >> 126-156, 179 (cupcakes) - 2h after b'fast: n/c - before lunch: n/c >> 136 (sick) - 2h after lunch: n/c - before dinner: 130-150 >> n/c >> 120-130 >> n/c >> 150-165 >>  150s - 2h after dinner:122 >> 130, 180 >> 130s >> n/c >> 165-187 >> <160 - bedtime:  140-156 >> 130s-160s, 201 >> n/c - nighttime: n/c >> 143-179 >> n/c Lowest sugar was 119 >> 120 >> 90; she has hypoglycemia awareness in the 70s. Highest sugar was 201 >> 187 >> 179  Glucometer: Freestyle Lite  Pt's meals are: - Breakfast: cereal, boiled egg, juice - Lunch: sandwich, salad - Dinner: meat + salad, potato - Snacks: 3 She saw nutrition in the past.  No CKD, last BUN/creatinine:  02/06/2021: 21/0.98, GFR 60, glucose 99 02/07/2020: BUN/creatinine 12/0.98 06/05/2019: BUN/creatinine 11/0.91, glucose 128, Calcium normal at 9.6 01/11/2018: CMP with a high glucose of 175.  BUN/creatinine normal 11/0.84, with the rest of the CMP being normal Lab Results  Component Value Date   BUN 15 05/20/2021   Lab Results  Component Value Date   CREATININE 1.11 (H) 05/20/2021  06/30/2016: 14/0.92 02/25/2016: 16/0.92, EGFR 60/73. At that time, glucose was 259, with a sodium of 133.    He has a history of elevated ACR 02/07/2019: ACR 19.8 02/07/2020: ACR 39.5 Lab Results  Component Value Date   MICRALBCREAT 1.7 09/30/2018  05/28/2015: Urine ACR 45.6 On olmesartan.  + HL;last set of lipids: 02/06/2021: 129/156/46/57 02/07/2020: 120/155/32/61 01/11/2018:  168/156/38/98 07/30/2017: 154/240/32/74 No results found for: CHOL, HDL, LDLCALC, LDLDIRECT, TRIG, CHOLHDL 06/30/2016: 163/186/33/92 05/28/2015: 170/173/34/102 She started the Pravastatin 10  In 01/2018.  - last eye  exam was in 05/2021: reportedly No DR, + macular degeneration.  She had laser surgery OS.  Dr. Thana Ates.  - no numbness and tingling in her feet.  UTD with flu vaccine.  ROS: + see HPI  I reviewed pt's medications, allergies, PMH, social hx, family hx, and changes were documented in the history of present illness. Otherwise, unchanged from my initial visit note.  Past Medical History:  Diagnosis Date   Diabetes mellitus without  complication ([place])    Hypertension    Past Surgical History:  Procedure Laterality Date   ABDOMINAL HYSTERECTOMY     knee surgery  04/2013   Social History   Social History   Marital status: Married    Spouse name: N/A   Number of children: 3   Occupational History   Retired Tourist information centre manager   Social History Main Topics   Smoking status: Never Smoker   Smokeless tobacco: No   Alcohol use No   Drug use: no   Current Outpatient Medications on File Prior to Visit  Medication Sig Dispense Refill   allopurinol (ZYLOPRIM) 100 MG tablet Take 200 mg by mouth at bedtime.      amLODipine (NORVASC) 10 MG tablet Take 10 mg by mouth daily. Unknown dose     atenolol (TENORMIN) 50 MG tablet Take 50 mg by mouth daily. Unknown dose     clonazePAM (KLONOPIN) 0.5 MG tablet Take 0.5 mg by mouth daily as needed.     Continuous Blood Gluc Receiver (DEXCOM G5 RECEIVER KIT) DEVI by Does not apply route.     COVID-19 mRNA vaccine, Moderna, 100 MCG/0.5ML injection Inject into the muscle. 0.5 mL 0   estradiol (VIVELLE-DOT) 0.075 MG/24HR Place 1 patch onto the skin 2 (two) times a week.     fluticasone (FLONASE) 50 MCG/ACT nasal spray      glimepiride (AMARYL) 4 MG tablet TAKE 1 TABLET BEFORE BREAKFAST AND 1/2 TABLET BEFORE DINNER 135 tablet 1   glucose blood (FREESTYLE TEST STRIPS) test strip Use 2x a day 100 each 11   hydroxyurea (HYDREA) 500 MG capsule Take 1 capsule (500 mg total) by mouth as directed. Take hydroxyurea 1000 mg alternating with 500 mg every other day.May take with food to minimize GI side effects. 90 capsule 1   metFORMIN (GLUCOPHAGE-XR) 500 MG 24 hr tablet TAKE 1 TABLET BY MOUTH 2 TIMES DAILY WITH A MEAL. 180 tablet 3   montelukast (SINGULAIR) 10 MG tablet Take 10 mg by mouth at bedtime. Unknown dose     olmesartan (BENICAR) 20 MG tablet Take 20 mg by mouth daily.  0   pantoprazole (PROTONIX) 20 MG tablet Take 20 mg by mouth daily.     pravastatin (PRAVACHOL) 10 MG tablet Take 1 tablet (10  mg total) by mouth daily. 90 tablet 3   TRULICITY 1.5 WG/6.6ZL SOPN INJECT INTO THE SKIN WEEKLY. 6 mL 1   No current facility-administered medications on file prior to visit.   Allergies  Allergen Reactions   Hydrocodone Itching   Sulfa Antibiotics     FH: - Diabetes in mother, sister, brother - Hypertension in mother and sister - Hyperlipidemia in brother  PE: There were no vitals taken for this visit. Wt Readings from Last 3 Encounters:  05/20/21 155 lb 11.2 oz (70.6 kg)  04/21/21 145 lb 8 oz (66 kg)  03/13/21 154 lb 8 oz (70.1 kg)   Constitutional: normal weight, in NAD Eyes: PERRLA, EOMI, no exophthalmos ENT: moist mucous membranes, no thyromegaly, no  cervical lymphadenopathy Cardiovascular: RRR, No MRG Respiratory: CTA B Gastrointestinal: abdomen soft, NT, ND, BS+ Musculoskeletal: no deformities, strength intact in all 4 Skin: moist, warm, no rashes Neurological: no tremor with outstretched hands, DTR normal in all 4  ASSESSMENT: 1. DM2, non-insulin-dependent, uncontrolled, with complications - microalbuminuria  2. HL  3. MAU  PLAN:  1. Patient with longstanding, previously uncontrolled type 2 diabetes, with improvement in control after adding Trulicity to her oral regimen with metformin and sulfonylurea.  At last visit, sugars were at or slightly above goal so we discussed about timing of eating snacks/fruit, but we did not change her regimen at that time.  HbA1c was lower, at 7.0%. -At today's visit, sugars are at the upper limit of the goal interval or slightly higher in the morning and they are also slightly higher later in the day. -We did discuss about possibly using a higher dose of Amaryl before a larger dinner especially now with holidays approaching, but I also suggested to try to increase the Trulicity dose to 3 mg weekly.  She agrees with this change.  And if she finishes her 1.5 mg pen supply, I advised her to take 2 doses of 1.5 mg at the same time once a  week (she takes these on Tuesdays).  We will continue the same dose of metformin for now. - I suggested to:  Patient Instructions  Please continue: - Metformin ER 500 mg 2x a day - Amaryl 4 mg in am and 2 (may try 4 mg before a large meal) before dinner  Increase: - Trulicity 3 mg weekly   Please return in 4 months with your sugar log.   - we checked her HbA1c: 6.5% (lower, but I suspect that there is incongruence due to hydroxyurea). - advised to check sugars at different times of the day - 1x a day, rotating check times - advised for yearly eye exams >> she is UTD - return to clinic in 4 months  2. HL -Reviewed latest lipid panel from 02/06/2021:  fractions at goal with exception of a slightly high triglyceride level at 156 (unclear if fasting sample) -Continues pravastatin 10 mg daily without side effects  3. MAU -She has a history of microalbuminuria with previous level being slightly high, at 39.5, but latest level from 02/06/2021 was lower, at 19.8, now normal -Continue olmesartan 20 mg daily  Philemon Kingdom, MD PhD [facility] Endocrinology

## 2021-06-09 NOTE — Patient Instructions (Addendum)
Please continue: - Metformin ER 500 mg 2x a day - Amaryl 4 mg in am and 2 (may try 4 mg before a large meal) before dinner  Increase: - Trulicity 3 mg weekly   Please return in 4 months with your sugar log.

## 2021-06-24 ENCOUNTER — Other Ambulatory Visit: Payer: Self-pay | Admitting: Physician Assistant

## 2021-06-24 DIAGNOSIS — D473 Essential (hemorrhagic) thrombocythemia: Secondary | ICD-10-CM

## 2021-07-14 ENCOUNTER — Other Ambulatory Visit: Payer: Self-pay | Admitting: Internal Medicine

## 2021-07-15 ENCOUNTER — Other Ambulatory Visit: Payer: Self-pay | Admitting: Internal Medicine

## 2021-07-15 ENCOUNTER — Telehealth: Payer: Self-pay | Admitting: Internal Medicine

## 2021-07-15 MED ORDER — TRULICITY 1.5 MG/0.5ML ~~LOC~~ SOAJ: 1.5000 mg | SUBCUTANEOUS | 11 refills | Status: DC

## 2021-07-15 NOTE — Telephone Encounter (Signed)
Patient has now been informed of Trulicity 1.5 mg sent to pharmacy

## 2021-07-15 NOTE — Telephone Encounter (Signed)
I sent a prescription for the Trulicity 1.5 mg dose, for 1 month, with refills.  However, as soon as the pharmacy gets it, I would encourage her to get the 3 mg dose.

## 2021-07-15 NOTE — Telephone Encounter (Signed)
MEDICATION: Dulaglutide (TRULICITY) 3 VC/9.4WH SOPN  PHARMACY:   [id number] [address] [place], [place] - [address] Phone:  [phone]  Fax:  [phone]      HAS THE PATIENT CONTACTED THEIR PHARMACY?  yes  IS THIS A 90 DAY SUPPLY : yes  IS PATIENT OUT OF MEDICATION: yes  IF NOT; HOW MUCH IS LEFT:   LAST APPOINTMENT DATE: @12 /19/2022  NEXT APPOINTMENT DATE:@3 /14/2023  DO WE HAVE YOUR PERMISSION TO LEAVE A DETAILED MESSAGE?:  OTHER COMMENTS: Pharmacy unable to provide the above medication at 3 MG. PT request if 1.5 MG can be issued. IF NOT, adjust the original dosage.   **Let patient know to contact pharmacy at the end of the day to make sure medication is ready. **  ** Please notify patient to allow 48-72 hours to process**  **Encourage patient to contact the pharmacy for refills or they can request refills through [facility]**

## 2021-07-23 ENCOUNTER — Other Ambulatory Visit: Payer: Self-pay | Admitting: Physician Assistant

## 2021-07-23 DIAGNOSIS — D473 Essential (hemorrhagic) thrombocythemia: Secondary | ICD-10-CM

## 2021-08-05 ENCOUNTER — Other Ambulatory Visit: Payer: Self-pay

## 2021-08-05 ENCOUNTER — Inpatient Hospital Stay: Payer: Medicare PPO | Admitting: Internal Medicine

## 2021-08-05 ENCOUNTER — Inpatient Hospital Stay: Payer: Medicare PPO | Attending: Internal Medicine

## 2021-08-05 ENCOUNTER — Encounter: Payer: Self-pay | Admitting: Internal Medicine

## 2021-08-05 VITALS — BP 143/62 | HR 72 | Temp 97.2°F | Resp 17 | Wt 158.6 lb

## 2021-08-05 DIAGNOSIS — D473 Essential (hemorrhagic) thrombocythemia: Secondary | ICD-10-CM | POA: Diagnosis not present

## 2021-08-05 DIAGNOSIS — Z5111 Encounter for antineoplastic chemotherapy: Secondary | ICD-10-CM

## 2021-08-05 DIAGNOSIS — E119 Type 2 diabetes mellitus without complications: Secondary | ICD-10-CM | POA: Diagnosis not present

## 2021-08-05 DIAGNOSIS — I10 Essential (primary) hypertension: Secondary | ICD-10-CM | POA: Insufficient documentation

## 2021-08-05 DIAGNOSIS — Z90710 Acquired absence of both cervix and uterus: Secondary | ICD-10-CM | POA: Insufficient documentation

## 2021-08-05 LAB — CBC WITH DIFFERENTIAL (CANCER CENTER ONLY)
Abs Immature Granulocytes: 0.03 10*3/uL (ref 0.00–0.07)
Basophils Absolute: 0.1 10*3/uL (ref 0.0–0.1)
Basophils Relative: 1 %
Eosinophils Absolute: 0.1 10*3/uL (ref 0.0–0.5)
Eosinophils Relative: 1 %
HCT: 32.2 % — ABNORMAL LOW (ref 36.0–46.0)
Hemoglobin: 10.7 g/dL — ABNORMAL LOW (ref 12.0–15.0)
Immature Granulocytes: 0 %
Lymphocytes Relative: 31 %
Lymphs Abs: 2.4 10*3/uL (ref 0.7–4.0)
MCH: 31.3 pg (ref 26.0–34.0)
MCHC: 33.2 g/dL (ref 30.0–36.0)
MCV: 94.2 fL (ref 80.0–100.0)
Monocytes Absolute: 0.4 10*3/uL (ref 0.1–1.0)
Monocytes Relative: 5 %
Neutro Abs: 4.7 10*3/uL (ref 1.7–7.7)
Neutrophils Relative %: 62 %
Platelet Count: 330 10*3/uL (ref 150–400)
RBC: 3.42 MIL/uL — ABNORMAL LOW (ref 3.87–5.11)
RDW: 13.5 % (ref 11.5–15.5)
WBC Count: 7.7 10*3/uL (ref 4.0–10.5)
nRBC: 0 % (ref 0.0–0.2)

## 2021-08-05 LAB — CMP (CANCER CENTER ONLY)
ALT: 16 U/L (ref 0–44)
AST: 17 U/L (ref 15–41)
Albumin: 4.2 g/dL (ref 3.5–5.0)
Alkaline Phosphatase: 64 U/L (ref 38–126)
Anion gap: 10 (ref 5–15)
BUN: 24 mg/dL — ABNORMAL HIGH (ref 8–23)
CO2: 27 mmol/L (ref 22–32)
Calcium: 9.1 mg/dL (ref 8.9–10.3)
Chloride: 103 mmol/L (ref 98–111)
Creatinine: 1.11 mg/dL — ABNORMAL HIGH (ref 0.44–1.00)
GFR, Estimated: 52 mL/min — ABNORMAL LOW (ref >60)
Glucose, Bld: 124 mg/dL — ABNORMAL HIGH (ref 70–99)
Potassium: 3.5 mmol/L (ref 3.5–5.1)
Sodium: 140 mmol/L (ref 135–145)
Total Bilirubin: 0.4 mg/dL (ref 0.3–1.2)
Total Protein: 8.2 g/dL — ABNORMAL HIGH (ref 6.5–8.1)

## 2021-08-05 LAB — LACTATE DEHYDROGENASE: LDH: 136 U/L (ref 98–192)

## 2021-08-05 NOTE — Progress Notes (Signed)
[facility] [phone]   Fax:([phone]  OFFICE PROGRESS NOTE  [name], MD [address] [place] [address]  DIAGNOSIS: Essential thrombocythemia diagnosed in March 2022.  PRIOR THERAPY: None  CURRENT THERAPY: Hydroxyurea 500 mg p.o. daily.  Started October 23, 2020.  Starting July 14 she is on hydroxyurea 1000 mg alternating with 500 mg every other day.  INTERVAL HISTORY: Ann [name] 76 y.o. female returns to the clinic today for follow-up visit accompanied by her husband.  The patient is feeling fine today with no concerning complaints.  She denied having any fatigue or weakness.  She denied having any chest pain, shortness of breath, cough or hemoptysis.  She has no nausea, vomiting, diarrhea or constipation.  She has no headache or visual changes.  She denied having any significant weight loss or night sweats.  She continues to tolerate her treatment with hydroxyurea fairly well.  She is here for evaluation and repeat blood work.   MEDICAL HISTORY: Past Medical History:  Diagnosis Date   Diabetes mellitus without complication ([place])    Hypertension     ALLERGIES:  is allergic to hydrocodone and sulfa antibiotics.  MEDICATIONS:  Current Outpatient Medications  Medication Sig Dispense Refill   Dulaglutide (TRULICITY) 1.5 EK/8.0KL SOPN Inject 1.5 mg into the skin once a week. 2 mL 11   allopurinol (ZYLOPRIM) 100 MG tablet Take 200 mg by mouth at bedtime.      amLODipine (NORVASC) 10 MG tablet Take 10 mg by mouth daily. Unknown dose     atenolol (TENORMIN) 50 MG tablet Take 50 mg by mouth daily. Unknown dose     clonazePAM (KLONOPIN) 0.5 MG tablet Take 0.5 mg by mouth daily as needed.     Continuous Blood Gluc Receiver (DEXCOM G5 RECEIVER KIT) DEVI by Does not apply route. (Patient not taking: Reported on 06/09/2021)     COVID-19 mRNA vaccine, Moderna, 100 MCG/0.5ML injection Inject into the muscle. 0.5 mL 0    Dulaglutide (TRULICITY) 3 KJ/1.7HX SOPN Inject 3 mg into the skin once a week. 6 mL 3   estradiol (VIVELLE-DOT) 0.075 MG/24HR Place 1 patch onto the skin 2 (two) times a week.     fluticasone (FLONASE) 50 MCG/ACT nasal spray      glimepiride (AMARYL) 4 MG tablet TAKE 1 TABLET BEFORE BREAKFAST AND 1/2 TABLET BEFORE DINNER 135 tablet 1   glucose blood (FREESTYLE TEST STRIPS) test strip Use 2x a day 100 each 11   hydroxyurea (HYDREA) 500 MG capsule TAKE 1 CAPSULE BY MOUTH ONCE DAILY ,ALTERNATING WITH 2 DAILY .MAY TAKE WITH FOOD TO MINIMIZE GI SIDE EFFECTS. 90 capsule 1   metFORMIN (GLUCOPHAGE-XR) 500 MG 24 hr tablet TAKE 1 TABLET BY MOUTH 2 TIMES DAILY WITH A MEAL. 180 tablet 3   montelukast (SINGULAIR) 10 MG tablet Take 10 mg by mouth at bedtime. Unknown dose     olmesartan (BENICAR) 20 MG tablet Take 20 mg by mouth daily.  0   pantoprazole (PROTONIX) 20 MG tablet Take 20 mg by mouth daily.     pravastatin (PRAVACHOL) 10 MG tablet Take 1 tablet (10 mg total) by mouth daily. 90 tablet 3   No current facility-administered medications for this visit.    SURGICAL HISTORY:  Past Surgical History:  Procedure Laterality Date   ABDOMINAL HYSTERECTOMY     knee surgery  04/2013    REVIEW OF SYSTEMS:  A comprehensive review of systems was  negative.   PHYSICAL EXAMINATION: General appearance: alert, cooperative, and no distress Head: Normocephalic, without obvious abnormality, atraumatic Neck: no adenopathy, no JVD, supple, symmetrical, trachea midline, and thyroid not enlarged, symmetric, no tenderness/mass/nodules Lymph nodes: Cervical, supraclavicular, and axillary nodes normal. Resp: clear to auscultation bilaterally Back: symmetric, no curvature. ROM normal. No CVA tenderness. Cardio: regular rate and rhythm, S1, S2 normal, no murmur, click, rub or gallop GI: soft, non-tender; bowel sounds normal; no masses,  no organomegaly Extremities: extremities normal, atraumatic, no cyanosis or  edema  ECOG PERFORMANCE STATUS: 0 - Asymptomatic  Blood pressure (!) 143/62, pulse 72, temperature (!) 97.2 F (36.2 C), temperature source Oral, resp. rate 17, weight 158 lb 9.6 oz (71.9 kg), SpO2 98 %.  LABORATORY DATA: Lab Results  Component Value Date   WBC 7.7 08/05/2021   HGB 10.7 (L) 08/05/2021   HCT 32.2 (L) 08/05/2021   MCV 94.2 08/05/2021   PLT 330 08/05/2021      Chemistry      Component Value Date/Time   NA 140 08/05/2021 0831   K 3.5 08/05/2021 0831   CL 103 08/05/2021 0831   CO2 27 08/05/2021 0831   BUN 24 (H) 08/05/2021 0831   CREATININE 1.11 (H) 08/05/2021 0831      Component Value Date/Time   CALCIUM 9.1 08/05/2021 0831   ALKPHOS 64 08/05/2021 0831   AST 17 08/05/2021 0831   ALT 16 08/05/2021 0831   BILITOT 0.4 08/05/2021 0831       RADIOGRAPHIC STUDIES: No results found.  ASSESSMENT AND PLAN: This is a very pleasant 76 years old African-American female diagnosed with essential thrombocythemia in March 2022 with positive JAK2 mutation. She is currently on treatment with hydroxyurea 1000 mg alternating with 500 mg every other day.  She has been tolerating this treatment well with no concerning adverse effects. Repeat blood work today is unremarkable except for the mild anemia. I recommended for her to continue her current treatment with hydroxyurea with the same regimen. I will see her back for follow-up visit in 2 months for evaluation and repeat blood work. She was advised to call immediately if she has any other concerning symptoms in the interval. The patient voices understanding of current disease status and treatment options and is in agreement with the current care plan.  All questions were answered. The patient knows to call the clinic with any problems, questions or concerns. We can certainly see the patient much sooner if necessary.  Disclaimer: This note was dictated with voice recognition software. Similar sounding words can inadvertently  be transcribed and may not be corrected upon review.

## 2021-08-12 DIAGNOSIS — Z1589 Genetic susceptibility to other disease: Secondary | ICD-10-CM | POA: Diagnosis not present

## 2021-08-12 DIAGNOSIS — Z7984 Long term (current) use of oral hypoglycemic drugs: Secondary | ICD-10-CM | POA: Diagnosis not present

## 2021-08-12 DIAGNOSIS — E785 Hyperlipidemia, unspecified: Secondary | ICD-10-CM | POA: Diagnosis not present

## 2021-08-12 DIAGNOSIS — M109 Gout, unspecified: Secondary | ICD-10-CM | POA: Diagnosis not present

## 2021-08-12 DIAGNOSIS — E1165 Type 2 diabetes mellitus with hyperglycemia: Secondary | ICD-10-CM | POA: Diagnosis not present

## 2021-08-12 DIAGNOSIS — I10 Essential (primary) hypertension: Secondary | ICD-10-CM | POA: Diagnosis not present

## 2021-08-12 DIAGNOSIS — F411 Generalized anxiety disorder: Secondary | ICD-10-CM | POA: Diagnosis not present

## 2021-10-02 ENCOUNTER — Inpatient Hospital Stay: Payer: Medicare PPO | Attending: Internal Medicine

## 2021-10-02 ENCOUNTER — Encounter: Payer: Self-pay | Admitting: Internal Medicine

## 2021-10-02 ENCOUNTER — Other Ambulatory Visit: Payer: Medicare PPO

## 2021-10-02 ENCOUNTER — Other Ambulatory Visit: Payer: Self-pay

## 2021-10-02 ENCOUNTER — Ambulatory Visit: Payer: Medicare PPO | Admitting: Internal Medicine

## 2021-10-02 ENCOUNTER — Inpatient Hospital Stay: Payer: Medicare PPO | Admitting: Internal Medicine

## 2021-10-02 VITALS — BP 137/58 | HR 73 | Temp 97.6°F | Resp 19 | Ht 62.0 in | Wt 155.5 lb

## 2021-10-02 DIAGNOSIS — D473 Essential (hemorrhagic) thrombocythemia: Secondary | ICD-10-CM | POA: Diagnosis not present

## 2021-10-02 DIAGNOSIS — I10 Essential (primary) hypertension: Secondary | ICD-10-CM

## 2021-10-02 DIAGNOSIS — Z5111 Encounter for antineoplastic chemotherapy: Secondary | ICD-10-CM

## 2021-10-02 DIAGNOSIS — E119 Type 2 diabetes mellitus without complications: Secondary | ICD-10-CM | POA: Insufficient documentation

## 2021-10-02 DIAGNOSIS — D649 Anemia, unspecified: Secondary | ICD-10-CM | POA: Diagnosis not present

## 2021-10-02 LAB — CBC WITH DIFFERENTIAL (CANCER CENTER ONLY)
Abs Immature Granulocytes: 0.02 10*3/uL (ref 0.00–0.07)
Basophils Absolute: 0.1 10*3/uL (ref 0.0–0.1)
Basophils Relative: 1 %
Eosinophils Absolute: 0.1 10*3/uL (ref 0.0–0.5)
Eosinophils Relative: 1 %
HCT: 30.7 % — ABNORMAL LOW (ref 36.0–46.0)
Hemoglobin: 10.1 g/dL — ABNORMAL LOW (ref 12.0–15.0)
Immature Granulocytes: 0 %
Lymphocytes Relative: 30 %
Lymphs Abs: 2 10*3/uL (ref 0.7–4.0)
MCH: 31 pg (ref 26.0–34.0)
MCHC: 32.9 g/dL (ref 30.0–36.0)
MCV: 94.2 fL (ref 80.0–100.0)
Monocytes Absolute: 0.3 10*3/uL (ref 0.1–1.0)
Monocytes Relative: 5 %
Neutro Abs: 4.1 10*3/uL (ref 1.7–7.7)
Neutrophils Relative %: 63 %
Platelet Count: 312 10*3/uL (ref 150–400)
RBC: 3.26 MIL/uL — ABNORMAL LOW (ref 3.87–5.11)
RDW: 14.2 % (ref 11.5–15.5)
WBC Count: 6.5 10*3/uL (ref 4.0–10.5)
nRBC: 0 % (ref 0.0–0.2)

## 2021-10-02 LAB — CMP (CANCER CENTER ONLY)
ALT: 16 U/L (ref 0–44)
AST: 18 U/L (ref 15–41)
Albumin: 4.2 g/dL (ref 3.5–5.0)
Alkaline Phosphatase: 63 U/L (ref 38–126)
Anion gap: 9 (ref 5–15)
BUN: 17 mg/dL (ref 8–23)
CO2: 26 mmol/L (ref 22–32)
Calcium: 9.4 mg/dL (ref 8.9–10.3)
Chloride: 104 mmol/L (ref 98–111)
Creatinine: 1.04 mg/dL — ABNORMAL HIGH (ref 0.44–1.00)
GFR, Estimated: 56 mL/min — ABNORMAL LOW (ref >60)
Glucose, Bld: 113 mg/dL — ABNORMAL HIGH (ref 70–99)
Potassium: 3.6 mmol/L (ref 3.5–5.1)
Sodium: 139 mmol/L (ref 135–145)
Total Bilirubin: 0.7 mg/dL (ref 0.3–1.2)
Total Protein: 7.9 g/dL (ref 6.5–8.1)

## 2021-10-02 LAB — LACTATE DEHYDROGENASE: LDH: 122 U/L (ref 98–192)

## 2021-10-02 NOTE — Progress Notes (Signed)
?    [facility] ?[phone]   [phone] ? ?OFFICE PROGRESS NOTE ? ?[name], MD ?[address] ?[place] [address] ? ?DIAGNOSIS: Essential thrombocythemia diagnosed in March 2022. ? ?PRIOR THERAPY: None ? ?CURRENT THERAPY: Hydroxyurea 500 mg p.o. daily.  Started October 23, 2020.  Starting July 14 she is on hydroxyurea 1000 mg alternating with 500 mg every other day. ? ?INTERVAL HISTORY: ?Ann [name] 76 y.o. female returns to the clinic today for follow-up visit.  The patient is feeling fine today with no concerning complaints.  Unfortunately her husband had recent stroke affecting his speech.  She denied having any current chest pain, shortness of breath, cough or hemoptysis.  She denied having any fever or chills.  She has no nausea, vomiting, diarrhea or constipation.  She has no headache or visual changes.  She denied having any weight loss or night sweats.  She continues to tolerate her treatment with hydroxyurea fairly well.  She has some darkening of her skin especially close to the nails because of the chemotherapy.  She is here today for evaluation and repeat blood work. ? ? ? ?MEDICAL HISTORY: ?Past Medical History:  ?Diagnosis Date  ? Diabetes mellitus without complication ([place])   ? Hypertension   ? ? ?ALLERGIES:  is allergic to hydrocodone and sulfa antibiotics. ? ?MEDICATIONS:  ?Current Outpatient Medications  ?Medication Sig Dispense Refill  ? Dulaglutide (TRULICITY) 1.5 HU/3.1SH SOPN Inject 1.5 mg into the skin once a week. 2 mL 11  ? allopurinol (ZYLOPRIM) 100 MG tablet Take 200 mg by mouth at bedtime.     ? amLODipine (NORVASC) 10 MG tablet Take 10 mg by mouth daily. Unknown dose    ? atenolol (TENORMIN) 50 MG tablet Take 50 mg by mouth daily. Unknown dose    ? clonazePAM (KLONOPIN) 0.5 MG tablet Take 0.5 mg by mouth daily as needed.    ? Continuous Blood Gluc Receiver (DEXCOM G5 RECEIVER KIT) DEVI by Does not apply route.  (Patient not taking: Reported on 06/09/2021)    ? COVID-19 mRNA vaccine, Moderna, 100 MCG/0.5ML injection Inject into the muscle. 0.5 mL 0  ? Dulaglutide (TRULICITY) 3 FW/2.6VZ SOPN Inject 3 mg into the skin once a week. 6 mL 3  ? estradiol (VIVELLE-DOT) 0.075 MG/24HR Place 1 patch onto the skin 2 (two) times a week.    ? fluticasone (FLONASE) 50 MCG/ACT nasal spray     ? glimepiride (AMARYL) 4 MG tablet TAKE 1 TABLET BEFORE BREAKFAST AND 1/2 TABLET BEFORE DINNER 135 tablet 1  ? glucose blood (FREESTYLE TEST STRIPS) test strip Use 2x a day 100 each 11  ? hydroxyurea (HYDREA) 500 MG capsule TAKE 1 CAPSULE BY MOUTH ONCE DAILY ,ALTERNATING WITH 2 DAILY .MAY TAKE WITH FOOD TO MINIMIZE GI SIDE EFFECTS. 90 capsule 1  ? metFORMIN (GLUCOPHAGE-XR) 500 MG 24 hr tablet TAKE 1 TABLET BY MOUTH 2 TIMES DAILY WITH A MEAL. 180 tablet 3  ? montelukast (SINGULAIR) 10 MG tablet Take 10 mg by mouth at bedtime. Unknown dose    ? olmesartan (BENICAR) 20 MG tablet Take 20 mg by mouth daily.  0  ? pantoprazole (PROTONIX) 20 MG tablet Take 20 mg by mouth daily.    ? pravastatin (PRAVACHOL) 10 MG tablet Take 1 tablet (10 mg total) by mouth daily. 90 tablet 3  ? ?No current facility-administered medications for this visit.  ? ? ?SURGICAL HISTORY:  ?Past Surgical History:  ?Procedure Laterality Date  ?  ABDOMINAL HYSTERECTOMY    ? knee surgery  04/2013  ? ? ?REVIEW OF SYSTEMS:  A comprehensive review of systems was negative.  ? ?PHYSICAL EXAMINATION: General appearance: alert, cooperative, and no distress ?Head: Normocephalic, without obvious abnormality, atraumatic ?Neck: no adenopathy, no JVD, supple, symmetrical, trachea midline, and thyroid not enlarged, symmetric, no tenderness/mass/nodules ?Lymph nodes: Cervical, supraclavicular, and axillary nodes normal. ?Resp: clear to auscultation bilaterally ?Back: symmetric, no curvature. ROM normal. No CVA tenderness. ?Cardio: regular rate and rhythm, S1, S2 normal, no murmur, click, rub or  gallop ?GI: soft, non-tender; bowel sounds normal; no masses,  no organomegaly ?Extremities: extremities normal, atraumatic, no cyanosis or edema ? ?ECOG PERFORMANCE STATUS: 0 - Asymptomatic ? ?Blood pressure (!) 137/58, pulse 73, temperature 97.6 ?F (36.4 ?C), temperature source Tympanic, resp. rate 19, height 5' 2"  (1.575 m), weight 155 lb 8 oz (70.5 kg), SpO2 100 %. ? ?LABORATORY DATA: ?Lab Results  ?Component Value Date  ? WBC 6.5 10/02/2021  ? HGB 10.1 (L) 10/02/2021  ? HCT 30.7 (L) 10/02/2021  ? MCV 94.2 10/02/2021  ? PLT 312 10/02/2021  ? ? ?  Chemistry   ?   ?Component Value Date/Time  ? NA 140 08/05/2021 0831  ? K 3.5 08/05/2021 0831  ? CL 103 08/05/2021 0831  ? CO2 27 08/05/2021 0831  ? BUN 24 (H) 08/05/2021 0831  ? CREATININE 1.11 (H) 08/05/2021 0831  ?    ?Component Value Date/Time  ? CALCIUM 9.1 08/05/2021 0831  ? ALKPHOS 64 08/05/2021 0831  ? AST 17 08/05/2021 0831  ? ALT 16 08/05/2021 0831  ? BILITOT 0.4 08/05/2021 0831  ?  ? ? ? ?RADIOGRAPHIC STUDIES: ?No results found. ? ?ASSESSMENT AND PLAN: This is a very pleasant 76 years old African-American female diagnosed with essential thrombocythemia in March 2022 with positive JAK2 mutation. ?She is currently on treatment with hydroxyurea 1000 mg alternating with 500 mg every other day.  ?The patient has been tolerating her treatment well with no concerning adverse effect except for some darkening of the skin close to the nails. ?She had repeat CBC today that showed normal total white blood count as well as platelet count but she has persistent mild anemia. ?I recommended for the patient to continue her current treatment with hydroxyurea with the same dose. ?I will see her back for follow-up visit in 2 months for evaluation and repeat CBC, comprehensive metabolic panel and LDH. ?She was advised to call immediately if she has any other concerning symptoms in the interval. ?The patient voices understanding of current disease status and treatment options and  is in agreement with the current care plan. ? ?All questions were answered. The patient knows to call the clinic with any problems, questions or concerns. We can certainly see the patient much sooner if necessary. ? ?Disclaimer: This note was dictated with voice recognition software. Similar sounding words can inadvertently be transcribed and may not be corrected upon review. ? ? ?  ?  ?

## 2021-10-07 ENCOUNTER — Ambulatory Visit (INDEPENDENT_AMBULATORY_CARE_PROVIDER_SITE_OTHER): Payer: Medicare PPO | Admitting: Internal Medicine

## 2021-10-07 ENCOUNTER — Encounter: Payer: Self-pay | Admitting: Internal Medicine

## 2021-10-07 ENCOUNTER — Other Ambulatory Visit: Payer: Self-pay

## 2021-10-07 VITALS — BP 120/68 | HR 70 | Ht 62.0 in | Wt 157.6 lb

## 2021-10-07 DIAGNOSIS — E785 Hyperlipidemia, unspecified: Secondary | ICD-10-CM | POA: Diagnosis not present

## 2021-10-07 DIAGNOSIS — R809 Proteinuria, unspecified: Secondary | ICD-10-CM | POA: Diagnosis not present

## 2021-10-07 DIAGNOSIS — E1129 Type 2 diabetes mellitus with other diabetic kidney complication: Secondary | ICD-10-CM

## 2021-10-07 LAB — POCT GLYCOSYLATED HEMOGLOBIN (HGB A1C): Hemoglobin A1C: 6.3 % — AB (ref 4.0–5.6)

## 2021-10-07 NOTE — Patient Instructions (Addendum)
Please continue: ?- Metformin ER 500 mg 2x a day ?- Amaryl 4 mg in am and 2-4 mg before dinner ?- Trulicity 1.5 mg weekly ? ? Please return in 4 months with your sugar log.  ?

## 2021-10-07 NOTE — Progress Notes (Signed)
Patient ID: Ann [name], female   DOB: 02-01-1946, 76 y.o.   MRN: [id number]  ? ?This visit occurred during the SARS-CoV-2 public health emergency.  Safety protocols were in place, including screening questions prior to the visit, additional usage of staff PPE, and extensive cleaning of exam room while observing appropriate contact time as indicated for disinfecting solutions.  ? ?HPI: ?Ann [name] is a 76 y.o.-year-old female, initially referred by Dr. Orland Mustard, now returning for f/u for DM2, dx in 2006, non-insulin-dependent, uncontrolled, with complications (microalbuminuria). Last visit 4 months ago. ?PCP: DR. [name]. ? ?Interim history: ?No increased urination, blurry vision, nausea, chest pain. ?She got the Dexcom CGM but she is on Hydroxyurea for Essential Thrombocytopenia and she had erratic values on this.  Currently checking with fingersticks. ?She has a lot of stress since last OV  - husband had a stroke - he recovered well. ? ?Reviewed HbA1c levels: ?Lab Results  ?Component Value Date  ? HGBA1C 6.5 (A) 06/09/2021  ? HGBA1C 7.0 (A) 02/10/2021  ? HGBA1C 7.1 (A) 10/17/2020  ? HGBA1C 7.1 (A) 06/13/2020  ? HGBA1C 6.9 (A) 02/08/2020  ? HGBA1C 7.3 02/07/2020  ? HGBA1C 7.1 (A) 10/05/2019  ? HGBA1C 7.1 (A) 06/16/2019  ? HGBA1C 6.7 (A) 02/03/2019  ? HGBA1C 7.3 (A) 09/30/2018  ? HGBA1C 6.5 (A) 06/06/2018  ? HGBA1C 7.9 (A) 03/15/2018  ? HGBA1C 6.7 10/07/2017  ? HGBA1C 6.7 06/03/2017  ? HGBA1C 6.8 02/24/2017  ?02/25/2016: 11.2% ?Previously 9.7% ?Previously 8.9% ? ?Pt is on a regimen of: ?- Metformin ER 500 mg 2x a day ?- Amaryl 4 mg in a.m. and 2 mg  (did not use the 4 mg) before dinner ?- Trulicity 1.5 >> 3 >> 1.5 mg weekly  ?She was on Metformin 1000 mg 2x a day >> diarrhea >> but resolved. ?She refused insulin. ? ?Pt checks her sugars 0 to once a day: ?- am: 90, 120-140 >> 126-156, 179 (cupcakes) >> 120-130 ?- 2h after b'fast: n/c ?- before lunch: n/c >> 136 (sick) >> n/c ?- 2h after lunch: n/c ?-  before dinner: n/c >> 150-165 >> 150s >> 142-156 ?- 2h after dinner: 130s >> n/c >> 165-187 >> <160 >> n/c ?- bedtime:  140-156 >> 130s-160s, 201 >> n/c >> 150s-160 ?- nighttime: n/c >> 143-179 >> n/c ?Lowest sugar was 119 >> 120 >> 90 >> 120; she has hypoglycemia awareness in the 70s. ?Highest sugar was 201 >> 187 >> 179 >> 166. ? ?Glucometer: Freestyle Lite ? ?Pt's meals are: ?- Breakfast: cereal, boiled egg, juice ?- Lunch: sandwich, salad ?- Dinner: meat + salad, potato ?- Snacks: 3 ?She saw nutrition in the past. ? ?No CKD, last BUN/creatinine:  ?Lab Results  ?Component Value Date  ? BUN 17 10/02/2021  ? ?Lab Results  ?Component Value Date  ? CREATININE 1.04 (H) 10/02/2021  ?02/06/2021: 21/0.98, GFR 60, glucose 99 ?02/07/2020: BUN/creatinine 12/0.98 ?06/05/2019: BUN/creatinine 11/0.91, glucose 128, Calcium normal at 9.6 ?01/11/2018: CMP with a high glucose of 175.  BUN/creatinine normal 11/0.84, with the rest of the CMP being normal ?06/30/2016: 14/0.92 ?02/25/2016: 16/0.92, EGFR 60/73. At that time, glucose was 259, with a sodium of 133.   ? ?He has a history of elevated ACR ?02/07/2019: ACR 19.8 ?02/07/2020: ACR 39.5 ?Lab Results  ?Component Value Date  ? MICRALBCREAT 1.7 09/30/2018  ?05/28/2015: Urine ACR 45.6 ?On olmesartan. ? ?+ HL;last set of lipids: ?02/06/2021: 129/156/46/57 ?02/07/2020: 120/155/32/61 ?01/11/2018:  168/156/38/98 ?07/30/2017: 154/240/32/74 ?No results found for: CHOL,  HDL, LDLCALC, LDLDIRECT, TRIG, CHOLHDL ?06/30/2016: 163/186/33/92 ?05/28/2015: 170/173/34/102 ?She started the Pravastatin 10  In 01/2018. ? ?- last eye exam was in 05/2021: reportedly No DR, + macular degeneration.  She had laser surgery OS.  Dr. Thana Ates. ? ?- no numbness and tingling in her feet.  Saw podiatry in 05/2021 for an melanonychia  -considered to be due to the hydroxyurea. ? ?UTD with flu vaccine. ? ?ROS: ?+ see HPI ? ?I reviewed pt's medications, allergies, PMH, social hx, family hx, and changes were documented in the  history of present illness. Otherwise, unchanged from my initial visit note. ? ?Past Medical History:  ?Diagnosis Date  ? Diabetes mellitus without complication ([place])   ? Hypertension   ? ?Past Surgical History:  ?Procedure Laterality Date  ? ABDOMINAL HYSTERECTOMY    ? knee surgery  04/2013  ? ?Social History  ? ?Social History  ? Marital status: Married  ?  Spouse name: N/A  ? Number of children: 3  ? ?Occupational History  ? Retired Tourist information centre manager  ? ?Social History Main Topics  ? Smoking status: Never Smoker  ? Smokeless tobacco: No  ? Alcohol use No  ? Drug use: no  ? ?Current Outpatient Medications on File Prior to Visit  ?Medication Sig Dispense Refill  ? allopurinol (ZYLOPRIM) 100 MG tablet Take 200 mg by mouth at bedtime.     ? amLODipine (NORVASC) 10 MG tablet Take 10 mg by mouth daily. Unknown dose    ? atenolol (TENORMIN) 50 MG tablet Take 50 mg by mouth daily. Unknown dose    ? clonazePAM (KLONOPIN) 0.5 MG tablet Take 0.5 mg by mouth daily as needed.    ? Dulaglutide (TRULICITY) 1.5 ON/6.2XB SOPN Inject 1.5 mg into the skin once a week. 2 mL 11  ? estradiol (VIVELLE-DOT) 0.075 MG/24HR Place 1 patch onto the skin 2 (two) times a week.    ? fluticasone (FLONASE) 50 MCG/ACT nasal spray  (Patient not taking: Reported on 10/02/2021)    ? glimepiride (AMARYL) 4 MG tablet TAKE 1 TABLET BEFORE BREAKFAST AND 1/2 TABLET BEFORE DINNER 135 tablet 1  ? glucose blood (FREESTYLE TEST STRIPS) test strip Use 2x a day 100 each 11  ? hydroxyurea (HYDREA) 500 MG capsule TAKE 1 CAPSULE BY MOUTH ONCE DAILY ,ALTERNATING WITH 2 DAILY .MAY TAKE WITH FOOD TO MINIMIZE GI SIDE EFFECTS. 90 capsule 1  ? metFORMIN (GLUCOPHAGE-XR) 500 MG 24 hr tablet TAKE 1 TABLET BY MOUTH 2 TIMES DAILY WITH A MEAL. 180 tablet 3  ? montelukast (SINGULAIR) 10 MG tablet Take 10 mg by mouth at bedtime. Unknown dose (Patient not taking: Reported on 10/02/2021)    ? olmesartan (BENICAR) 20 MG tablet Take 20 mg by mouth daily.  0  ? pantoprazole (PROTONIX) 20 MG  tablet Take 20 mg by mouth daily.    ? pravastatin (PRAVACHOL) 10 MG tablet Take 1 tablet (10 mg total) by mouth daily. 90 tablet 3  ? ?No current facility-administered medications on file prior to visit.  ? ?Allergies  ?Allergen Reactions  ? Hydrocodone Itching  ? Sulfa Antibiotics   ?  ?FH: ?- Diabetes in mother, sister, brother ?- Hypertension in mother and sister ?- Hyperlipidemia in brother ? ?PE: ?BP 120/68 (BP Location: Right Arm, Patient Position: Sitting, Cuff Size: Normal)   Pulse 70   Ht 5' 2"  (1.575 m)   Wt 157 lb 9.6 oz (71.5 kg)   SpO2 99%   BMI 28.83 kg/m?  ?Wt Readings from Last  3 Encounters:  ?10/07/21 157 lb 9.6 oz (71.5 kg)  ?10/02/21 155 lb 8 oz (70.5 kg)  ?08/05/21 158 lb 9.6 oz (71.9 kg)  ? ?Constitutional: normal weight, in NAD ?Eyes: PERRLA, EOMI, no exophthalmos ?ENT: moist mucous membranes, no thyromegaly, no cervical lymphadenopathy ?Cardiovascular: RRR, No MRG ?Respiratory: CTA B. ?Musculoskeletal: no deformities, strength intact in all 4 ?Skin: moist, warm, no rashes ?Neurological: no tremor with outstretched hands, DTR normal in all 4 ? ?ASSESSMENT: ?1. DM2, non-insulin-dependent, uncontrolled, with complications ?- microalbuminuria ? ?2. HL ? ?3. MAU ? ?PLAN:  ?1. Patient with longstanding, previously uncontrolled type 2 diabetes, with improvement in control after adding Trulicity to her oral regimen with metformin and sulfonylurea.  At last visit, sugars were still higher than goal so we increased Trulicity dose and I also advised her to possibly increase Amaryl before a larger meal.  At that time HbA1c was better, at 6.5%, however, this could have been influenced by her hydroxyurea treatment. ?-At today's visit, sugars are at goal in the morning, improved from before and they are also at goal at that time.  Before dinner, they are above target, but they are not completely fasting.  For now, we will continue the current regimen including the lower dose of Trulicity than  recommended at last visit that she could not find a higher dose due to national shortage. ?-We did discuss that with larger meal, she can increase the dose of Amaryl that she is taking at night (as she is not doing so

## 2021-10-24 ENCOUNTER — Other Ambulatory Visit: Payer: Self-pay | Admitting: Internal Medicine

## 2021-10-24 DIAGNOSIS — D473 Essential (hemorrhagic) thrombocythemia: Secondary | ICD-10-CM

## 2021-11-04 ENCOUNTER — Other Ambulatory Visit: Payer: Self-pay | Admitting: Internal Medicine

## 2021-11-04 DIAGNOSIS — E119 Type 2 diabetes mellitus without complications: Secondary | ICD-10-CM | POA: Diagnosis not present

## 2021-11-04 DIAGNOSIS — H524 Presbyopia: Secondary | ICD-10-CM | POA: Diagnosis not present

## 2021-11-04 DIAGNOSIS — H25013 Cortical age-related cataract, bilateral: Secondary | ICD-10-CM | POA: Diagnosis not present

## 2021-11-26 DIAGNOSIS — H31093 Other chorioretinal scars, bilateral: Secondary | ICD-10-CM | POA: Diagnosis not present

## 2021-11-26 DIAGNOSIS — H43391 Other vitreous opacities, right eye: Secondary | ICD-10-CM | POA: Diagnosis not present

## 2021-11-26 DIAGNOSIS — H35053 Retinal neovascularization, unspecified, bilateral: Secondary | ICD-10-CM | POA: Diagnosis not present

## 2021-11-26 DIAGNOSIS — H35363 Drusen (degenerative) of macula, bilateral: Secondary | ICD-10-CM | POA: Diagnosis not present

## 2021-11-26 DIAGNOSIS — H3562 Retinal hemorrhage, left eye: Secondary | ICD-10-CM | POA: Diagnosis not present

## 2021-11-26 DIAGNOSIS — H43821 Vitreomacular adhesion, right eye: Secondary | ICD-10-CM | POA: Diagnosis not present

## 2021-11-26 DIAGNOSIS — H35371 Puckering of macula, right eye: Secondary | ICD-10-CM | POA: Diagnosis not present

## 2021-11-27 ENCOUNTER — Inpatient Hospital Stay: Payer: Medicare PPO | Admitting: Internal Medicine

## 2021-11-27 ENCOUNTER — Inpatient Hospital Stay: Payer: Medicare PPO | Attending: Internal Medicine

## 2021-11-27 ENCOUNTER — Other Ambulatory Visit: Payer: Self-pay

## 2021-11-27 VITALS — BP 141/55 | HR 73 | Temp 97.9°F | Resp 16 | Wt 157.0 lb

## 2021-11-27 DIAGNOSIS — D649 Anemia, unspecified: Secondary | ICD-10-CM | POA: Diagnosis not present

## 2021-11-27 DIAGNOSIS — Z5111 Encounter for antineoplastic chemotherapy: Secondary | ICD-10-CM

## 2021-11-27 DIAGNOSIS — D473 Essential (hemorrhagic) thrombocythemia: Secondary | ICD-10-CM | POA: Diagnosis not present

## 2021-11-27 DIAGNOSIS — E119 Type 2 diabetes mellitus without complications: Secondary | ICD-10-CM | POA: Diagnosis not present

## 2021-11-27 DIAGNOSIS — I10 Essential (primary) hypertension: Secondary | ICD-10-CM | POA: Diagnosis not present

## 2021-11-27 DIAGNOSIS — D471 Chronic myeloproliferative disease: Secondary | ICD-10-CM | POA: Diagnosis not present

## 2021-11-27 LAB — CMP (CANCER CENTER ONLY)
ALT: 16 U/L (ref 0–44)
AST: 19 U/L (ref 15–41)
Albumin: 4.2 g/dL (ref 3.5–5.0)
Alkaline Phosphatase: 64 U/L (ref 38–126)
Anion gap: 10 (ref 5–15)
BUN: 13 mg/dL (ref 8–23)
CO2: 27 mmol/L (ref 22–32)
Calcium: 9 mg/dL (ref 8.9–10.3)
Chloride: 103 mmol/L (ref 98–111)
Creatinine: 1.17 mg/dL — ABNORMAL HIGH (ref 0.44–1.00)
GFR, Estimated: 49 mL/min — ABNORMAL LOW (ref >60)
Glucose, Bld: 141 mg/dL — ABNORMAL HIGH (ref 70–99)
Potassium: 3.7 mmol/L (ref 3.5–5.1)
Sodium: 140 mmol/L (ref 135–145)
Total Bilirubin: 0.7 mg/dL (ref 0.3–1.2)
Total Protein: 8.2 g/dL — ABNORMAL HIGH (ref 6.5–8.1)

## 2021-11-27 LAB — CBC WITH DIFFERENTIAL (CANCER CENTER ONLY)
Abs Immature Granulocytes: 0.03 10*3/uL (ref 0.00–0.07)
Basophils Absolute: 0.1 10*3/uL (ref 0.0–0.1)
Basophils Relative: 1 %
Eosinophils Absolute: 0.1 10*3/uL (ref 0.0–0.5)
Eosinophils Relative: 1 %
HCT: 30.9 % — ABNORMAL LOW (ref 36.0–46.0)
Hemoglobin: 10.3 g/dL — ABNORMAL LOW (ref 12.0–15.0)
Immature Granulocytes: 0 %
Lymphocytes Relative: 27 %
Lymphs Abs: 2.2 10*3/uL (ref 0.7–4.0)
MCH: 31.1 pg (ref 26.0–34.0)
MCHC: 33.3 g/dL (ref 30.0–36.0)
MCV: 93.4 fL (ref 80.0–100.0)
Monocytes Absolute: 0.4 10*3/uL (ref 0.1–1.0)
Monocytes Relative: 5 %
Neutro Abs: 5.1 10*3/uL (ref 1.7–7.7)
Neutrophils Relative %: 66 %
Platelet Count: 376 10*3/uL (ref 150–400)
RBC: 3.31 MIL/uL — ABNORMAL LOW (ref 3.87–5.11)
RDW: 13.8 % (ref 11.5–15.5)
WBC Count: 7.8 10*3/uL (ref 4.0–10.5)
nRBC: 0 % (ref 0.0–0.2)

## 2021-11-27 LAB — LACTATE DEHYDROGENASE: LDH: 145 U/L (ref 98–192)

## 2021-11-27 NOTE — Progress Notes (Signed)
?    [facility] ?[phone]   [phone] ? ?OFFICE PROGRESS NOTE ? ?[name], MD ?[address] ?[place] [address] ? ?DIAGNOSIS: Essential thrombocythemia diagnosed in March 2022. ? ?PRIOR THERAPY: None ? ?CURRENT THERAPY: Hydroxyurea 500 mg p.o. daily.  Started October 23, 2020.  Starting July 14 she is on hydroxyurea 1000 mg alternating with 500 mg every other day. ? ?INTERVAL HISTORY: ?Ann [name] 76 y.o. female returns to the clinic today for follow-up visit.  The patient is feeling fine today with no concerning complaints.  She denied having any fatigue or weakness.  She denied having any bleeding, bruises or ecchymosis.  She has no nausea, vomiting, diarrhea or constipation.  She has no chest pain, shortness of breath, cough or hemoptysis.  She has been tolerating her treatment with hydroxyurea fairly well.  She is here today for evaluation and repeat blood work. ? ? ? ?MEDICAL HISTORY: ?Past Medical History:  ?Diagnosis Date  ? Diabetes mellitus without complication ([place])   ? Hypertension   ? ? ?ALLERGIES:  is allergic to hydrocodone and sulfa antibiotics. ? ?MEDICATIONS:  ?Current Outpatient Medications  ?Medication Sig Dispense Refill  ? allopurinol (ZYLOPRIM) 100 MG tablet Take 200 mg by mouth at bedtime.     ? amLODipine (NORVASC) 10 MG tablet Take 10 mg by mouth daily. Unknown dose    ? atenolol (TENORMIN) 50 MG tablet Take 50 mg by mouth daily. Unknown dose    ? clonazePAM (KLONOPIN) 0.5 MG tablet Take 0.5 mg by mouth daily as needed.    ? Dulaglutide (TRULICITY) 1.5 FH/5.4TG SOPN Inject 1.5 mg into the skin once a week. 2 mL 11  ? estradiol (VIVELLE-DOT) 0.075 MG/24HR Place 1 patch onto the skin 2 (two) times a week.    ? fluticasone (FLONASE) 50 MCG/ACT nasal spray     ? glimepiride (AMARYL) 4 MG tablet TAKE 1 TABLET BEFORE BREAKFAST AND 1/2 TABLET BEFORE DINNER 135 tablet 1  ? glucose blood (FREESTYLE TEST STRIPS) test strip Use 2x  a day 100 each 11  ? hydroxyurea (HYDREA) 500 MG capsule TAKE 1 CAPSULE (500 MG TOTAL) BY MOUTH DAILY. MAY TAKE WITH FOOD TO MINIMIZE GI SIDE EFFECTS. 90 capsule 1  ? metFORMIN (GLUCOPHAGE-XR) 500 MG 24 hr tablet TAKE 1 TABLET BY MOUTH 2 TIMES DAILY WITH A MEAL. 180 tablet 3  ? montelukast (SINGULAIR) 10 MG tablet Take 10 mg by mouth at bedtime. Unknown dose    ? olmesartan (BENICAR) 20 MG tablet Take 20 mg by mouth daily.  0  ? pantoprazole (PROTONIX) 20 MG tablet Take 20 mg by mouth daily.    ? pravastatin (PRAVACHOL) 10 MG tablet Take 1 tablet (10 mg total) by mouth daily. 90 tablet 3  ? ?No current facility-administered medications for this visit.  ? ? ?SURGICAL HISTORY:  ?Past Surgical History:  ?Procedure Laterality Date  ? ABDOMINAL HYSTERECTOMY    ? knee surgery  04/2013  ? ? ?REVIEW OF SYSTEMS:  A comprehensive review of systems was negative.  ? ?PHYSICAL EXAMINATION: General appearance: alert, cooperative, and no distress ?Head: Normocephalic, without obvious abnormality, atraumatic ?Neck: no adenopathy, no JVD, supple, symmetrical, trachea midline, and thyroid not enlarged, symmetric, no tenderness/mass/nodules ?Lymph nodes: Cervical, supraclavicular, and axillary nodes normal. ?Resp: clear to auscultation bilaterally ?Back: symmetric, no curvature. ROM normal. No CVA tenderness. ?Cardio: regular rate and rhythm, S1, S2 normal, no murmur, click, rub or gallop ?GI: soft, non-tender; bowel  sounds normal; no masses,  no organomegaly ?Extremities: extremities normal, atraumatic, no cyanosis or edema ? ?ECOG PERFORMANCE STATUS: 0 - Asymptomatic ? ?Blood pressure (!) 141/55, pulse 73, temperature 97.9 ?F (36.6 ?C), temperature source Tympanic, resp. rate 16, weight 157 lb (71.2 kg), SpO2 100 %. ? ?LABORATORY DATA: ?Lab Results  ?Component Value Date  ? WBC 7.8 11/27/2021  ? HGB 10.3 (L) 11/27/2021  ? HCT 30.9 (L) 11/27/2021  ? MCV 93.4 11/27/2021  ? PLT 376 11/27/2021  ? ? ?  Chemistry   ?   ?Component Value  Date/Time  ? NA 139 10/02/2021 0914  ? K 3.6 10/02/2021 0914  ? CL 104 10/02/2021 0914  ? CO2 26 10/02/2021 0914  ? BUN 17 10/02/2021 0914  ? CREATININE 1.04 (H) 10/02/2021 0914  ?    ?Component Value Date/Time  ? CALCIUM 9.4 10/02/2021 0914  ? ALKPHOS 63 10/02/2021 0914  ? AST 18 10/02/2021 0914  ? ALT 16 10/02/2021 0914  ? BILITOT 0.7 10/02/2021 0914  ?  ? ? ? ?RADIOGRAPHIC STUDIES: ?No results found. ? ?ASSESSMENT AND PLAN: This is a very pleasant 76 years old African-American female diagnosed with essential thrombocythemia in March 2022 with positive JAK2 mutation. ?She is currently on treatment with hydroxyurea 1000 mg alternating with 500 mg every other day.  ?The patient has been tolerating this treatment fairly well with no concerning complaints except for the darkening of her skin. ?Repeat CBC today showed normal total white blood count as well as platelets count but the patient continues to have persistent mild anemia. ?I recommended for her to continue on the same dose of hydroxyurea. ?I will see her back for follow-up visit in 3 months for evaluation and repeat blood work. ?She was advised to call immediately if she has any other concerning symptoms in the interval. ?The patient voices understanding of current disease status and treatment options and is in agreement with the current care plan. ? ?All questions were answered. The patient knows to call the clinic with any problems, questions or concerns. We can certainly see the patient much sooner if necessary. ? ?Disclaimer: This note was dictated with voice recognition software. Similar sounding words can inadvertently be transcribed and may not be corrected upon review. ? ? ?  ?  ?

## 2021-12-04 ENCOUNTER — Other Ambulatory Visit: Payer: Medicare PPO

## 2021-12-04 ENCOUNTER — Ambulatory Visit: Payer: Medicare PPO | Admitting: Internal Medicine

## 2021-12-12 ENCOUNTER — Other Ambulatory Visit: Payer: Self-pay | Admitting: Internal Medicine

## 2022-01-20 ENCOUNTER — Telehealth: Payer: Self-pay | Admitting: Internal Medicine

## 2022-02-12 ENCOUNTER — Ambulatory Visit: Payer: Medicare PPO | Admitting: Internal Medicine

## 2022-02-12 ENCOUNTER — Encounter: Payer: Self-pay | Admitting: Internal Medicine

## 2022-02-12 VITALS — BP 128/68 | HR 79 | Ht 62.0 in | Wt 159.0 lb

## 2022-02-12 DIAGNOSIS — M109 Gout, unspecified: Secondary | ICD-10-CM | POA: Diagnosis not present

## 2022-02-12 DIAGNOSIS — E785 Hyperlipidemia, unspecified: Secondary | ICD-10-CM | POA: Diagnosis not present

## 2022-02-12 DIAGNOSIS — R809 Proteinuria, unspecified: Secondary | ICD-10-CM

## 2022-02-12 DIAGNOSIS — Z0000 Encounter for general adult medical examination without abnormal findings: Secondary | ICD-10-CM | POA: Diagnosis not present

## 2022-02-12 DIAGNOSIS — D473 Essential (hemorrhagic) thrombocythemia: Secondary | ICD-10-CM | POA: Diagnosis not present

## 2022-02-12 DIAGNOSIS — E1165 Type 2 diabetes mellitus with hyperglycemia: Secondary | ICD-10-CM | POA: Diagnosis not present

## 2022-02-12 DIAGNOSIS — I10 Essential (primary) hypertension: Secondary | ICD-10-CM | POA: Diagnosis not present

## 2022-02-12 DIAGNOSIS — E1129 Type 2 diabetes mellitus with other diabetic kidney complication: Secondary | ICD-10-CM | POA: Diagnosis not present

## 2022-02-12 LAB — POCT GLYCOSYLATED HEMOGLOBIN (HGB A1C): Hemoglobin A1C: 6.4 % — AB (ref 4.0–5.6)

## 2022-02-12 MED ORDER — METFORMIN HCL ER 500 MG PO TB24: 500.0000 mg | ORAL_TABLET | Freq: Every day | ORAL | 3 refills | Status: DC

## 2022-02-12 NOTE — Progress Notes (Signed)
Patient ID: Ann [name], female   DOB: 1945-10-11, 76 y.o.   MRN: [id number]   HPI: Ann [name] is a 76 y.o.-year-old female, initially referred by Dr. Orland Mustard, now returning for f/u for DM2, dx in 2006, non-insulin-dependent, uncontrolled, with complications (microalbuminuria). Last visit 4 months ago. PCP: DR. [name].  Interim history: No increased urination, blurry vision, nausea, chest pain.  However, at this visit, she mentions diarrhea from metformin. She has a lot of stress before last OV  - husband had a stroke - he recovered well.  Reviewed HbA1c levels: Lab Results  Component Value Date   HGBA1C 6.3 (A) 10/07/2021   HGBA1C 6.5 (A) 06/09/2021   HGBA1C 7.0 (A) 02/10/2021   HGBA1C 7.1 (A) 10/17/2020   HGBA1C 7.1 (A) 06/13/2020   HGBA1C 6.9 (A) 02/08/2020   HGBA1C 7.3 02/07/2020   HGBA1C 7.1 (A) 10/05/2019   HGBA1C 7.1 (A) 06/16/2019   HGBA1C 6.7 (A) 02/03/2019   HGBA1C 7.3 (A) 09/30/2018   HGBA1C 6.5 (A) 06/06/2018   HGBA1C 7.9 (A) 03/15/2018   HGBA1C 6.7 10/07/2017   HGBA1C 6.7 06/03/2017  02/25/2016: 11.2% Previously 9.7% Previously 8.9%  Pt is on a regimen of: - Metformin ER 500 mg 2x a day >> diarrhea - Amaryl 4 mg in a.m. and 4 mg before dinner - Trulicity 1.5 >> 3 >> 1.5 mg weekly  She was on Metformin 1000 mg 2x a day >> diarrhea >> but resolved. She refused insulin.  Pt checks her sugars 0 to once a day: - am: 90, 120-140 >> 126-156, 179 (cupcakes) >> 120-130 >> 124-134 - 2h after b'fast: n/c - before lunch: n/c >> 136 (sick) >> n/c - 2h after lunch: n/c - before dinner: n/c >> 150-165 >> 150s >> 142-156 >> n/c - 2h after dinner: 130s >> n/c >> 165-187 >> <160 >> n/c - bedtime: 130s-160s, 201 >> n/c >> 150s-160 >> 152-160s - nighttime: n/c >> 143-179 >> n/c Lowest sugar was 119 >> 120 >> 90 >> 120; she has hypoglycemia awareness in the 70s. Highest sugar was 201 >> 187 >> 179 >> 166. We tried a Dexcom CGM for her before but she is on  hydroxyurea for essential thrombocytopenia and she had erratic values on it.  Glucometer: Freestyle Lite  Pt's meals are: - Breakfast: cereal, boiled egg, juice - Lunch: sandwich, salad - Dinner: meat + salad, potato - Snacks: 3 She saw nutrition in the past.  No CKD, last BUN/creatinine:  Lab Results  Component Value Date   BUN 13 11/27/2021   Lab Results  Component Value Date   CREATININE 1.17 (H) 11/27/2021  02/06/2021: 21/0.98, GFR 60, glucose 99 02/07/2020: BUN/creatinine 12/0.98 06/05/2019: BUN/creatinine 11/0.91, glucose 128, Calcium normal at 9.6 01/11/2018: CMP with a high glucose of 175.  BUN/creatinine normal 11/0.84, with the rest of the CMP being normal 06/30/2016: 14/0.92 02/25/2016: 16/0.92, EGFR 60/73. At that time, glucose was 259, with a sodium of 133.    He has a history of elevated ACR 02/12/2022: ACR pending 02/07/2019: ACR 19.8 02/07/2020: ACR 39.5 Lab Results  Component Value Date   MICRALBCREAT 1.7 09/30/2018  05/28/2015: Urine ACR 45.6 On olmesartan.  + HL;last set of lipids: 02/12/2022: Lipids pending 02/06/2021: 129/156/46/57 02/07/2020: 120/155/32/61 01/11/2018:  168/156/38/98 07/30/2017: 154/240/32/74 No results found for: "CHOL", "HDL", "LDLCALC", "LDLDIRECT", "TRIG", "CHOLHDL" 06/30/2016: 163/186/33/92 05/28/2015: 170/173/34/102 She started the Pravastatin 10  In 01/2018.  - last eye exam was on 11/04/2021: No DR, + macular degeneration.  She  had laser surgery OS.  Dr. Thana Ates.  - no numbness and tingling in her feet.  Saw podiatry in 05/2021 for an melanonychia  -considered to be due to the hydroxyurea.  Last foot exam 05/2021.  UTD with flu vaccine.  ROS: + see HPI  I reviewed pt's medications, allergies, PMH, social hx, family hx, and changes were documented in the history of present illness. Otherwise, unchanged from my initial visit note.  Past Medical History:  Diagnosis Date   Diabetes mellitus without complication ([place])     Hypertension    Past Surgical History:  Procedure Laterality Date   ABDOMINAL HYSTERECTOMY     knee surgery  04/2013   Social History   Social History   Marital status: Married    Spouse name: N/A   Number of children: 3   Occupational History   Retired [employer] information centre manager   Social History Main Topics   Smoking status: Never Smoker   Smokeless tobacco: No   Alcohol use No   Drug use: no   Current Outpatient Medications on File Prior to Visit  Medication Sig Dispense Refill   allopurinol (ZYLOPRIM) 100 MG tablet Take 200 mg by mouth at bedtime.      amLODipine (NORVASC) 10 MG tablet TAKE 1 TABLET BY MOUTH EVERY DAY 90 tablet 2   atenolol (TENORMIN) 50 MG tablet Take 50 mg by mouth daily. Unknown dose     clonazePAM (KLONOPIN) 0.5 MG tablet Take 0.5 mg by mouth daily as needed.     Dulaglutide (TRULICITY) 1.5 XH/3.7JI SOPN Inject 1.5 mg into the skin once a week. 2 mL 11   estradiol (VIVELLE-DOT) 0.075 MG/24HR Place 1 patch onto the skin 2 (two) times a week.     fluticasone (FLONASE) 50 MCG/ACT nasal spray      glimepiride (AMARYL) 4 MG tablet TAKE 1 TABLET BEFORE BREAKFAST AND 1/2 TABLET BEFORE DINNER 135 tablet 1   glucose blood (FREESTYLE TEST STRIPS) test strip Use 2x a day 100 each 11   hydroxyurea (HYDREA) 500 MG capsule TAKE 1 CAPSULE (500 MG TOTAL) BY MOUTH DAILY. MAY TAKE WITH FOOD TO MINIMIZE GI SIDE EFFECTS. 90 capsule 1   metFORMIN (GLUCOPHAGE-XR) 500 MG 24 hr tablet TAKE 1 TABLET BY MOUTH 2 TIMES DAILY WITH A MEAL. 180 tablet 3   montelukast (SINGULAIR) 10 MG tablet Take 10 mg by mouth at bedtime. Unknown dose     olmesartan (BENICAR) 20 MG tablet Take 20 mg by mouth daily.  0   pantoprazole (PROTONIX) 20 MG tablet Take 20 mg by mouth daily.     pravastatin (PRAVACHOL) 10 MG tablet Take 1 tablet (10 mg total) by mouth daily. 90 tablet 3   No current facility-administered medications on file prior to visit.   Allergies  Allergen Reactions   Hydrocodone Itching   Sulfa  Antibiotics     FH: - Diabetes in mother, sister, brother - Hypertension in mother and sister - Hyperlipidemia in brother  PE: BP 128/68 (BP Location: Right Arm, Patient Position: Sitting, Cuff Size: Normal)   Pulse 79   Ht 5' 2"  (1.575 m)   Wt 159 lb (72.1 kg)   SpO2 99%   BMI 29.08 kg/m  Wt Readings from Last 3 Encounters:  02/12/22 159 lb (72.1 kg)  11/27/21 157 lb (71.2 kg)  10/07/21 157 lb 9.6 oz (71.5 kg)   Constitutional: normal weight, in NAD Eyes:  EOMI, no exophthalmos ENT: moist mucous membranes, no thyromegaly, no cervical lymphadenopathy Cardiovascular:  RRR, No MRG Respiratory: CTA B Musculoskeletal: no deformities Skin: moist, warm, no rashes Neurological: no tremor with outstretched hands  ASSESSMENT: 1. DM2, non-insulin-dependent, uncontrolled, with complications - microalbuminuria  2. HL  3. MAU  PLAN:  1. Patient with longstanding, previously uncontrolled type 2 diabetes, with improvement in control after adding Trulicity to her oral regimen with metformin and sulfonylurea.  At last visit, sugars were at goal in the morning, improved, and they were also at goal at that time.  Before dinner, they were above target but upon questioning, these were not completely fasting.  We did not change the regimen.  We could not increase Trulicity dose at that time due to [employer].  I did advise her that with a larger dinner, she can increase the dose of Amaryl before this meal.  HbA1c at last visit was 6.3%, lower. -At today's visit, sugars are at or slightly above goal in the and they remain at goal later in the day.  This is despite her forgetting that she could take a higher dose of Amaryl before dinner if she eats a larger meal.  I again emphasized this.  I suspect that this can improve her morning sugars. -She complains of diarrhea with metformin and is interested in reducing the dose.  I advised her to only take 1 tablet the day, with dinner.  We will  continue the rest of the medications.  If sugars increase, we may be able to increase the dose of Trulicity   - I suggested to:  Patient Instructions  Please try: - Metformin ER 500 mg 1x a day with dinner  Continue: - Amaryl 4 mg in am and 2-4 mg before dinner - Trulicity 1.5 mg weekly   Please return in 4 months with your sugar log.   - we checked her HbA1c: 6.4%  - advised to check sugars at different times of the day - 1x a day, rotating check times - advised for yearly eye exams >> she is UTD - return to clinic in 4 months  2. HL -Reviewed latest lipid panel from 01/2021: Fractions at goal with the exception of a slightly high triglyceride level -She continues on pravastatin 10 mg daily without side effects -She had another lipid panel with PCP today -results pending  3. MAU -She has a history of microalbuminuria with previous level being slightly high, at 39.5, but she had a normal level, at 19.8 in 01/2021 -She continues on olmesartan 20 mg daily  Philemon Kingdom, MD PhD [facility]

## 2022-02-12 NOTE — Patient Instructions (Addendum)
Please try: - Metformin ER 500 mg 1x a day with dinner  Continue: - Amaryl 4 mg in am and 2-4 mg before dinner - Trulicity 1.5 mg weekly   Please return in 4 months with your sugar log.

## 2022-02-26 ENCOUNTER — Ambulatory Visit: Payer: Medicare PPO | Admitting: Internal Medicine

## 2022-02-26 ENCOUNTER — Inpatient Hospital Stay: Payer: Medicare PPO | Attending: Internal Medicine

## 2022-02-26 ENCOUNTER — Other Ambulatory Visit: Payer: Medicare PPO

## 2022-02-26 ENCOUNTER — Encounter: Payer: Self-pay | Admitting: Internal Medicine

## 2022-02-26 ENCOUNTER — Inpatient Hospital Stay: Payer: Medicare PPO | Admitting: Internal Medicine

## 2022-02-26 ENCOUNTER — Other Ambulatory Visit: Payer: Self-pay

## 2022-02-26 VITALS — BP 148/58 | HR 72 | Temp 97.7°F | Resp 15 | Wt 157.4 lb

## 2022-02-26 DIAGNOSIS — Z5111 Encounter for antineoplastic chemotherapy: Secondary | ICD-10-CM

## 2022-02-26 DIAGNOSIS — I10 Essential (primary) hypertension: Secondary | ICD-10-CM | POA: Diagnosis not present

## 2022-02-26 DIAGNOSIS — D473 Essential (hemorrhagic) thrombocythemia: Secondary | ICD-10-CM

## 2022-02-26 DIAGNOSIS — E119 Type 2 diabetes mellitus without complications: Secondary | ICD-10-CM | POA: Insufficient documentation

## 2022-02-26 LAB — CMP (CANCER CENTER ONLY)
ALT: 19 U/L (ref 0–44)
AST: 19 U/L (ref 15–41)
Albumin: 4.4 g/dL (ref 3.5–5.0)
Alkaline Phosphatase: 67 U/L (ref 38–126)
Anion gap: 9 (ref 5–15)
BUN: 17 mg/dL (ref 8–23)
CO2: 27 mmol/L (ref 22–32)
Calcium: 9.2 mg/dL (ref 8.9–10.3)
Chloride: 103 mmol/L (ref 98–111)
Creatinine: 1.17 mg/dL — ABNORMAL HIGH (ref 0.44–1.00)
GFR, Estimated: 49 mL/min — ABNORMAL LOW (ref >60)
Glucose, Bld: 159 mg/dL — ABNORMAL HIGH (ref 70–99)
Potassium: 3.4 mmol/L — ABNORMAL LOW (ref 3.5–5.1)
Sodium: 139 mmol/L (ref 135–145)
Total Bilirubin: 0.7 mg/dL (ref 0.3–1.2)
Total Protein: 8.6 g/dL — ABNORMAL HIGH (ref 6.5–8.1)

## 2022-02-26 LAB — CBC WITH DIFFERENTIAL (CANCER CENTER ONLY)
Abs Immature Granulocytes: 0.02 10*3/uL (ref 0.00–0.07)
Basophils Absolute: 0.1 10*3/uL (ref 0.0–0.1)
Basophils Relative: 1 %
Eosinophils Absolute: 0.1 10*3/uL (ref 0.0–0.5)
Eosinophils Relative: 1 %
HCT: 32.8 % — ABNORMAL LOW (ref 36.0–46.0)
Hemoglobin: 10.8 g/dL — ABNORMAL LOW (ref 12.0–15.0)
Immature Granulocytes: 0 %
Lymphocytes Relative: 25 %
Lymphs Abs: 1.9 10*3/uL (ref 0.7–4.0)
MCH: 30.3 pg (ref 26.0–34.0)
MCHC: 32.9 g/dL (ref 30.0–36.0)
MCV: 92.1 fL (ref 80.0–100.0)
Monocytes Absolute: 0.4 10*3/uL (ref 0.1–1.0)
Monocytes Relative: 5 %
Neutro Abs: 5.3 10*3/uL (ref 1.7–7.7)
Neutrophils Relative %: 68 %
Platelet Count: 364 10*3/uL (ref 150–400)
RBC: 3.56 MIL/uL — ABNORMAL LOW (ref 3.87–5.11)
RDW: 14.1 % (ref 11.5–15.5)
WBC Count: 7.7 10*3/uL (ref 4.0–10.5)
nRBC: 0 % (ref 0.0–0.2)

## 2022-02-26 LAB — LACTATE DEHYDROGENASE: LDH: 137 U/L (ref 98–192)

## 2022-02-26 NOTE — Progress Notes (Signed)
[facility] [phone]   Fax:(336) [phone]  OFFICE PROGRESS NOTE  [name], MD [address] [place] [address]  DIAGNOSIS: Essential thrombocythemia diagnosed in March 2022.  PRIOR THERAPY: None  CURRENT THERAPY: Hydroxyurea 500 mg p.o. daily.  Started October 23, 2020.  Starting July 14 she is on hydroxyurea 1000 mg alternating with 500 mg every other day.  INTERVAL HISTORY: Ann [name] 76 y.o. female returns to the clinic today for 63-monthfollow-up visit.  The patient is feeling fine today with no concerning complaints.  She denied having any fatigue or weakness.  She has no current chest pain, shortness of breath, cough or hemoptysis.  She has no nausea, vomiting, diarrhea or constipation.  She has no headache or visual changes.  She continues to tolerate her treatment with hydroxyurea fairly well.  She is here today for evaluation and repeat blood work.   MEDICAL HISTORY: Past Medical History:  Diagnosis Date   Diabetes mellitus without complication ([place]    Hypertension     ALLERGIES:  is allergic to hydrocodone and sulfa antibiotics.  MEDICATIONS:  Current Outpatient Medications  Medication Sig Dispense Refill   hydroxyurea (HYDREA) 500 MG capsule TAKE 1 CAPSULE (500 MG TOTAL) BY MOUTH DAILY. MAY TAKE WITH FOOD TO MINIMIZE GI SIDE EFFECTS. 90 capsule 1   allopurinol (ZYLOPRIM) 100 MG tablet Take 200 mg by mouth at bedtime.      amLODipine (NORVASC) 10 MG tablet TAKE 1 TABLET BY MOUTH EVERY DAY 90 tablet 2   atenolol (TENORMIN) 50 MG tablet Take 50 mg by mouth daily. Unknown dose     clonazePAM (KLONOPIN) 0.5 MG tablet Take 0.5 mg by mouth daily as needed.     Dulaglutide (TRULICITY) 1.5 MPR/9.1MBSOPN Inject 1.5 mg into the skin once a week. 2 mL 11   estradiol (VIVELLE-DOT) 0.075 MG/24HR Place 1 patch onto the skin 2 (two) times a week.     fluticasone (FLONASE) 50 MCG/ACT nasal spray      glimepiride  (AMARYL) 4 MG tablet TAKE 1 TABLET BEFORE BREAKFAST AND 1/2 TABLET BEFORE DINNER 135 tablet 1   glucose blood (FREESTYLE TEST STRIPS) test strip Use 2x a day 100 each 11   metFORMIN (GLUCOPHAGE-XR) 500 MG 24 hr tablet Take 1 tablet (500 mg total) by mouth daily with supper. 90 tablet 3   montelukast (SINGULAIR) 10 MG tablet Take 10 mg by mouth at bedtime. Unknown dose     olmesartan (BENICAR) 20 MG tablet Take 20 mg by mouth daily.  0   pantoprazole (PROTONIX) 20 MG tablet Take 20 mg by mouth daily.     pravastatin (PRAVACHOL) 10 MG tablet Take 1 tablet (10 mg total) by mouth daily. 90 tablet 3   No current facility-administered medications for this visit.    SURGICAL HISTORY:  Past Surgical History:  Procedure Laterality Date   ABDOMINAL HYSTERECTOMY     knee surgery  04/2013    REVIEW OF SYSTEMS:  A comprehensive review of systems was negative.   PHYSICAL EXAMINATION: General appearance: alert, cooperative, and no distress Head: Normocephalic, without obvious abnormality, atraumatic Neck: no adenopathy, no JVD, supple, symmetrical, trachea midline, and thyroid not enlarged, symmetric, no tenderness/mass/nodules Lymph nodes: Cervical, supraclavicular, and axillary nodes normal. Resp: clear to auscultation bilaterally Back: symmetric, no curvature. ROM normal. No CVA tenderness. Cardio: regular rate and rhythm, S1, S2 normal, no murmur, click, rub or gallop GI: soft, non-tender; bowel  sounds normal; no masses,  no organomegaly Extremities: extremities normal, atraumatic, no cyanosis or edema  ECOG PERFORMANCE STATUS: 0 - Asymptomatic  Blood pressure (!) 148/58, pulse 72, temperature 97.7 F (36.5 C), temperature source Oral, resp. rate 15, weight 157 lb 6.4 oz (71.4 kg), SpO2 100 %.  LABORATORY DATA: Lab Results  Component Value Date   WBC 7.7 02/26/2022   HGB 10.8 (L) 02/26/2022   HCT 32.8 (L) 02/26/2022   MCV 92.1 02/26/2022   PLT 364 02/26/2022      Chemistry       Component Value Date/Time   NA 140 11/27/2021 0843   K 3.7 11/27/2021 0843   CL 103 11/27/2021 0843   CO2 27 11/27/2021 0843   BUN 13 11/27/2021 0843   CREATININE 1.17 (H) 11/27/2021 0843      Component Value Date/Time   CALCIUM 9.0 11/27/2021 0843   ALKPHOS 64 11/27/2021 0843   AST 19 11/27/2021 0843   ALT 16 11/27/2021 0843   BILITOT 0.7 11/27/2021 0843       RADIOGRAPHIC STUDIES: No results found.  ASSESSMENT AND PLAN: This is a very pleasant 76 years old African-American female diagnosed with essential thrombocythemia in March 2022 with positive JAK2 mutation. She is currently on treatment with hydroxyurea 1000 mg alternating with 500 mg every other day.  She has been tolerating this dose of hydroxyurea fairly well with no concerning adverse effects. Repeat CBC today showed improvement of her hemoglobin up to 11.8 with normal platelets count as well as total white blood count. I recommended for the patient to continue on the same dose of hydroxyurea for now. I will see her back for follow-up visit in 3 months for evaluation and repeat CBC, comprehensive metabolic panel and LDH. The patient was advised to call immediately if she has any other concerning symptoms in the interval. The patient voices understanding of current disease status and treatment options and is in agreement with the current care plan.  All questions were answered. The patient knows to call the clinic with any problems, questions or concerns. We can certainly see the patient much sooner if necessary.  Disclaimer: This note was dictated with voice recognition software. Similar sounding words can inadvertently be transcribed and may not be corrected upon review.

## 2022-03-17 ENCOUNTER — Telehealth: Payer: Self-pay | Admitting: Internal Medicine

## 2022-03-17 NOTE — Telephone Encounter (Signed)
Called patient regarding upcoming November appointments, patient is notified. 

## 2022-04-10 ENCOUNTER — Other Ambulatory Visit: Payer: Self-pay | Admitting: Internal Medicine

## 2022-04-23 ENCOUNTER — Other Ambulatory Visit: Payer: Self-pay | Admitting: Internal Medicine

## 2022-05-08 DIAGNOSIS — Z23 Encounter for immunization: Secondary | ICD-10-CM | POA: Diagnosis not present

## 2022-05-12 ENCOUNTER — Other Ambulatory Visit: Payer: Self-pay | Admitting: Internal Medicine

## 2022-05-12 DIAGNOSIS — D473 Essential (hemorrhagic) thrombocythemia: Secondary | ICD-10-CM

## 2022-05-19 ENCOUNTER — Other Ambulatory Visit: Payer: Self-pay

## 2022-05-19 MED ORDER — COVID-19 MRNA 2023-2024 VACCINE (COMIRNATY) 0.3 ML INJECTION
0.3000 mL | Freq: Once | INTRAMUSCULAR | 0 refills | Status: AC
  Filled 2022-05-19: qty 0.3, 1d supply, fill #0

## 2022-05-27 ENCOUNTER — Other Ambulatory Visit: Payer: Medicare PPO

## 2022-05-27 ENCOUNTER — Ambulatory Visit: Payer: Medicare PPO | Admitting: Internal Medicine

## 2022-05-27 ENCOUNTER — Inpatient Hospital Stay: Payer: Medicare PPO | Attending: Internal Medicine

## 2022-05-27 ENCOUNTER — Inpatient Hospital Stay (HOSPITAL_BASED_OUTPATIENT_CLINIC_OR_DEPARTMENT_OTHER): Payer: Medicare PPO | Admitting: Internal Medicine

## 2022-05-27 ENCOUNTER — Other Ambulatory Visit: Payer: Self-pay

## 2022-05-27 VITALS — BP 144/68 | HR 72 | Temp 97.6°F | Resp 15 | Ht 62.0 in | Wt 158.4 lb

## 2022-05-27 DIAGNOSIS — D473 Essential (hemorrhagic) thrombocythemia: Secondary | ICD-10-CM

## 2022-05-27 DIAGNOSIS — I10 Essential (primary) hypertension: Secondary | ICD-10-CM | POA: Insufficient documentation

## 2022-05-27 DIAGNOSIS — E119 Type 2 diabetes mellitus without complications: Secondary | ICD-10-CM | POA: Insufficient documentation

## 2022-05-27 LAB — CBC WITH DIFFERENTIAL (CANCER CENTER ONLY)
Abs Immature Granulocytes: 0.03 10*3/uL (ref 0.00–0.07)
Basophils Absolute: 0.1 10*3/uL (ref 0.0–0.1)
Basophils Relative: 2 %
Eosinophils Absolute: 0.1 10*3/uL (ref 0.0–0.5)
Eosinophils Relative: 1 %
HCT: 36.3 % (ref 36.0–46.0)
Hemoglobin: 11.8 g/dL — ABNORMAL LOW (ref 12.0–15.0)
Immature Granulocytes: 0 %
Lymphocytes Relative: 24 %
Lymphs Abs: 2.1 10*3/uL (ref 0.7–4.0)
MCH: 29.5 pg (ref 26.0–34.0)
MCHC: 32.5 g/dL (ref 30.0–36.0)
MCV: 90.8 fL (ref 80.0–100.0)
Monocytes Absolute: 0.3 10*3/uL (ref 0.1–1.0)
Monocytes Relative: 4 %
Neutro Abs: 6 10*3/uL (ref 1.7–7.7)
Neutrophils Relative %: 69 %
Platelet Count: 394 10*3/uL (ref 150–400)
RBC: 4 MIL/uL (ref 3.87–5.11)
RDW: 14 % (ref 11.5–15.5)
WBC Count: 8.7 10*3/uL (ref 4.0–10.5)
nRBC: 0 % (ref 0.0–0.2)

## 2022-05-27 LAB — CMP (CANCER CENTER ONLY)
ALT: 18 U/L (ref 0–44)
AST: 24 U/L (ref 15–41)
Albumin: 4.2 g/dL (ref 3.5–5.0)
Alkaline Phosphatase: 65 U/L (ref 38–126)
Anion gap: 9 (ref 5–15)
BUN: 18 mg/dL (ref 8–23)
CO2: 26 mmol/L (ref 22–32)
Calcium: 9.3 mg/dL (ref 8.9–10.3)
Chloride: 104 mmol/L (ref 98–111)
Creatinine: 1.18 mg/dL — ABNORMAL HIGH (ref 0.44–1.00)
GFR, Estimated: 48 mL/min — ABNORMAL LOW (ref >60)
Glucose, Bld: 135 mg/dL — ABNORMAL HIGH (ref 70–99)
Potassium: 3.9 mmol/L (ref 3.5–5.1)
Sodium: 139 mmol/L (ref 135–145)
Total Bilirubin: 0.6 mg/dL (ref 0.3–1.2)
Total Protein: 8.8 g/dL — ABNORMAL HIGH (ref 6.5–8.1)

## 2022-05-27 LAB — LACTATE DEHYDROGENASE: LDH: 182 U/L (ref 98–192)

## 2022-05-27 NOTE — Progress Notes (Signed)
[place] [phone]) [phone]   Fax:([phone]  OFFICE PROGRESS NOTE  [name], MD [address] [place] [address]  DIAGNOSIS: Essential thrombocythemia diagnosed in March 2022.  PRIOR THERAPY: None  CURRENT THERAPY: Hydroxyurea 500 mg p.o. daily.  Started October 23, 2020.    INTERVAL HISTORY: Ann [name] 76 y.o. female returns to the clinic today for 4-monthfollow-up visit.  The patient is feeling fine today with no concerning complaints.  She denied having any chest pain, shortness of breath, cough or hemoptysis.  She continues to have intermittent numbness in her fingers especially at nighttime.  She has no nausea, vomiting, diarrhea or constipation.  She has no headache or visual changes.  She denied having any fever or chills.  The patient is here today for evaluation and repeat blood work.   MEDICAL HISTORY: Past Medical History:  Diagnosis Date   [place]    Hypertension     ALLERGIES:  is allergic to hydrocodone and sulfa antibiotics.  MEDICATIONS:  Current Outpatient Medications  Medication Sig Dispense Refill   allopurinol (ZYLOPRIM) 100 MG tablet Take 200 mg by mouth at bedtime.      amLODipine (NORVASC) 10 MG tablet TAKE 1 TABLET BY MOUTH EVERY DAY 90 tablet 2   atenolol (TENORMIN) 50 MG tablet Take 50 mg by mouth daily. Unknown dose     clonazePAM (KLONOPIN) 0.5 MG tablet Take 0.5 mg by mouth daily as needed.     estradiol (VIVELLE-DOT) 0.075 MG/24HR Place 1 patch onto the skin 2 (two) times a week.     fluticasone (FLONASE) 50 MCG/ACT nasal spray      glimepiride (AMARYL) 4 MG tablet TAKE 1 TABLET BEFORE BREAKFAST AND 1/2 TABLET BEFORE DINNER 135 tablet 1   glucose blood (FREESTYLE TEST STRIPS) test strip Use 2x a day 100 each 11   hydroxyurea (HYDREA) 500 MG capsule TAKE 1 CAPSULE (500 MG TOTAL) BY MOUTH DAILY. MAY TAKE WITH FOOD TO MINIMIZE GI SIDE EFFECTS. 90  capsule 1   metFORMIN (GLUCOPHAGE-XR) 500 MG 24 hr tablet TAKE 1 TABLET BY MOUTH 2 TIMES DAILY WITH A MEAL. 180 tablet 3   montelukast (SINGULAIR) 10 MG tablet Take 10 mg by mouth at bedtime. Unknown dose     olmesartan (BENICAR) 20 MG tablet Take 20 mg by mouth daily.  0   pantoprazole (PROTONIX) 20 MG tablet Take 20 mg by mouth daily.     pravastatin (PRAVACHOL) 10 MG tablet Take 1 tablet (10 mg total) by mouth daily. 90 tablet 3   TRULICITY 1.5 MNL/8.9QJSOPN INJECT 1.5 MG INTO THE SKIN ONCE A WEEK. 2 mL 3   No current facility-administered medications for this visit.    SURGICAL HISTORY:  Past Surgical History:  Procedure Laterality Date   ABDOMINAL HYSTERECTOMY     knee surgery  04/2013    REVIEW OF SYSTEMS:  A comprehensive review of systems was negative except for: Neurological: positive for paresthesia   PHYSICAL EXAMINATION: General appearance: alert, cooperative, and no distress Head: Normocephalic, without obvious abnormality, atraumatic Neck: no adenopathy, no JVD, supple, symmetrical, trachea midline, and thyroid not enlarged, symmetric, no tenderness/mass/nodules Lymph nodes: Cervical, supraclavicular, and axillary nodes normal. Resp: clear to auscultation bilaterally Back: symmetric, no curvature. ROM normal. No CVA tenderness. Cardio: regular rate and rhythm, S1, S2 normal, no murmur, click, rub or gallop GI: soft, non-tender; bowel sounds normal; no masses,  no organomegaly  Extremities: extremities normal, atraumatic, no cyanosis or edema  ECOG PERFORMANCE STATUS: 0 - Asymptomatic  Blood pressure (!) 144/68, pulse 72, temperature 97.6 F (36.4 C), temperature source Oral, resp. rate 15, height '5\' 2"'$  (1.575 m), weight 158 lb 6.4 oz (71.8 kg), SpO2 98 %.  LABORATORY DATA: Lab Results  Component Value Date   WBC 8.7 05/27/2022   HGB 11.8 (L) 05/27/2022   HCT 36.3 05/27/2022   MCV 90.8 05/27/2022   PLT 394 05/27/2022      Chemistry      Component Value  Date/Time   NA 139 02/26/2022 0851   K 3.4 (L) 02/26/2022 0851   CL 103 02/26/2022 0851   CO2 27 02/26/2022 0851   BUN 17 02/26/2022 0851   CREATININE 1.17 (H) 02/26/2022 0851      Component Value Date/Time   CALCIUM 9.2 02/26/2022 0851   ALKPHOS 67 02/26/2022 0851   AST 19 02/26/2022 0851   ALT 19 02/26/2022 0851   BILITOT 0.7 02/26/2022 0851       RADIOGRAPHIC STUDIES: No results found.  ASSESSMENT AND PLAN: This is a very pleasant 76 years old African-American female diagnosed with essential thrombocythemia in March 2022 with positive JAK2 mutation. The patient is currently on hydroxyurea 500 mg p.o. daily and has been tolerating it fairly well. Repeat CBC today showed normal total white blood count and platelet count.  Her hemoglobin improved to 11.8. I recommended for her to continue her current treatment with hydroxyurea with the same dose. I will see her back for follow-up visit in 3 months for evaluation and repeat blood work. She was advised to call immediately if she has any other concerning symptoms in the interval.  The patient voices understanding of current disease status and treatment options and is in agreement with the current care plan.  All questions were answered. The patient knows to call the clinic with any problems, questions or concerns. We can certainly see the patient much sooner if necessary.  Disclaimer: This note was dictated with voice recognition software. Similar sounding words can inadvertently be transcribed and may not be corrected upon review.

## 2022-06-10 DIAGNOSIS — Z1231 Encounter for screening mammogram for malignant neoplasm of breast: Secondary | ICD-10-CM | POA: Diagnosis not present

## 2022-06-10 DIAGNOSIS — Z6829 Body mass index (BMI) 29.0-29.9, adult: Secondary | ICD-10-CM | POA: Diagnosis not present

## 2022-06-10 DIAGNOSIS — N952 Postmenopausal atrophic vaginitis: Secondary | ICD-10-CM | POA: Diagnosis not present

## 2022-06-10 DIAGNOSIS — N959 Unspecified menopausal and perimenopausal disorder: Secondary | ICD-10-CM | POA: Diagnosis not present

## 2022-06-10 DIAGNOSIS — Z01419 Encounter for gynecological examination (general) (routine) without abnormal findings: Secondary | ICD-10-CM | POA: Diagnosis not present

## 2022-06-10 DIAGNOSIS — N958 Other specified menopausal and perimenopausal disorders: Secondary | ICD-10-CM | POA: Diagnosis not present

## 2022-06-10 DIAGNOSIS — Z13820 Encounter for screening for osteoporosis: Secondary | ICD-10-CM | POA: Diagnosis not present

## 2022-06-15 ENCOUNTER — Encounter: Payer: Self-pay | Admitting: Internal Medicine

## 2022-06-15 ENCOUNTER — Ambulatory Visit: Payer: Medicare PPO | Admitting: Internal Medicine

## 2022-06-15 VITALS — BP 120/62 | HR 75 | Ht 62.0 in | Wt 160.8 lb

## 2022-06-15 DIAGNOSIS — E1129 Type 2 diabetes mellitus with other diabetic kidney complication: Secondary | ICD-10-CM | POA: Diagnosis not present

## 2022-06-15 DIAGNOSIS — E785 Hyperlipidemia, unspecified: Secondary | ICD-10-CM | POA: Diagnosis not present

## 2022-06-15 DIAGNOSIS — R809 Proteinuria, unspecified: Secondary | ICD-10-CM

## 2022-06-15 LAB — POCT GLYCOSYLATED HEMOGLOBIN (HGB A1C): Hemoglobin A1C: 7 % — AB (ref 4.0–5.6)

## 2022-06-15 MED ORDER — ACCU-CHEK GUIDE W/DEVICE KIT: PACK | 0 refills | Status: AC

## 2022-06-15 NOTE — Progress Notes (Signed)
Patient ID: Ann [name], female   DOB: [birth year], 76 y.o.   MRN: [id number]   HPI: Ann [name] is a 76 y.o.-year-old female, initially referred by Dr. Orland Mustard, now returning for f/u for DM2, dx in 2006, non-insulin-dependent, uncontrolled, with complications (microalbuminuria). Last visit 4 months ago. PCP: DR. [name].  Interim history: No increased urination, blurry vision, nausea, chest pain.  Diarrhea improved  after reducing the metformin dose, but not resolved completely. Right after our last visit, she stopped checking blood sugars as her meter broke.  Reviewed HbA1c levels: Lab Results  Component Value Date   HGBA1C 6.4 (A) 02/12/2022   HGBA1C 6.3 (A) 10/07/2021   HGBA1C 6.5 (A) 06/09/2021   HGBA1C 7.0 (A) 02/10/2021   HGBA1C 7.1 (A) 10/17/2020   HGBA1C 7.1 (A) 06/13/2020   HGBA1C 6.9 (A) 02/08/2020   HGBA1C 7.3 02/07/2020   HGBA1C 7.1 (A) 10/05/2019   HGBA1C 7.1 (A) 06/16/2019   HGBA1C 6.7 (A) 02/03/2019   HGBA1C 7.3 (A) 09/30/2018   HGBA1C 6.5 (A) 06/06/2018   HGBA1C 7.9 (A) 03/15/2018   HGBA1C 6.7 10/07/2017  02/25/2016: 11.2% Previously 9.7% Previously 8.9%  Pt is on a regimen of: - Metformin ER 500 mg 2x a day >> diarrhea >> 500 mg daily - Amaryl 4 mg before dinner - Trulicity 1.5 >> 3 >> 1.5 mg weekly  She was on Metformin 1000 mg 2x a day >> diarrhea. She refused insulin.  Pt is not checking CBGs - meter is broken. From last OV. - am:  126-156, 179 (cupcakes) >> 120-130 >> 124-134 - 2h after b'fast: n/c - before lunch: n/c >> 136 (sick) >> n/c - 2h after lunch: n/c - before dinner: n/c >> 150-165 >> 150s >> 142-156 >> n/c - 2h after dinner: 130s >> n/c >> 165-187 >> <160 >> n/c - bedtime: 130s-160s, 201 >> n/c >> 150s-160 >> 152-160s - nighttime: n/c >> 143-179 >> n/c Lowest sugar was 119 >> 120 >> 90 >> 120 >> ?; she has hypoglycemia awareness in the 70s. Highest sugar was 201 >> 187 >> 179 >> 166 >> ? . We tried a Dexcom CGM for her  before but she is on hydroxyurea for essential thrombocytopenia and she had erratic values on it.  Glucometer: Freestyle Lite  Pt's meals are: - Breakfast: cereal, boiled egg, juice - Lunch: sandwich, salad - Dinner: meat + salad, potato - Snacks: 3 She saw nutrition in the past.  No CKD, last BUN/creatinine:  Lab Results  Component Value Date   BUN 18 05/27/2022   Lab Results  Component Value Date   CREATININE 1.18 (H) 05/27/2022  02/06/2021: 21/0.98, GFR 60, glucose 99 02/07/2020: BUN/creatinine 12/0.98 06/05/2019: BUN/creatinine 11/0.91, glucose 128, Calcium normal at 9.6 01/11/2018: CMP with a high glucose of 175.  BUN/creatinine normal 11/0.84, with the rest of the CMP being normal 06/30/2016: 14/0.92 02/25/2016: 16/0.92, EGFR 60/73. At that time, glucose was 259, with a sodium of 133.    He has a history of elevated ACR 02/12/2022: ACR pending 02/06/2021: ACR 19.8 02/07/2020: ACR 39.5 Lab Results  Component Value Date   MICRALBCREAT 1.7 09/30/2018  05/28/2015: Urine ACR 45.6 On olmesartan.  + HL;last set of lipids: 02/12/2022: Lipids pending 02/06/2021: 129/156/46/57 02/07/2020: 120/155/32/61 01/11/2018:  168/156/38/98 07/30/2017: 154/240/32/74 No results found for: "CHOL", "HDL", "LDLCALC", "LDLDIRECT", "TRIG", "CHOLHDL" 06/30/2016: 163/186/33/92 05/28/2015: 170/173/34/102 She started the Pravastatin 10  In 01/2018.  - last eye exam was on 11/04/2021: No DR, + macular degeneration.  She had laser surgery OS.  Dr. Thana Ates.  - no numbness and tingling in her feet.  Saw podiatry in 05/2021 for an melanonychia  -considered to be due to the hydroxyurea.  Last foot exam 05/2021.  She has essential thrombocytemia.  ROS: + see HPI  I reviewed pt's medications, allergies, PMH, social hx, family hx, and changes were documented in the history of present illness. Otherwise, unchanged from my initial visit note.  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      amLODipine (NORVASC) 10 MG tablet TAKE 1 TABLET BY MOUTH EVERY DAY 90 tablet 2   atenolol (TENORMIN) 50 MG tablet Take 50 mg by mouth daily. Unknown dose     clonazePAM (KLONOPIN) 0.5 MG tablet Take 0.5 mg by mouth daily as needed.     estradiol (VIVELLE-DOT) 0.075 MG/24HR Place 1 patch onto the skin 2 (two) times a week.     fluticasone (FLONASE) 50 MCG/ACT nasal spray      glimepiride (AMARYL) 4 MG tablet TAKE 1 TABLET BEFORE BREAKFAST AND 1/2 TABLET BEFORE DINNER 135 tablet 1   glucose blood (FREESTYLE TEST STRIPS) test strip Use 2x a day 100 each 11   hydroxyurea (HYDREA) 500 MG capsule TAKE 1 CAPSULE (500 MG TOTAL) BY MOUTH DAILY. MAY TAKE WITH FOOD TO MINIMIZE GI SIDE EFFECTS. 90 capsule 1   metFORMIN (GLUCOPHAGE-XR) 500 MG 24 hr tablet TAKE 1 TABLET BY MOUTH 2 TIMES DAILY WITH A MEAL. 180 tablet 3   montelukast (SINGULAIR) 10 MG tablet Take 10 mg by mouth at bedtime. Unknown dose     olmesartan (BENICAR) 20 MG tablet Take 20 mg by mouth daily.  0   pantoprazole (PROTONIX) 20 MG tablet Take 20 mg by mouth daily.     pravastatin (PRAVACHOL) 10 MG tablet Take 1 tablet (10 mg total) by mouth daily. 90 tablet 3   TRULICITY 1.5 YC/1.4GY SOPN INJECT 1.5 MG INTO THE SKIN ONCE A WEEK. 2 mL 3   No current facility-administered medications on file prior to visit.   Allergies  Allergen Reactions   Hydrocodone  Itching   Sulfa Antibiotics     FH: - Diabetes in mother, sister, brother - Hypertension in mother and sister - Hyperlipidemia in brother  PE: BP 120/62 (BP Location: Right Arm, Patient Position: Sitting, Cuff Size: Normal)   Pulse 75   Ht _0  (1.575 m)   Wt 160 lb 12.8 oz (72.9 kg)   SpO2 99%   BMI 29.41 kg/m  Wt Readings from Last 3 Encounters:  06/15/22 160 lb 12.8 oz (72.9 kg)  05/27/22 158 lb 6.4 oz (71.8 kg)  02/26/22 157 lb 6.4 oz (71.4 kg)   Constitutional: normal weight, in NAD Eyes: no exophthalmos ENT: no thyromegaly, no cervical lymphadenopathy  Cardiovascular: RRR, No MRG Respiratory: CTA B Musculoskeletal: no deformities Skin: no rashes Neurological: no tremor with outstretched hands  ASSESSMENT: 1. DM2, non-insulin-dependent, uncontrolled, with complications - microalbuminuria  2. HL  3. MAU  PLAN:  1. Patient with longstanding, previously uncontrolled type 2 diabetes, with improvement in control after adding Trulicity to her oral regimen with metformin and sulfonylurea.  At last visit, sugars were at or slightly above goal in a.m. and at goal later in the day.  We discussed about possibly taking a higher dose of Amaryl before a larger dinner and also reduce the dose of metformin since she complained of diarrhea with that. -At today's visit, she is not checking blood sugars.  Her meter broke right after our last visit.  We discussed that if this happens again she can contact me through [facility].  For now, we will send a new meter prescription to her pharmacy. Unfortunately, since last visit, she also forgot that she was taking Amaryl twice a day and she now only takes it before dinner.  -Based on the HbA1c today, sugars are higher so we will add back Amaryl in the morning.  We will continue the rest of the regimen for now.  I strongly advised her to restart checking blood sugars once a day, rotating check times. - I suggested to:  Patient Instructions   Please continue: - Metformin ER 500 mg 1x a day with dinner - Trulicity 1.5 mg weekly  Increase back: - Amaryl 4 mg in am and 4 mg before dinner  Restart checking sugars.   Please return in 4 months with your sugar log.   - we checked her HbA1c: 7.0% (higher) - advised for yearly eye exams >> she is UTD - return to clinic in 4 months  2. HL - Reviewed latest lipid panel 07/22: Fractions at goal with the exception of a slightly high triglyceride level No results found for: "CHOL", "HDL", "LDLCALC", "LDLDIRECT", "TRIG", "CHOLHDL" - Continues pravastatin 10 mg daily without side effects. -She did have another lipid panel with PCP before last visit but I do not have these results  3. MAU -She has a history of microalbuminuria with a previous level being slightly high, at 39.5, but a normal level, of 19.8 in 01/2021 -She continues on olmesartan 20 mg daily -will need to get the result of the last ACR check from PCP  Philemon Kingdom, MD PhD [facility] Endocrinology

## 2022-06-15 NOTE — Patient Instructions (Addendum)
Please continue: - Metformin ER 500 mg 1x a day with dinner - Trulicity 1.5 mg weekly  Increase back: - Amaryl 4 mg in am and 4 mg before dinner  Restart checking sugars.   Please return in 4 months with your sugar log.

## 2022-06-25 DIAGNOSIS — H35363 Drusen (degenerative) of macula, bilateral: Secondary | ICD-10-CM | POA: Diagnosis not present

## 2022-06-25 DIAGNOSIS — H31093 Other chorioretinal scars, bilateral: Secondary | ICD-10-CM | POA: Diagnosis not present

## 2022-06-25 DIAGNOSIS — H35371 Puckering of macula, right eye: Secondary | ICD-10-CM | POA: Diagnosis not present

## 2022-06-25 DIAGNOSIS — H35053 Retinal neovascularization, unspecified, bilateral: Secondary | ICD-10-CM | POA: Diagnosis not present

## 2022-06-25 DIAGNOSIS — H3562 Retinal hemorrhage, left eye: Secondary | ICD-10-CM | POA: Diagnosis not present

## 2022-06-25 DIAGNOSIS — H25813 Combined forms of age-related cataract, bilateral: Secondary | ICD-10-CM | POA: Diagnosis not present

## 2022-07-13 DIAGNOSIS — J101 Influenza due to other identified influenza virus with other respiratory manifestations: Secondary | ICD-10-CM | POA: Diagnosis not present

## 2022-07-26 ENCOUNTER — Other Ambulatory Visit: Payer: Self-pay | Admitting: Internal Medicine

## 2022-08-11 ENCOUNTER — Telehealth: Payer: Self-pay

## 2022-08-11 DIAGNOSIS — E1129 Type 2 diabetes mellitus with other diabetic kidney complication: Secondary | ICD-10-CM

## 2022-08-11 MED ORDER — TRULICITY 0.75 MG/0.5ML ~~LOC~~ SOAJ: 0.7500 mg | SUBCUTANEOUS | 5 refills | Status: DC

## 2022-08-11 NOTE — Telephone Encounter (Signed)
Pt request a rx for the 5.99 mg dose for Trulicity as the 1.5 mg dose is on backorder.

## 2022-08-11 NOTE — Telephone Encounter (Signed)
Sent!

## 2022-08-19 DIAGNOSIS — E785 Hyperlipidemia, unspecified: Secondary | ICD-10-CM | POA: Diagnosis not present

## 2022-08-19 DIAGNOSIS — F411 Generalized anxiety disorder: Secondary | ICD-10-CM | POA: Diagnosis not present

## 2022-08-19 DIAGNOSIS — I10 Essential (primary) hypertension: Secondary | ICD-10-CM | POA: Diagnosis not present

## 2022-08-19 DIAGNOSIS — D473 Essential (hemorrhagic) thrombocythemia: Secondary | ICD-10-CM | POA: Diagnosis not present

## 2022-08-19 DIAGNOSIS — Z1589 Genetic susceptibility to other disease: Secondary | ICD-10-CM | POA: Diagnosis not present

## 2022-08-19 DIAGNOSIS — E1165 Type 2 diabetes mellitus with hyperglycemia: Secondary | ICD-10-CM | POA: Diagnosis not present

## 2022-08-21 DIAGNOSIS — M79673 Pain in unspecified foot: Secondary | ICD-10-CM | POA: Diagnosis not present

## 2022-08-21 DIAGNOSIS — M109 Gout, unspecified: Secondary | ICD-10-CM | POA: Diagnosis not present

## 2022-08-24 ENCOUNTER — Other Ambulatory Visit: Payer: Self-pay | Admitting: Internal Medicine

## 2022-08-27 ENCOUNTER — Other Ambulatory Visit: Payer: Medicare PPO

## 2022-08-27 ENCOUNTER — Encounter: Payer: Self-pay | Admitting: Internal Medicine

## 2022-08-27 ENCOUNTER — Inpatient Hospital Stay: Payer: Medicare PPO | Attending: Internal Medicine

## 2022-08-27 ENCOUNTER — Inpatient Hospital Stay (HOSPITAL_BASED_OUTPATIENT_CLINIC_OR_DEPARTMENT_OTHER): Payer: Medicare PPO | Admitting: Internal Medicine

## 2022-08-27 ENCOUNTER — Ambulatory Visit: Payer: Medicare PPO | Admitting: Internal Medicine

## 2022-08-27 VITALS — BP 144/67 | HR 66 | Temp 97.7°F | Resp 16 | Wt 163.9 lb

## 2022-08-27 DIAGNOSIS — E1165 Type 2 diabetes mellitus with hyperglycemia: Secondary | ICD-10-CM | POA: Insufficient documentation

## 2022-08-27 DIAGNOSIS — D75839 Thrombocytosis, unspecified: Secondary | ICD-10-CM | POA: Diagnosis not present

## 2022-08-27 DIAGNOSIS — D473 Essential (hemorrhagic) thrombocythemia: Secondary | ICD-10-CM | POA: Insufficient documentation

## 2022-08-27 DIAGNOSIS — E876 Hypokalemia: Secondary | ICD-10-CM | POA: Insufficient documentation

## 2022-08-27 DIAGNOSIS — I10 Essential (primary) hypertension: Secondary | ICD-10-CM | POA: Diagnosis not present

## 2022-08-27 DIAGNOSIS — D649 Anemia, unspecified: Secondary | ICD-10-CM | POA: Insufficient documentation

## 2022-08-27 LAB — CMP (CANCER CENTER ONLY)
ALT: 27 U/L (ref 0–44)
AST: 30 U/L (ref 15–41)
Albumin: 4 g/dL (ref 3.5–5.0)
Alkaline Phosphatase: 76 U/L (ref 38–126)
Anion gap: 8 (ref 5–15)
BUN: 19 mg/dL (ref 8–23)
CO2: 27 mmol/L (ref 22–32)
Calcium: 9 mg/dL (ref 8.9–10.3)
Chloride: 103 mmol/L (ref 98–111)
Creatinine: 0.98 mg/dL (ref 0.44–1.00)
GFR, Estimated: >60 mL/min (ref >60)
Glucose, Bld: 155 mg/dL — ABNORMAL HIGH (ref 70–99)
Potassium: 3.3 mmol/L — ABNORMAL LOW (ref 3.5–5.1)
Sodium: 138 mmol/L (ref 135–145)
Total Bilirubin: 0.5 mg/dL (ref 0.3–1.2)
Total Protein: 8.3 g/dL — ABNORMAL HIGH (ref 6.5–8.1)

## 2022-08-27 LAB — CBC WITH DIFFERENTIAL (CANCER CENTER ONLY)
Abs Immature Granulocytes: 0.02 10*3/uL (ref 0.00–0.07)
Basophils Absolute: 0.1 10*3/uL (ref 0.0–0.1)
Basophils Relative: 2 %
Eosinophils Absolute: 0.2 10*3/uL (ref 0.0–0.5)
Eosinophils Relative: 2 %
HCT: 35.7 % — ABNORMAL LOW (ref 36.0–46.0)
Hemoglobin: 11.6 g/dL — ABNORMAL LOW (ref 12.0–15.0)
Immature Granulocytes: 0 %
Lymphocytes Relative: 26 %
Lymphs Abs: 2.1 10*3/uL (ref 0.7–4.0)
MCH: 28.6 pg (ref 26.0–34.0)
MCHC: 32.5 g/dL (ref 30.0–36.0)
MCV: 88.1 fL (ref 80.0–100.0)
Monocytes Absolute: 0.3 10*3/uL (ref 0.1–1.0)
Monocytes Relative: 4 %
Neutro Abs: 5.5 10*3/uL (ref 1.7–7.7)
Neutrophils Relative %: 66 %
Platelet Count: 405 10*3/uL — ABNORMAL HIGH (ref 150–400)
RBC: 4.05 MIL/uL (ref 3.87–5.11)
RDW: 14.8 % (ref 11.5–15.5)
WBC Count: 8.2 10*3/uL (ref 4.0–10.5)
nRBC: 0 % (ref 0.0–0.2)

## 2022-08-27 LAB — LACTATE DEHYDROGENASE: LDH: 167 U/L (ref 98–192)

## 2022-08-27 NOTE — Progress Notes (Signed)
[place] Telephone:(336) [phone]   Fax:(336) [phone]  OFFICE PROGRESS NOTE  [name], MD [address] [place] [address]  DIAGNOSIS: Essential thrombocythemia diagnosed in March 2022.  PRIOR THERAPY: None  CURRENT THERAPY: Hydroxyurea 500 mg p.o. daily.  Started October 23, 2020.    INTERVAL HISTORY: Ann [name] 77 y.o. female returns to the clinic today for follow-up visit.  The patient is feeling fine today with no concerning complaints.  She denied having any current chest pain, shortness of breath, cough or hemoptysis.  She has no nausea, vomiting, diarrhea or constipation.  She has no recent weight loss or night sweats.  She has no headache or visual changes.  She has been tolerating her treatment with hydroxyurea fairly well.  The patient is here today for evaluation and repeat blood work.   MEDICAL HISTORY: Past Medical History:  Diagnosis Date   Diabetes mellitus without complication ([place])    Hypertension     ALLERGIES:  is allergic to hydrocodone and sulfa antibiotics.  MEDICATIONS:  Current Outpatient Medications  Medication Sig Dispense Refill   COLCRYS 0.6 MG tablet Take 0.6 mg by mouth daily as needed (for gout).     allopurinol (ZYLOPRIM) 100 MG tablet Take 200 mg by mouth at bedtime.      amLODipine (NORVASC) 10 MG tablet TAKE 1 TABLET BY MOUTH EVERY DAY 90 tablet 2   atenolol (TENORMIN) 50 MG tablet Take 50 mg by mouth daily. Unknown dose     Blood Glucose Monitoring Suppl (ACCU-CHEK GUIDE) w/Device KIT Use as instructed to check blood sugar 2X daily. 1 kit 0   clonazePAM (KLONOPIN) 0.5 MG tablet Take 0.5 mg by mouth daily as needed.     Dulaglutide (TRULICITY) 0.10 XN/2.3FT SOPN Inject 0.75 mg into the skin once a week. 2 mL 5   Dulaglutide (TRULICITY) 1.5 DD/2.2GU SOPN INJECT 1.5 MG INTO THE SKIN ONCE A WEEK. 2 mL 1   estradiol (VIVELLE-DOT) 0.075 MG/24HR Place 1 patch onto the skin 2 (two) times a week.      fluticasone (FLONASE) 50 MCG/ACT nasal spray      glimepiride (AMARYL) 4 MG tablet TAKE 1 TABLET BEFORE BREAKFAST AND 1 TABLET BEFORE DINNER 180 tablet 1   glucose blood (FREESTYLE TEST STRIPS) test strip Use 2x a day 100 each 11   hydroxyurea (HYDREA) 500 MG capsule TAKE 1 CAPSULE (500 MG TOTAL) BY MOUTH DAILY. MAY TAKE WITH FOOD TO MINIMIZE GI SIDE EFFECTS. 90 capsule 1   metFORMIN (GLUCOPHAGE-XR) 500 MG 24 hr tablet TAKE 1 TABLET BY MOUTH 2 TIMES DAILY WITH A MEAL. 180 tablet 3   montelukast (SINGULAIR) 10 MG tablet Take 10 mg by mouth at bedtime. Unknown dose     olmesartan (BENICAR) 20 MG tablet Take 20 mg by mouth daily.  0   pantoprazole (PROTONIX) 20 MG tablet Take 20 mg by mouth daily.     pravastatin (PRAVACHOL) 10 MG tablet Take 1 tablet (10 mg total) by mouth daily. 90 tablet 3   No current facility-administered medications for this visit.    SURGICAL HISTORY:  Past Surgical History:  Procedure Laterality Date   ABDOMINAL HYSTERECTOMY     knee surgery  04/2013    REVIEW OF SYSTEMS:  A comprehensive review of systems was negative.   PHYSICAL EXAMINATION: General appearance: alert, cooperative, and no distress Head: Normocephalic, without obvious abnormality, atraumatic Neck: no adenopathy, no JVD, supple, symmetrical, trachea midline,  and thyroid not enlarged, symmetric, no tenderness/mass/nodules Lymph nodes: Cervical, supraclavicular, and axillary nodes normal. Resp: clear to auscultation bilaterally Back: symmetric, no curvature. ROM normal. No CVA tenderness. Cardio: regular rate and rhythm, S1, S2 normal, no murmur, click, rub or gallop GI: soft, non-tender; bowel sounds normal; no masses,  no organomegaly Extremities: extremities normal, atraumatic, no cyanosis or edema  ECOG PERFORMANCE STATUS: 0 - Asymptomatic  Blood pressure (!) 144/67, pulse 66, temperature 97.7 F (36.5 C), temperature source Oral, resp. rate 16, weight 163 lb 14.4 oz (74.3 kg), SpO2 100  %.  LABORATORY DATA: Lab Results  Component Value Date   WBC 8.2 08/27/2022   HGB 11.6 (L) 08/27/2022   HCT 35.7 (L) 08/27/2022   MCV 88.1 08/27/2022   PLT 405 (H) 08/27/2022      Chemistry      Component Value Date/Time   NA 138 08/27/2022 0814   K 3.3 (L) 08/27/2022 0814   CL 103 08/27/2022 0814   CO2 27 08/27/2022 0814   BUN 19 08/27/2022 0814   CREATININE 0.98 08/27/2022 0814      Component Value Date/Time   CALCIUM 9.0 08/27/2022 0814   ALKPHOS 76 08/27/2022 0814   AST 30 08/27/2022 0814   ALT 27 08/27/2022 0814   BILITOT 0.5 08/27/2022 0814       RADIOGRAPHIC STUDIES: No results found.  ASSESSMENT AND PLAN: This is a very pleasant 77 years old African-American female diagnosed with essential thrombocythemia in March 2022 with positive JAK2 mutation. The patient is currently on hydroxyurea 500 mg p.o. daily and has been tolerating it fairly well. She had repeat CBC, comprehensive metabolic panel and LDH performed earlier today.  Her labs are unremarkable except for mild anemia as well as mild thrombocytosis with platelets count of 405,000.  She continues to have slightly elevated blood sugar and hypokalemia. I recommended for the patient to continue her current treatment with hydroxyurea with the same dose. I will see her back for follow-up visit in 3 months for evaluation and repeat blood work. For the hypokalemia, she was encouraged to increase the potassium rich diet. The patient was advised to call immediately if she has any other concerning symptoms in the interval.  The patient voices understanding of current disease status and treatment options and is in agreement with the current care plan.  All questions were answered. The patient knows to call the clinic with any problems, questions or concerns. We can certainly see the patient much sooner if necessary.  Disclaimer: This note was dictated with voice recognition software. Similar sounding words can  inadvertently be transcribed and may not be corrected upon review.

## 2022-10-07 ENCOUNTER — Other Ambulatory Visit: Payer: Self-pay | Admitting: Internal Medicine

## 2022-10-12 ENCOUNTER — Telehealth: Payer: Self-pay | Admitting: Internal Medicine

## 2022-10-12 NOTE — Telephone Encounter (Signed)
Per 3/18 ib reached out to patient to answer questions regarding appointments.

## 2022-10-15 ENCOUNTER — Encounter: Payer: Self-pay | Admitting: Internal Medicine

## 2022-10-15 ENCOUNTER — Ambulatory Visit: Payer: Medicare PPO | Admitting: Internal Medicine

## 2022-10-15 VITALS — BP 120/76 | HR 65 | Ht 62.0 in | Wt 164.8 lb

## 2022-10-15 DIAGNOSIS — E785 Hyperlipidemia, unspecified: Secondary | ICD-10-CM

## 2022-10-15 DIAGNOSIS — E1129 Type 2 diabetes mellitus with other diabetic kidney complication: Secondary | ICD-10-CM | POA: Diagnosis not present

## 2022-10-15 DIAGNOSIS — R809 Proteinuria, unspecified: Secondary | ICD-10-CM

## 2022-10-15 LAB — POCT GLYCOSYLATED HEMOGLOBIN (HGB A1C): Hemoglobin A1C: 7.9 % — AB (ref 4.0–5.6)

## 2022-10-15 MED ORDER — SEMAGLUTIDE(0.25 OR 0.5MG/DOS) 2 MG/3ML ~~LOC~~ SOPN: 0.5000 mg | PEN_INJECTOR | SUBCUTANEOUS | 5 refills | Status: DC

## 2022-10-15 NOTE — Patient Instructions (Addendum)
Please continue: - Metformin ER 500 mg 1x a day with dinner - Amaryl 4 mg before dinner  Please try to change to: - Ozempic 0.5 mg weekly  If this is not possible, increase: - Amaryl 4 mg in am and 4 mg before dinner   Please return in 4 months with your sugar log.

## 2022-10-15 NOTE — Progress Notes (Signed)
Patient ID: Ann [name], female   DOB: 1946-07-01, 77 y.o.   MRN: [id number]   HPI: Ann [name] is a 77 y.o.-year-old female, initially referred by Dr. Orland Mustard, now returning for f/u for DM2, dx in 2006, non-insulin-dependent, uncontrolled, with complications (microalbuminuria). Last visit 4 months ago. PCP: DR. [name].  Interim history: No increased urination, blurry vision, nausea, chest pain.  She does have itching, which she attributes this to Essential Thrombocytopenia. Diarrhea improved  after reducing the metformin dose, but not resolved completely.  Reviewed HbA1c levels: Lab Results  Component Value Date   HGBA1C 7.0 (A) 06/15/2022   HGBA1C 6.4 (A) 02/12/2022   HGBA1C 6.3 (A) 10/07/2021   HGBA1C 6.5 (A) 06/09/2021   HGBA1C 7.0 (A) 02/10/2021   HGBA1C 7.1 (A) 10/17/2020   HGBA1C 7.1 (A) 06/13/2020   HGBA1C 6.9 (A) 02/08/2020   HGBA1C 7.3 02/07/2020   HGBA1C 7.1 (A) 10/05/2019   HGBA1C 7.1 (A) 06/16/2019   HGBA1C 6.7 (A) 02/03/2019   HGBA1C 7.3 (A) 09/30/2018   HGBA1C 6.5 (A) 06/06/2018   HGBA1C 7.9 (A) 03/15/2018  02/25/2016: 11.2% Previously 9.7% Previously 8.9%  Pt is on a regimen of: - Metformin ER 500 mg 2x a day >> diarrhea >> 500 mg daily - Amaryl 4 mg before dinner >> >> 4 mg before D (forgot to increase the dose) - Trulicity 1.5 >> 3 >> 1.5 >> 0.75 mg weekly (was off for 3 weeks, then could only get the lower dose) She was on Metformin 1000 mg 2x a day >> diarrhea. She refused insulin.  She was not checking sugars at last visit.  Now checking 0 to once a day: - am:  126-156, 179 (cupcakes) >> 120-130 >> 124-134 >> 122-150s - 2h after b'fast: n/c - before lunch: n/c >> 136 (sick) >> n/c - 2h after lunch: n/c - before dinner: n/c >> 150-165 >> 150s >> 142-156 >> n/c - 2h after dinner: 130s >> n/c >> 165-187 >> <160 >> n/c - bedtime: 130s-160s, 201 >> n/c >> 150s-160 >> 152-160s >> n/c - nighttime: n/c >> 143-179 >> n/c Lowest sugar was 119  >> 120 >> 90 >> 120 >> 122; she has hypoglycemia awareness in the 70s. Highest sugar was 201 >> 187 >> 179 >> 166 >> 150. We tried a Dexcom CGM for her before but she is on hydroxyurea for essential thrombocytopenia and she had erratic values on it.  Glucometer: Freestyle Lite  Pt's meals are: - Breakfast: cereal, boiled egg, juice - Lunch: sandwich, salad - Dinner: meat + salad, potato - Snacks: 3 She saw nutrition in the past.  No CKD, last BUN/creatinine:  Lab Results  Component Value Date   BUN 19 08/27/2022   Lab Results  Component Value Date   CREATININE 0.98 08/27/2022  02/06/2021: 21/0.98, GFR 60, glucose 99 02/07/2020: BUN/creatinine 12/0.98 06/05/2019: BUN/creatinine 11/0.91, glucose 128, Calcium normal at 9.6 01/11/2018: CMP with a high glucose of 175.  BUN/creatinine normal 11/0.84, with the rest of the CMP being normal 06/30/2016: 14/0.92 02/25/2016: 16/0.92, EGFR 60/73. At that time, glucose was 259, with a sodium of 133.    He has a history of elevated ACR: Microalbumin Panel Reviewed date:02/16/2022 11:48:26 AM Interpretation:73.1 Performing Lab: Notes/Report: Testing Performed at: [facility], [address], [place], [place] [address]  MA/CR ratio 73.1 0.0-30.0 mg/G    UMA 9.06 0.00-1.90 mg/dL    UCR 124     02/06/2021: ACR 19.8 02/07/2020: ACR 39.5 Lab  Results  Component Value Date   MICRALBCREAT 1.7 09/30/2018  05/28/2015: Urine ACR 45.6 On olmesartan.  + HL;last set of lipids: Lipid Panel w/reflex Reviewed date:08/21/2022 05:25:14 PM Interpretation:Trig 229 Performing Lab: Notes/Report: Testing Performed at: [facility], [address], [address], [place], [place] [address]  Cholesterol 153 <200 mg/dL    CHOL/HDL 3.8 2.0-4.0 Ratio    HDLD 41 30-85 mg/dL Values below 40 mg/dL indicate increased risk factor  Triglyceride 229 0-199 mg/dL    NHDL 113 0-129 mg/dL Range dependent upon risk factors.  LDL Chol Calc (NIH) 75 0-99 mg/dL      Lipid Panel w/reflex Reviewed date:02/16/2022 11:48:26 AM Interpretation:Trig 246 Performing Lab: Notes/Report: Testing Performed at: [facility], [address], [place], [place] [address]  Cholesterol 133 <200 mg/dL    CHOL/HDL 3.8 2.0-4.0 Ratio    HDLD 35 30-85 mg/dL Values below 40 mg/dL indicate increased risk factor  Triglyceride 246 0-199 mg/dL    NHDL 98 0-129 mg/dL Range dependent upon risk factors.  LDL Chol Calc (NIH) 59 0-99 mg/dL     02/06/2021: 129/156/46/57 02/07/2020: 120/155/32/61 01/11/2018:  168/156/38/98 07/30/2017: 154/240/32/74 No results found for: "CHOL", "HDL", "LDLCALC", "LDLDIRECT", "TRIG", "CHOLHDL" 06/30/2016: 163/186/33/92 05/28/2015: 170/173/34/102 She started the Pravastatin 10  In 01/2018.  - last eye exam was on 11/04/2021: No DR, + macular degeneration.  She had laser surgery OS.  Dr. Thana Ates.  - no numbness and tingling in her feet.  Saw podiatry in 05/2021 for an melanonychia  -considered to be due to the hydroxyurea.  Last foot exam 05/2022.  She has essential thrombocytemia.  Latest TSH was normal: Component Value Reference Range Notes  Thyroid Diagnostic Cascade Reviewed date:08/21/2022 05:23:07 PM Interpretation:2.26 Performing Lab: Notes/Report: Testing Performed at: [facility], [address], [address], [place], [place] [address]  TSH 2.26 0.34-4.50 UlU/mL     ROS: + see HPI  I reviewed pt's medications, allergies, PMH, social hx, family hx, and changes were documented in the history of present illness. Otherwise, unchanged from my initial visit note.  Past Medical History:  Diagnosis Date   Diabetes mellitus without complication ([place])    Hypertension    Past Surgical History:  Procedure Laterality Date   ABDOMINAL HYSTERECTOMY     knee surgery  04/2013   Social History   Social History   Marital status: Married    Spouse name: N/A   Number of children: 3   Occupational History   Retired Tourist information centre manager    Social History Main Topics   Smoking status: Never Smoker   Smokeless tobacco: No   Alcohol use No   Drug use: no   Current Outpatient Medications on File Prior to Visit  Medication Sig Dispense Refill   allopurinol (ZYLOPRIM) 100 MG tablet Take 200 mg by mouth at bedtime.      amLODipine (NORVASC) 10 MG tablet TAKE 1 TABLET BY MOUTH EVERY DAY 90 tablet 0   atenolol (TENORMIN) 50 MG tablet Take 50 mg by mouth daily. Unknown dose     Blood Glucose Monitoring Suppl (ACCU-CHEK GUIDE) w/Device KIT Use as instructed to check blood sugar 2X daily. 1 kit 0   clonazePAM (KLONOPIN) 0.5 MG tablet Take 0.5 mg by mouth daily as needed.     COLCRYS 0.6 MG tablet Take 0.6 mg by mouth daily as needed (for gout).     Dulaglutide (TRULICITY) A999333 0000000 SOPN Inject 0.75 mg into the skin once a week. 2 mL 5   Dulaglutide (  TRULICITY) 1.5 0000000 SOPN INJECT 1.5 MG INTO THE SKIN ONCE A WEEK. 2 mL 1   estradiol (VIVELLE-DOT) 0.075 MG/24HR Place 1 patch onto the skin 2 (two) times a week.     fluticasone (FLONASE) 50 MCG/ACT nasal spray      glimepiride (AMARYL) 4 MG tablet TAKE 1 TABLET BEFORE BREAKFAST AND 1 TABLET BEFORE DINNER 180 tablet 1   glucose blood (FREESTYLE TEST STRIPS) test strip Use 2x a day 100 each 11   hydroxyurea (HYDREA) 500 MG capsule TAKE 1 CAPSULE (500 MG TOTAL) BY MOUTH DAILY. MAY TAKE WITH FOOD TO MINIMIZE GI SIDE EFFECTS. 90 capsule 1   metFORMIN (GLUCOPHAGE-XR) 500 MG 24 hr tablet TAKE 1 TABLET BY MOUTH 2 TIMES DAILY WITH A MEAL. 180 tablet 3   montelukast (SINGULAIR) 10 MG tablet Take 10 mg by mouth at bedtime. Unknown dose     olmesartan (BENICAR) 20 MG tablet Take 20 mg by mouth daily.  0   pantoprazole (PROTONIX) 20 MG tablet Take 20 mg by mouth daily.     pravastatin (PRAVACHOL) 10 MG tablet Take 1 tablet (10 mg total) by mouth daily. 90 tablet 3   No current facility-administered medications on file prior to visit.   Allergies  Allergen Reactions   Hydrocodone Itching    Sulfa Antibiotics     FH: - Diabetes in mother, sister, brother - Hypertension in mother and sister - Hyperlipidemia in brother  PE: BP 120/76 (BP Location: Right Arm, Patient Position: Sitting, Cuff Size: Normal)   Pulse 65   Ht 5\' 2"  (1.575 m)   Wt 164 lb 12.8 oz (74.8 kg)   SpO2 99%   BMI 30.14 kg/m  Wt Readings from Last 3 Encounters:  10/15/22 164 lb 12.8 oz (74.8 kg)  08/27/22 163 lb 14.4 oz (74.3 kg)  06/15/22 160 lb 12.8 oz (72.9 kg)   Constitutional: normal weight, in NAD Eyes: no exophthalmos ENT: no thyromegaly, no cervical lymphadenopathy Cardiovascular: RRR, No MRG Respiratory: CTA B Musculoskeletal: no deformities Skin: no rashes Neurological: no tremor with outstretched hands  ASSESSMENT: 1. DM2, non-insulin-dependent, uncontrolled, with complications - microalbuminuria  2. HL  3. MAU  PLAN:  1. Patient with longstanding, previously uncontrolled type 2 diabetes, with improvement in control after adding Trulicity to her oral regimen with metformin and sulfonylurea.  At last visit, however, sugars are higher so we increased her Amaryl dose.  She was not checking blood sugars then and I advised her to start.  Since then, she was not able to obtain the 1.5 mg dose of Trulicity so we decreased the dose to 0.75 mg weekly. -At today's visit, her sugars are higher in the morning, mostly between 130 and 150, occasionally slightly lower.  She is not checking later in the day.  We again discussed that ideally should check at least at bedtime. -She was not able to obtain her regular dose of Trulicity, 1.5 mg weekly, due to [employer].  She is currently using the 0.75 mg weekly, which is not enough.  We discussed that we have several options: I will try to send Ozempic 1.5 mg weekly to her pharmacy to see if she can switch to this.  If not, she will take 2 injections of A999333 mg of Trulicity weekly.  We can try to send this again to the pharmacy with 2 injections a  week, but it is unclear whether this would be covered by insurance or not.  Ultimately, she could also increase the  Amaryl dose to 4 mg twice a day if she is not able to get any of the above.  Will continue metformin at the same dose for now. -At next visit, we can explore adding an SGLT2 inhibitor for both diabetes control and to help with microalbuminuria. - I suggested to:  Patient Instructions  Please continue: - Metformin ER 500 mg 1x a day with dinner - Amaryl 4 mg before dinner  Please try to change to: - Ozempic 0.5 mg weekly  If this is not possible, increase: - Amaryl 4 mg in am and 4 mg before dinner   Please return in 4 months with your sugar log.   - we checked her HbA1c: 7.9% (higher) - advised to check sugars at different times of the day - 1x a day, rotating check times - advised for yearly eye exams >> she is UTD - return to clinic in 4 months  2. HL -Reviewed latest lipid panel from 01/2022: LDL above target, as is her triglyceride level.  HDL low. No results found for: "CHOL", "HDL", "LDLCALC", "LDLDIRECT", "TRIG", "CHOLHDL" -Improving diabetes control should also help with lipid levels. -continues pravastatin 10 mg daily without side effects.  3. MAU -She has a history of microalbuminuria with a previous level being slightly high, at 39.5, but a normal level, of 19.8 in 01/2021 -At last check in 01/2022, ACR was elevated, at 73.1 -she has had fluctuating ACR levels within normal range and above target -She continues on olmesartan 20 mg daily  Philemon Kingdom, MD PhD [facility]

## 2022-11-10 DIAGNOSIS — H2513 Age-related nuclear cataract, bilateral: Secondary | ICD-10-CM | POA: Diagnosis not present

## 2022-11-10 DIAGNOSIS — H524 Presbyopia: Secondary | ICD-10-CM | POA: Diagnosis not present

## 2022-11-10 DIAGNOSIS — E119 Type 2 diabetes mellitus without complications: Secondary | ICD-10-CM | POA: Diagnosis not present

## 2022-11-10 LAB — HM DIABETES EYE EXAM

## 2022-11-11 ENCOUNTER — Encounter: Payer: Self-pay | Admitting: Internal Medicine

## 2022-11-26 ENCOUNTER — Other Ambulatory Visit: Payer: Self-pay

## 2022-11-26 ENCOUNTER — Inpatient Hospital Stay: Payer: Medicare PPO | Admitting: Internal Medicine

## 2022-11-26 ENCOUNTER — Inpatient Hospital Stay: Payer: Medicare PPO | Attending: Internal Medicine

## 2022-11-26 DIAGNOSIS — D473 Essential (hemorrhagic) thrombocythemia: Secondary | ICD-10-CM

## 2022-11-26 DIAGNOSIS — Z90710 Acquired absence of both cervix and uterus: Secondary | ICD-10-CM | POA: Diagnosis not present

## 2022-11-26 LAB — CMP (CANCER CENTER ONLY)
ALT: 23 U/L (ref 0–44)
AST: 23 U/L (ref 15–41)
Albumin: 4.4 g/dL (ref 3.5–5.0)
Alkaline Phosphatase: 78 U/L (ref 38–126)
Anion gap: 9 (ref 5–15)
BUN: 25 mg/dL — ABNORMAL HIGH (ref 8–23)
CO2: 28 mmol/L (ref 22–32)
Calcium: 9.8 mg/dL (ref 8.9–10.3)
Chloride: 101 mmol/L (ref 98–111)
Creatinine: 1.11 mg/dL — ABNORMAL HIGH (ref 0.44–1.00)
GFR, Estimated: 52 mL/min — ABNORMAL LOW (ref >60)
Glucose, Bld: 125 mg/dL — ABNORMAL HIGH (ref 70–99)
Potassium: 3.6 mmol/L (ref 3.5–5.1)
Sodium: 138 mmol/L (ref 135–145)
Total Bilirubin: 0.8 mg/dL (ref 0.3–1.2)
Total Protein: 8.9 g/dL — ABNORMAL HIGH (ref 6.5–8.1)

## 2022-11-26 LAB — CBC WITH DIFFERENTIAL (CANCER CENTER ONLY)
Abs Immature Granulocytes: 0.05 10*3/uL (ref 0.00–0.07)
Basophils Absolute: 0.1 10*3/uL (ref 0.0–0.1)
Basophils Relative: 1 %
Eosinophils Absolute: 0.1 10*3/uL (ref 0.0–0.5)
Eosinophils Relative: 2 %
HCT: 39.2 % (ref 36.0–46.0)
Hemoglobin: 12.7 g/dL (ref 12.0–15.0)
Immature Granulocytes: 1 %
Lymphocytes Relative: 22 %
Lymphs Abs: 2 10*3/uL (ref 0.7–4.0)
MCH: 28.9 pg (ref 26.0–34.0)
MCHC: 32.4 g/dL (ref 30.0–36.0)
MCV: 89.1 fL (ref 80.0–100.0)
Monocytes Absolute: 0.5 10*3/uL (ref 0.1–1.0)
Monocytes Relative: 5 %
Neutro Abs: 6.5 10*3/uL (ref 1.7–7.7)
Neutrophils Relative %: 69 %
Platelet Count: 417 10*3/uL — ABNORMAL HIGH (ref 150–400)
RBC: 4.4 MIL/uL (ref 3.87–5.11)
RDW: 15 % (ref 11.5–15.5)
WBC Count: 9.3 10*3/uL (ref 4.0–10.5)
nRBC: 0 % (ref 0.0–0.2)

## 2022-11-26 LAB — LACTATE DEHYDROGENASE: LDH: 135 U/L (ref 98–192)

## 2022-11-26 NOTE — Addendum Note (Signed)
Addended by: Charma Igo on: 11/26/2022 10:10 AM   Modules accepted: Orders

## 2022-11-26 NOTE — Progress Notes (Signed)
[facility] [phone]   [phone]  OFFICE PROGRESS NOTE  Farris Has, MD [address] [place] [address]  DIAGNOSIS: Essential thrombocythemia diagnosed in March 2022.  PRIOR THERAPY: None  CURRENT THERAPY: Hydroxyurea 500 mg p.o. daily.  Started October 23, 2020.    INTERVAL HISTORY: Ann [name] 77 y.o. female returns to the clinic today for follow-up visit.  The patient is feeling fine today with no concerning complaints.  She denied having any current nausea, vomiting, diarrhea or constipation.  She has no headache or visual changes.  She denied having any significant weight loss or night sweats.  Her diabetic medication was switched from Trulicity to Ozempic.  She has some abdominal discomfort with the new medication.  She has been tolerating her treatment with hydroxyurea fairly well.  She denied having any bleeding, bruises or ecchymosis.  She is here today for evaluation and repeat blood work.  MEDICAL HISTORY: Past Medical History:  Diagnosis Date   Diabetes mellitus without complication ([facility])    Hypertension     ALLERGIES:  is allergic to hydrocodone and sulfa antibiotics.  MEDICATIONS:  Current Outpatient Medications  Medication Sig Dispense Refill   allopurinol (ZYLOPRIM) 100 MG tablet Take 200 mg by mouth at bedtime.      amLODipine (NORVASC) 10 MG tablet TAKE 1 TABLET BY MOUTH EVERY DAY 90 tablet 0   atenolol (TENORMIN) 50 MG tablet Take 50 mg by mouth daily. Unknown dose     Blood Glucose Monitoring Suppl (ACCU-CHEK GUIDE) w/Device KIT Use as instructed to check blood sugar 2X daily. 1 kit 0   clonazePAM (KLONOPIN) 0.5 MG tablet Take 0.5 mg by mouth daily as needed.     COLCRYS 0.6 MG tablet Take 0.6 mg by mouth daily as needed (for gout).     Dulaglutide (TRULICITY) 0.75 MG/0.5ML SOPN Inject 0.75 mg into the skin once a week. 2 mL 5   estradiol (VIVELLE-DOT) 0.075 MG/24HR Place 1 patch onto  the skin 2 (two) times a week.     fluticasone (FLONASE) 50 MCG/ACT nasal spray      glimepiride (AMARYL) 4 MG tablet TAKE 1 TABLET BEFORE BREAKFAST AND 1 TABLET BEFORE DINNER 180 tablet 1   glucose blood (FREESTYLE TEST STRIPS) test strip Use 2x a day 100 each 11   hydroxyurea (HYDREA) 500 MG capsule TAKE 1 CAPSULE (500 MG TOTAL) BY MOUTH DAILY. MAY TAKE WITH FOOD TO MINIMIZE GI SIDE EFFECTS. 90 capsule 1   metFORMIN (GLUCOPHAGE-XR) 500 MG 24 hr tablet TAKE 1 TABLET BY MOUTH 2 TIMES DAILY WITH A MEAL. 180 tablet 3   montelukast (SINGULAIR) 10 MG tablet Take 10 mg by mouth at bedtime. Unknown dose     olmesartan (BENICAR) 20 MG tablet Take 20 mg by mouth daily.  0   pantoprazole (PROTONIX) 20 MG tablet Take 20 mg by mouth daily.     pravastatin (PRAVACHOL) 10 MG tablet Take 1 tablet (10 mg total) by mouth daily. 90 tablet 3   Semaglutide,0.25 or 0.5MG /DOS, 2 MG/3ML SOPN Inject 0.5 mg into the skin once a week. 3 mL 5   No current facility-administered medications for this visit.    SURGICAL HISTORY:  Past Surgical History:  Procedure Laterality Date   ABDOMINAL HYSTERECTOMY     knee surgery  04/2013    REVIEW OF SYSTEMS:  A comprehensive review of systems was negative.   PHYSICAL EXAMINATION: General appearance: alert, cooperative, and  no distress Head: Normocephalic, without obvious abnormality, atraumatic Neck: no adenopathy, no JVD, supple, symmetrical, trachea midline, and thyroid not enlarged, symmetric, no tenderness/mass/nodules Lymph nodes: Cervical, supraclavicular, and axillary nodes normal. Resp: clear to auscultation bilaterally Back: symmetric, no curvature. ROM normal. No CVA tenderness. Cardio: regular rate and rhythm, S1, S2 normal, no murmur, click, rub or gallop GI: soft, non-tender; bowel sounds normal; no masses,  no organomegaly Extremities: extremities normal, atraumatic, no cyanosis or edema  ECOG PERFORMANCE STATUS: 0 - Asymptomatic  There were no vitals  taken for this visit.  LABORATORY DATA: Lab Results  Component Value Date   WBC 9.3 11/26/2022   HGB 12.7 11/26/2022   HCT 39.2 11/26/2022   MCV 89.1 11/26/2022   PLT 417 (H) 11/26/2022      Chemistry      Component Value Date/Time   NA 138 11/26/2022 0850   K 3.6 11/26/2022 0850   CL 101 11/26/2022 0850   CO2 28 11/26/2022 0850   BUN 25 (H) 11/26/2022 0850   CREATININE 1.11 (H) 11/26/2022 0850      Component Value Date/Time   CALCIUM 9.8 11/26/2022 0850   ALKPHOS 78 11/26/2022 0850   AST 23 11/26/2022 0850   ALT 23 11/26/2022 0850   BILITOT 0.8 11/26/2022 0850       RADIOGRAPHIC STUDIES: No results found.  ASSESSMENT AND PLAN: This is a very pleasant 77 years old African-American female diagnosed with essential thrombocythemia in March 2022 with positive JAK2 mutation. The patient is currently on hydroxyurea 500 mg p.o. daily and has been tolerating it fairly well. The patient has been tolerating this treatment well with no concerning adverse effects. Repeat CBC today showed platelet count of 417,000 with normal hemoglobin and hematocrit as well as total white blood count. I recommended for her to continue her current treatment with hydroxyurea 500 mg p.o. daily. I will see her back for follow-up visit in 3 months for evaluation and repeat blood work. She was advised to call immediately if she has any other concerning symptoms in the interval. The patient voices understanding of current disease status and treatment options and is in agreement with the current care plan.  All questions were answered. The patient knows to call the clinic with any problems, questions or concerns. We can certainly see the patient much sooner if necessary.  Disclaimer: This note was dictated with voice recognition software. Similar sounding words can inadvertently be transcribed and may not be corrected upon review.

## 2022-12-16 ENCOUNTER — Other Ambulatory Visit: Payer: Self-pay | Admitting: Internal Medicine

## 2022-12-16 DIAGNOSIS — D473 Essential (hemorrhagic) thrombocythemia: Secondary | ICD-10-CM

## 2022-12-16 DIAGNOSIS — L988 Other specified disorders of the skin and subcutaneous tissue: Secondary | ICD-10-CM | POA: Diagnosis not present

## 2023-01-06 ENCOUNTER — Other Ambulatory Visit: Payer: Self-pay | Admitting: Internal Medicine

## 2023-01-18 DIAGNOSIS — L988 Other specified disorders of the skin and subcutaneous tissue: Secondary | ICD-10-CM | POA: Diagnosis not present

## 2023-02-11 ENCOUNTER — Ambulatory Visit: Payer: Medicare PPO | Admitting: Internal Medicine

## 2023-02-11 ENCOUNTER — Encounter: Payer: Self-pay | Admitting: Internal Medicine

## 2023-02-11 VITALS — BP 124/70 | HR 73 | Ht 62.0 in | Wt 158.6 lb

## 2023-02-11 DIAGNOSIS — E785 Hyperlipidemia, unspecified: Secondary | ICD-10-CM

## 2023-02-11 DIAGNOSIS — Z7984 Long term (current) use of oral hypoglycemic drugs: Secondary | ICD-10-CM

## 2023-02-11 DIAGNOSIS — R809 Proteinuria, unspecified: Secondary | ICD-10-CM

## 2023-02-11 DIAGNOSIS — Z7985 Long-term (current) use of injectable non-insulin antidiabetic drugs: Secondary | ICD-10-CM

## 2023-02-11 DIAGNOSIS — E119 Type 2 diabetes mellitus without complications: Secondary | ICD-10-CM

## 2023-02-11 DIAGNOSIS — E1129 Type 2 diabetes mellitus with other diabetic kidney complication: Secondary | ICD-10-CM | POA: Diagnosis not present

## 2023-02-11 LAB — HEMOGLOBIN A1C: Hemoglobin A1C: 6.7

## 2023-02-11 NOTE — Patient Instructions (Addendum)
Please continue: - Amaryl 4 mg before dinner - Ozempic 0.5 mg weekly  Stop Metfomin for now. If you still have abdominal discomfort, try Mylanta.   Please return in 4 months with your sugar log.

## 2023-02-11 NOTE — Progress Notes (Signed)
Patient ID: Ann [name], female   DOB: [birth year], 77 y.o.   MRN: [id number]   HPI: Ann [name] is a 77 y.o.-year-old female, initially referred by Dr. Kateri Plummer, now returning for f/u for DM2, dx in 2006, non-insulin-dependent, uncontrolled, with complications (microalbuminuria). Last visit 4 months ago. PCP: DR. Farris Has.  Interim history: No increased urination, blurry vision, nausea, chest pain.   She does have itching, which she attributes this to Essential Thrombocytopenia. Diarrhea improved  after reducing the metformin dose, but not resolved completely. This is now worse the day after the injection: some cramps, soft stools, some fatigue. She is happy that she lost 6 pounds since last visit  Reviewed HbA1c levels: Lab Results  Component Value Date   HGBA1C 7.9 (A) 10/15/2022   HGBA1C 7.0 (A) 06/15/2022   HGBA1C 6.4 (A) 02/12/2022   HGBA1C 6.3 (A) 10/07/2021   HGBA1C 6.5 (A) 06/09/2021   HGBA1C 7.0 (A) 02/10/2021   HGBA1C 7.1 (A) 10/17/2020   HGBA1C 7.1 (A) 06/13/2020   HGBA1C 6.9 (A) 02/08/2020   HGBA1C 7.3 02/07/2020   HGBA1C 7.1 (A) 10/05/2019   HGBA1C 7.1 (A) 06/16/2019   HGBA1C 6.7 (A) 02/03/2019   HGBA1C 7.3 (A) 09/30/2018   HGBA1C 6.5 (A) 06/06/2018  02/25/2016: 11.2% Previously 9.7% Previously 8.9%  Pt is on a regimen of: - Metformin ER 500 mg 2x a day >> diarrhea >> 500 mg daily - Amaryl 4 mg before dinner >> >> 4 mg before D (forgot to increase the dose) - Trulicity 1.5 >> 3 >> 1.5 >> 0.75 mg weekly (was off for 3 weeks, then could only get the lower dose) >> off >> Ozempic 0.5 mg weekly She was on Metformin 1000 mg 2x a day >> diarrhea. She refused insulin.  She was not checking sugars at last visit.  Now checking 0 to once a day: - am:   120-130 >> 124-134 >> 122-150s >> 125-142 - 2h after b'fast: n/c - before lunch: n/c >> 136 (sick) >> n/c - 2h after lunch: n/c - before dinner: n/c >> 150-165 >> 150s >> 142-156 >> n/c - 2h after dinner:  165-187 >> <160 >> n/c >> 157-166, 209 (watermelon) - bedtime: 130s-160s, 201 >> n/c >> 150s-160 >> 152-160s >> n/c  - nighttime: n/c >> 143-179 >> n/c Lowest sugar was 90 >> 120 >> 122 >> 125; she has hypoglycemia awareness in the 70s. Highest sugar was 201 >> .Marland Kitchen. 150 >> 209. We tried a Dexcom CGM for her before but she is on hydroxyurea for essential thrombocytopenia and she had erratic values on it.  Glucometer: Freestyle Lite  Pt's meals are: - Breakfast: cereal, boiled egg, juice - Lunch: sandwich, salad - Dinner: meat + salad, potato - Snacks: 3 She saw nutrition in the past.  No CKD, last BUN/creatinine:  Lab Results  Component Value Date   BUN 25 (H) 11/26/2022   Lab Results  Component Value Date   CREATININE 1.11 (H) 11/26/2022  02/06/2021: 21/0.98, GFR 60, glucose 99 02/07/2020: BUN/creatinine 12/0.98 06/05/2019: BUN/creatinine 11/0.91, glucose 128, Calcium normal at 9.6 01/11/2018: CMP with a high glucose of 175.  BUN/creatinine normal 11/0.84, with the rest of the CMP being normal 06/30/2016: 14/0.92 02/25/2016: 16/0.92, EGFR 60/73. At that time, glucose was 259, with a sodium of 133.    He has a history of elevated ACR: Microalbumin Panel Reviewed date:02/16/2022 11:48:26 AM Interpretation:73.1 Performing Lab: Notes/Report: Testing Performed at: [facility], [address], [address], [place],  [place] [address]  MA/CR ratio 73.1 0.0-30.0 mg/G    UMA 9.06 0.00-1.90 mg/dL    UCR 371     6/96/7893: ACR 19.8 02/07/2020: ACR 39.5 Lab Results  Component Value Date   MICRALBCREAT 1.7 09/30/2018  05/28/2015: Urine ACR 45.6 On olmesartan.  + HL;last set of lipids: Lipid Panel w/reflex Reviewed date:08/21/2022 05:25:14 PM Interpretation:Trig 229 Performing Lab: Notes/Report: Testing Performed at: [facility], [address], [address], [place], [place] [address]  Cholesterol 153 <200 mg/dL    CHOL/HDL 3.8 5.1-0.2 Ratio    HDLD 41 30-85 mg/dL Values below 40  mg/dL indicate increased risk factor  Triglyceride 229 0-199 mg/dL    NHDL 585 2-778 mg/dL Range dependent upon risk factors.  LDL Chol Calc (NIH) 75 0-99 mg/dL    No results found for: "CHOL", "HDL", "LDLCALC", "LDLDIRECT", "TRIG", "CHOLHDL" She started the Pravastatin 10 mg In 01/2018.  - last eye exam was on 11/10/2022: No DR, + macular degeneration.  She had laser surgery OS.  Dr. Della Goo.  - no numbness and tingling in her feet.  Saw podiatry in 05/2021 for an melanonychia  -considered to be due to the hydroxyurea.  Last foot exam 05/2022.  She has essential thrombocytemia.  Latest TSH was normal: Component Value Reference Range Notes  Thyroid Diagnostic [facility] Reviewed date:08/21/2022 05:23:07 PM Interpretation:2.26 Performing Lab: Notes/Report: Testing Performed at: [facility], [address], [address], [place], [place] [address]  TSH 2.26 0.34-4.50 UlU/mL     ROS: + see HPI  I reviewed pt's medications, allergies, PMH, social hx, family hx, and changes were documented in the history of present illness. Otherwise, unchanged from my initial visit note.  Past Medical History:  Diagnosis Date   Diabetes mellitus without complication (HCC)    Hypertension    Past Surgical History:  Procedure Laterality Date   ABDOMINAL HYSTERECTOMY     knee surgery  04/2013   Social History   Social History   Marital status: Married    Spouse name: Ann [name]   Number of children: 3   Occupational History   Retired Programmer, systems   Social History Main Topics   Smoking status: Never Smoker   Smokeless tobacco: No   Alcohol use No   Drug use: no   Current Outpatient Medications on File Prior to Visit  Medication Sig Dispense Refill   allopurinol (ZYLOPRIM) 100 MG tablet Take 200 mg by mouth at bedtime.      amLODipine (NORVASC) 10 MG tablet TAKE 1 TABLET BY MOUTH EVERY DAY 90 tablet 0   atenolol (TENORMIN) 50 MG tablet Take 50 mg by mouth daily. Unknown dose     Blood Glucose  Monitoring Suppl (ACCU-CHEK GUIDE) w/Device KIT Use as instructed to check blood sugar 2X daily. 1 kit 0   clonazePAM (KLONOPIN) 0.5 MG tablet Take 0.5 mg by mouth daily as needed.     COLCRYS 0.6 MG tablet Take 0.6 mg by mouth daily as needed (for gout).     estradiol (VIVELLE-DOT) 0.075 MG/24HR Place 1 patch onto the skin 2 (two) times a week.     fluticasone (FLONASE) 50 MCG/ACT nasal spray      glimepiride (AMARYL) 4 MG tablet TAKE 1 TABLET BEFORE BREAKFAST AND 1 TABLET BEFORE DINNER 180 tablet 1   glucose blood (FREESTYLE TEST STRIPS) test strip Use 2x a day 100 each 11   hydroxyurea (HYDREA) 500 MG capsule TAKE 1 CAPSULE (500 MG TOTAL) BY MOUTH DAILY. MAY TAKE WITH FOOD TO  MINIMIZE GI SIDE EFFECTS. 90 capsule 1   metFORMIN (GLUCOPHAGE-XR) 500 MG 24 hr tablet TAKE 1 TABLET BY MOUTH 2 TIMES DAILY WITH A MEAL. 180 tablet 3   montelukast (SINGULAIR) 10 MG tablet Take 10 mg by mouth at bedtime. Unknown dose     olmesartan-hydrochlorothiazide (BENICAR HCT) 40-12.5 MG tablet Take 1 tablet by mouth daily.     pantoprazole (PROTONIX) 20 MG tablet Take 20 mg by mouth daily.     pravastatin (PRAVACHOL) 10 MG tablet Take 1 tablet (10 mg total) by mouth daily. 90 tablet 3   Semaglutide,0.25 or 0.5MG /DOS, 2 MG/3ML SOPN Inject 0.5 mg into the skin once a week. 3 mL 5   No current facility-administered medications on file prior to visit.   Allergies  Allergen Reactions   Hydrocodone Itching   Sulfa Antibiotics     FH: - Diabetes in mother, sister, brother - Hypertension in mother and sister - Hyperlipidemia in brother  PE: BP 124/70   Pulse 73   Ht 5\' 2"  (1.575 m)   Wt 158 lb 9.6 oz (71.9 kg)   SpO2 98%   BMI 29.01 kg/m  Wt Readings from Last 3 Encounters:  02/11/23 158 lb 9.6 oz (71.9 kg)  10/15/22 164 lb 12.8 oz (74.8 kg)  08/27/22 163 lb 14.4 oz (74.3 kg)   Constitutional: normal weight, in NAD Eyes: no exophthalmos ENT: no thyromegaly, no cervical lymphadenopathy Cardiovascular:  RRR, No MRG Respiratory: CTA B Musculoskeletal: no deformities Skin: no rashes Neurological: no tremor with outstretched hands  ASSESSMENT: 1. DM2, non-insulin-dependent, uncontrolled, with complications - microalbuminuria  2. HL  3. MAU  PLAN:  1. Patient with longstanding, previously uncontrolled type 2 diabetes, with improvement in control after adding Trulicity to her overall antidiabetic regimen.  Before last visit, she unfortunately had to, due to [employer] of Trulicity.  I sent the prescription for Ozempic to her pharmacy but I advised her that if this was not covered, to try to increase her Amaryl dose instead.  HbA1c at last visit was higher, at 7.9%. -At today's visit, sugars are at or slightly above target in the morning and they remain mostly controlled later in the day except after eating fruit (watermelon), when she had a blood sugar in the low 200s.  Ozempic appears to be working well for her, but she does have loose stools/diarrhea in the day after taking the medication and she also has more fatigue that day.  This could be related to dehydration.  She is trying to stay hydrated.  For now, I advised her to try to stop metformin to avoid further diarrhea.  We also discussed that if the symptoms persist, she can try Mylanta.  Another option is to back off the Ozempic, but I am hoping that we do not absolutely need to do so.  Will continue Amaryl for now. - I suggested to:  Patient Instructions  Please continue: - Amaryl 4 mg before dinner - Ozempic 0.5 mg weekly  Stop Metfomin for now. If you still have abdominal discomfort, try Mylanta.   Please return in 4 months with your sugar log.   - we checked her HbA1c: 6.7% (lower) - advised to check sugars at different times of the day - 1x a day, rotating check times - advised for yearly eye exams >> she is UTD - return to clinic in 4 months  2. HL -Reviewed latest lipid panel from  07/2022: LDL only slightly  elevated, triglycerides high: No results  found for: "CHOL", "HDL", "LDLCALC", "LDLDIRECT", "TRIG", "CHOLHDL" -Continues pravastatin 10 mg daily without side effects  3. MAU -Patient has a history of microalbuminuria, with previous fluctuating levels, with the latest being 73.1 in 01/2022 -She is on oral losartan 20 mg daily -Will repeat this at next visit  Carlus Pavlov, MD PhD [facility]

## 2023-02-23 DIAGNOSIS — Z0000 Encounter for general adult medical examination without abnormal findings: Secondary | ICD-10-CM | POA: Diagnosis not present

## 2023-02-23 DIAGNOSIS — I700 Atherosclerosis of aorta: Secondary | ICD-10-CM | POA: Diagnosis not present

## 2023-02-23 DIAGNOSIS — F411 Generalized anxiety disorder: Secondary | ICD-10-CM | POA: Diagnosis not present

## 2023-02-23 DIAGNOSIS — N1831 Chronic kidney disease, stage 3a: Secondary | ICD-10-CM | POA: Diagnosis not present

## 2023-02-23 DIAGNOSIS — E1122 Type 2 diabetes mellitus with diabetic chronic kidney disease: Secondary | ICD-10-CM | POA: Diagnosis not present

## 2023-02-23 DIAGNOSIS — D473 Essential (hemorrhagic) thrombocythemia: Secondary | ICD-10-CM | POA: Diagnosis not present

## 2023-02-23 DIAGNOSIS — E785 Hyperlipidemia, unspecified: Secondary | ICD-10-CM | POA: Diagnosis not present

## 2023-02-23 DIAGNOSIS — E1165 Type 2 diabetes mellitus with hyperglycemia: Secondary | ICD-10-CM | POA: Diagnosis not present

## 2023-02-23 DIAGNOSIS — I10 Essential (primary) hypertension: Secondary | ICD-10-CM | POA: Diagnosis not present

## 2023-02-25 ENCOUNTER — Inpatient Hospital Stay: Payer: Medicare PPO | Admitting: Internal Medicine

## 2023-02-25 ENCOUNTER — Other Ambulatory Visit: Payer: Self-pay

## 2023-02-25 ENCOUNTER — Inpatient Hospital Stay: Payer: Medicare PPO | Attending: Internal Medicine

## 2023-02-25 VITALS — BP 139/70 | HR 68 | Temp 97.5°F | Resp 17 | Ht 62.0 in

## 2023-02-25 DIAGNOSIS — D473 Essential (hemorrhagic) thrombocythemia: Secondary | ICD-10-CM

## 2023-02-25 LAB — CMP (CANCER CENTER ONLY)
ALT: 21 U/L (ref 0–44)
AST: 22 U/L (ref 15–41)
Albumin: 4.2 g/dL (ref 3.5–5.0)
Alkaline Phosphatase: 72 U/L (ref 38–126)
Anion gap: 8 (ref 5–15)
BUN: 13 mg/dL (ref 8–23)
CO2: 30 mmol/L (ref 22–32)
Calcium: 9.1 mg/dL (ref 8.9–10.3)
Chloride: 101 mmol/L (ref 98–111)
Creatinine: 1.13 mg/dL — ABNORMAL HIGH (ref 0.44–1.00)
GFR, Estimated: 50 mL/min — ABNORMAL LOW (ref >60)
Glucose, Bld: 153 mg/dL — ABNORMAL HIGH (ref 70–99)
Potassium: 3.4 mmol/L — ABNORMAL LOW (ref 3.5–5.1)
Sodium: 139 mmol/L (ref 135–145)
Total Bilirubin: 0.6 mg/dL (ref 0.3–1.2)
Total Protein: 8.5 g/dL — ABNORMAL HIGH (ref 6.5–8.1)

## 2023-02-25 LAB — LACTATE DEHYDROGENASE: LDH: 148 U/L (ref 98–192)

## 2023-02-25 LAB — CBC WITH DIFFERENTIAL (CANCER CENTER ONLY)
Abs Immature Granulocytes: 0.03 10*3/uL (ref 0.00–0.07)
Basophils Absolute: 0.1 10*3/uL (ref 0.0–0.1)
Basophils Relative: 1 %
Eosinophils Absolute: 0.1 10*3/uL (ref 0.0–0.5)
Eosinophils Relative: 1 %
HCT: 37.7 % (ref 36.0–46.0)
Hemoglobin: 12.3 g/dL (ref 12.0–15.0)
Immature Granulocytes: 0 %
Lymphocytes Relative: 20 %
Lymphs Abs: 1.8 10*3/uL (ref 0.7–4.0)
MCH: 29.2 pg (ref 26.0–34.0)
MCHC: 32.6 g/dL (ref 30.0–36.0)
MCV: 89.5 fL (ref 80.0–100.0)
Monocytes Absolute: 0.5 10*3/uL (ref 0.1–1.0)
Monocytes Relative: 5 %
Neutro Abs: 6.4 10*3/uL (ref 1.7–7.7)
Neutrophils Relative %: 73 %
Platelet Count: 435 10*3/uL — ABNORMAL HIGH (ref 150–400)
RBC: 4.21 MIL/uL (ref 3.87–5.11)
RDW: 14.7 % (ref 11.5–15.5)
WBC Count: 9 10*3/uL (ref 4.0–10.5)
nRBC: 0 % (ref 0.0–0.2)

## 2023-02-25 NOTE — Progress Notes (Signed)
[facility] [phone]   Fax:([phone]  OFFICE PROGRESS NOTE  Farris Has, MD [address] [place] [address]  DIAGNOSIS: Essential thrombocythemia diagnosed in March 2022.  PRIOR THERAPY: None  CURRENT THERAPY: Hydroxyurea 500 mg p.o. daily.  Started October 23, 2020.    INTERVAL HISTORY: Ann [name] 77 y.o. female returns to the clinic today for follow-up visit.  The patient is feeling fine today with no concerning complaints except for occasional epigastric pain.  She denied having any current chest pain, shortness of breath, cough or hemoptysis.  She has no nausea, vomiting, diarrhea or constipation.  She has no headache or visual changes.  She has no recent weight loss or night sweats.  She has no bleeding, bruises or ecchymosis.  She continues to tolerate her treatment with hydroxyurea fairly well.  MEDICAL HISTORY: Past Medical History:  Diagnosis Date   Diabetes mellitus without complication (HCC)    Hypertension     ALLERGIES:  is allergic to hydrocodone and sulfa antibiotics.  MEDICATIONS:  Current Outpatient Medications  Medication Sig Dispense Refill   allopurinol (ZYLOPRIM) 100 MG tablet Take 200 mg by mouth at bedtime.      amLODipine (NORVASC) 10 MG tablet TAKE 1 TABLET BY MOUTH EVERY DAY 90 tablet 0   atenolol (TENORMIN) 50 MG tablet Take 50 mg by mouth daily. Unknown dose     Blood Glucose Monitoring Suppl (ACCU-CHEK GUIDE) w/Device KIT Use as instructed to check blood sugar 2X daily. 1 kit 0   clonazePAM (KLONOPIN) 0.5 MG tablet Take 0.5 mg by mouth daily as needed.     COLCRYS 0.6 MG tablet Take 0.6 mg by mouth daily as needed (for gout).     estradiol (VIVELLE-DOT) 0.075 MG/24HR Place 1 patch onto the skin 2 (two) times a week.     fluticasone (FLONASE) 50 MCG/ACT nasal spray      glimepiride (AMARYL) 4 MG tablet TAKE 1 TABLET BEFORE BREAKFAST AND 1 TABLET BEFORE DINNER 180 tablet 1    glucose blood (FREESTYLE TEST STRIPS) test strip Use 2x a day 100 each 11   hydroxyurea (HYDREA) 500 MG capsule TAKE 1 CAPSULE (500 MG TOTAL) BY MOUTH DAILY. MAY TAKE WITH FOOD TO MINIMIZE GI SIDE EFFECTS. 90 capsule 1   montelukast (SINGULAIR) 10 MG tablet Take 10 mg by mouth at bedtime. Unknown dose     olmesartan-hydrochlorothiazide (BENICAR HCT) 40-12.5 MG tablet Take 1 tablet by mouth daily.     pantoprazole (PROTONIX) 20 MG tablet Take 20 mg by mouth daily.     pravastatin (PRAVACHOL) 10 MG tablet Take 1 tablet (10 mg total) by mouth daily. 90 tablet 3   Semaglutide,0.25 or 0.5MG /DOS, 2 MG/3ML SOPN Inject 0.5 mg into the skin once a week. 3 mL 5   No current facility-administered medications for this visit.    SURGICAL HISTORY:  Past Surgical History:  Procedure Laterality Date   ABDOMINAL HYSTERECTOMY     knee surgery  04/2013    REVIEW OF SYSTEMS:  A comprehensive review of systems was negative except for: Gastrointestinal: positive for dyspepsia   PHYSICAL EXAMINATION: General appearance: alert, cooperative, and no distress Head: Normocephalic, without obvious abnormality, atraumatic Neck: no adenopathy, no JVD, supple, symmetrical, trachea midline, and thyroid not enlarged, symmetric, no tenderness/mass/nodules Lymph nodes: Cervical, supraclavicular, and axillary nodes normal. Resp: clear to auscultation bilaterally Back: symmetric, no curvature. ROM normal. No CVA tenderness. Cardio: regular rate and rhythm,  S1, S2 normal, no murmur, click, rub or gallop GI: soft, non-tender; bowel sounds normal; no masses,  no organomegaly Extremities: extremities normal, atraumatic, no cyanosis or edema  ECOG PERFORMANCE STATUS: 0 - Asymptomatic  Blood pressure 139/70, pulse 68, temperature (!) 97.5 F (36.4 C), temperature source Oral, resp. rate 17, height 5\' 2"  (1.575 m), SpO2 100%.  LABORATORY DATA: Lab Results  Component Value Date   WBC 9.0 02/25/2023   HGB 12.3 02/25/2023    HCT 37.7 02/25/2023   MCV 89.5 02/25/2023   PLT 435 (H) 02/25/2023      Chemistry      Component Value Date/Time   NA 139 02/25/2023 0844   K 3.4 (L) 02/25/2023 0844   CL 101 02/25/2023 0844   CO2 30 02/25/2023 0844   BUN 13 02/25/2023 0844   CREATININE 1.13 (H) 02/25/2023 0844      Component Value Date/Time   CALCIUM 9.1 02/25/2023 0844   ALKPHOS 72 02/25/2023 0844   AST 22 02/25/2023 0844   ALT 21 02/25/2023 0844   BILITOT 0.6 02/25/2023 0844       RADIOGRAPHIC STUDIES: No results found.  ASSESSMENT AND PLAN: This is a very pleasant 77 years old African-American female diagnosed with essential thrombocythemia in March 2022 with positive JAK2 mutation. The patient is currently on hydroxyurea 500 mg p.o. daily and has been tolerating it fairly well. The patient is feeling fine today with no concerning complaints. Repeat CBC showed mildly elevated platelets count up to 435,000. I recommended for her to continue her current treatment with hydroxyurea with the same dose. I will see her back for follow-up visit in 3 months for evaluation and repeat blood work.  If she continues to have increased and the platelets count, I may increase her dose of hydroxyurea. The patient was advised to call immediately if she has any other concerning symptoms in the interval. The patient voices understanding of current disease status and treatment options and is in agreement with the current care plan.  All questions were answered. The patient knows to call the clinic with any problems, questions or concerns. We can certainly see the patient much sooner if necessary.  Disclaimer: This note was dictated with voice recognition software. Similar sounding words can inadvertently be transcribed and may not be corrected upon review.

## 2023-03-11 DIAGNOSIS — E119 Type 2 diabetes mellitus without complications: Secondary | ICD-10-CM | POA: Diagnosis not present

## 2023-03-11 DIAGNOSIS — J069 Acute upper respiratory infection, unspecified: Secondary | ICD-10-CM | POA: Diagnosis not present

## 2023-03-11 DIAGNOSIS — I10 Essential (primary) hypertension: Secondary | ICD-10-CM | POA: Diagnosis not present

## 2023-03-11 DIAGNOSIS — D471 Chronic myeloproliferative disease: Secondary | ICD-10-CM | POA: Diagnosis not present

## 2023-04-01 ENCOUNTER — Other Ambulatory Visit: Payer: Self-pay | Admitting: Internal Medicine

## 2023-04-08 ENCOUNTER — Other Ambulatory Visit: Payer: Self-pay | Admitting: Internal Medicine

## 2023-04-19 DIAGNOSIS — R0789 Other chest pain: Secondary | ICD-10-CM | POA: Diagnosis not present

## 2023-05-04 DIAGNOSIS — Z23 Encounter for immunization: Secondary | ICD-10-CM | POA: Diagnosis not present

## 2023-05-27 ENCOUNTER — Other Ambulatory Visit: Payer: Medicare PPO

## 2023-05-27 ENCOUNTER — Ambulatory Visit: Payer: Medicare PPO | Admitting: Internal Medicine

## 2023-05-31 ENCOUNTER — Inpatient Hospital Stay: Payer: Medicare PPO

## 2023-05-31 ENCOUNTER — Inpatient Hospital Stay: Payer: Medicare PPO | Attending: Internal Medicine | Admitting: Internal Medicine

## 2023-05-31 VITALS — BP 144/71 | HR 73 | Temp 97.4°F | Resp 18 | Ht 62.0 in | Wt 158.1 lb

## 2023-05-31 DIAGNOSIS — D473 Essential (hemorrhagic) thrombocythemia: Secondary | ICD-10-CM | POA: Diagnosis not present

## 2023-05-31 DIAGNOSIS — D75839 Thrombocytosis, unspecified: Secondary | ICD-10-CM | POA: Diagnosis not present

## 2023-05-31 DIAGNOSIS — E119 Type 2 diabetes mellitus without complications: Secondary | ICD-10-CM | POA: Insufficient documentation

## 2023-05-31 DIAGNOSIS — I10 Essential (primary) hypertension: Secondary | ICD-10-CM | POA: Insufficient documentation

## 2023-05-31 LAB — CBC WITH DIFFERENTIAL (CANCER CENTER ONLY)
Abs Immature Granulocytes: 0.04 10*3/uL (ref 0.00–0.07)
Basophils Absolute: 0.1 10*3/uL (ref 0.0–0.1)
Basophils Relative: 2 %
Eosinophils Absolute: 0.2 10*3/uL (ref 0.0–0.5)
Eosinophils Relative: 2 %
HCT: 43.2 % (ref 36.0–46.0)
Hemoglobin: 13.7 g/dL (ref 12.0–15.0)
Immature Granulocytes: 1 %
Lymphocytes Relative: 22 %
Lymphs Abs: 1.9 10*3/uL (ref 0.7–4.0)
MCH: 28.1 pg (ref 26.0–34.0)
MCHC: 31.7 g/dL (ref 30.0–36.0)
MCV: 88.5 fL (ref 80.0–100.0)
Monocytes Absolute: 0.3 10*3/uL (ref 0.1–1.0)
Monocytes Relative: 4 %
Neutro Abs: 6.1 10*3/uL (ref 1.7–7.7)
Neutrophils Relative %: 69 %
Platelet Count: 414 10*3/uL — ABNORMAL HIGH (ref 150–400)
RBC: 4.88 MIL/uL (ref 3.87–5.11)
RDW: 13.9 % (ref 11.5–15.5)
WBC Count: 8.6 10*3/uL (ref 4.0–10.5)
nRBC: 0 % (ref 0.0–0.2)

## 2023-05-31 LAB — CMP (CANCER CENTER ONLY)
ALT: 45 U/L — ABNORMAL HIGH (ref 0–44)
AST: 37 U/L (ref 15–41)
Albumin: 4.4 g/dL (ref 3.5–5.0)
Alkaline Phosphatase: 81 U/L (ref 38–126)
Anion gap: 9 (ref 5–15)
BUN: 20 mg/dL (ref 8–23)
CO2: 28 mmol/L (ref 22–32)
Calcium: 9.6 mg/dL (ref 8.9–10.3)
Chloride: 100 mmol/L (ref 98–111)
Creatinine: 1.29 mg/dL — ABNORMAL HIGH (ref 0.44–1.00)
GFR, Estimated: 43 mL/min — ABNORMAL LOW (ref >60)
Glucose, Bld: 238 mg/dL — ABNORMAL HIGH (ref 70–99)
Potassium: 3.3 mmol/L — ABNORMAL LOW (ref 3.5–5.1)
Sodium: 137 mmol/L (ref 135–145)
Total Bilirubin: 1.1 mg/dL (ref <1.2)
Total Protein: 9.2 g/dL — ABNORMAL HIGH (ref 6.5–8.1)

## 2023-05-31 LAB — LACTATE DEHYDROGENASE: LDH: 183 U/L (ref 98–192)

## 2023-05-31 NOTE — Progress Notes (Signed)
[facility] [phone]   Fax:([phone]  OFFICE PROGRESS NOTE  Ann Has, Ann [name] [address] [place] [address]  DIAGNOSIS: Essential thrombocythemia diagnosed in March 2022.  PRIOR THERAPY: None  CURRENT THERAPY: Hydroxyurea 500 mg p.o. daily.  Started October 23, 2020.    INTERVAL HISTORY: Ann [name] 77 y.o. female is to the clinic today for 48-month follow-up visit.Discussed the use of AI scribe software for clinical note transcription with the patient, who gave verbal consent to proceed.  History of Present Illness   Ann [name], a 77 year old patient with a history of essential thrombocytopenia diagnosed in March 2022, [name] been on hydroxyurea 500 mg once daily since diagnosis. Over the past three months, the patient [name] been generally well with no significant health issues. However, she reports intermittent itching, particularly after showering and before bed. The patient denies any nausea or vomiting.  The patient notes a fluctuation in weight between 158 and 161 pounds, which she attributes to her diabetes medication, Zempion. She also reports a slightly elevated blood pressure reading of 144/71. Despite these minor issues, the patient's blood counts remain stable, with a platelet count of 414,000, similar to previous readings.  The patient [name] not experienced any significant weight loss, anemia, or drop in white blood cell count. She [name] been managing her symptoms and continuing with her daily activities without any major disruptions. The patient also reports using over-the-counter allergy medication to manage the itching associated with her condition.       MEDICAL HISTORY: Past Medical History:  Diagnosis Date   Diabetes mellitus without complication (HCC)    Hypertension     ALLERGIES:  is allergic to hydrocodone and sulfa antibiotics.  MEDICATIONS:  Current Outpatient Medications  Medication Sig  Dispense Refill   allopurinol (ZYLOPRIM) 100 MG tablet Take 200 mg by mouth at bedtime.      amLODipine (NORVASC) 10 MG tablet TAKE 1 TABLET BY MOUTH EVERY DAY 90 tablet 0   atenolol (TENORMIN) 50 MG tablet Take 50 mg by mouth daily. Unknown dose     Blood Glucose Monitoring Suppl (ACCU-CHEK GUIDE) w/Device KIT Use as instructed to check blood sugar 2X daily. 1 kit 0   clonazePAM (KLONOPIN) 0.5 MG tablet Take 0.5 mg by mouth daily as needed.     COLCRYS 0.6 MG tablet Take 0.6 mg by mouth daily as needed (for gout).     estradiol (VIVELLE-DOT) 0.075 MG/24HR Place 1 patch onto the skin 2 (two) times a week.     fluticasone (FLONASE) 50 MCG/ACT nasal spray      glimepiride (AMARYL) 4 MG tablet TAKE 1 TABLET BEFORE BREAKFAST AND 1 TABLET BEFORE DINNER 180 tablet 1   glucose blood (FREESTYLE TEST STRIPS) test strip Use 2x a day 100 each 11   hydroxyurea (HYDREA) 500 MG capsule TAKE 1 CAPSULE (500 MG TOTAL) BY MOUTH DAILY. MAY TAKE WITH FOOD TO MINIMIZE GI SIDE EFFECTS. 90 capsule 1   montelukast (SINGULAIR) 10 MG tablet Take 10 mg by mouth at bedtime. Unknown dose     olmesartan-hydrochlorothiazide (BENICAR HCT) 40-12.5 MG tablet Take 1 tablet by mouth daily.     pantoprazole (PROTONIX) 20 MG tablet Take 20 mg by mouth daily.     pravastatin (PRAVACHOL) 10 MG tablet Take 1 tablet (10 mg total) by mouth daily. 90 tablet 3   Semaglutide,0.25 or 0.5MG /DOS, (OZEMPIC, 0.25 OR 0.5 MG/DOSE,) 2 MG/3ML SOPN INJECT 0.5 MG INTO  THE SKIN ONE TIME PER WEEK 3 mL 5   No current facility-administered medications for this visit.    SURGICAL HISTORY:  Past Surgical History:  Procedure Laterality Date   ABDOMINAL HYSTERECTOMY     knee surgery  04/2013    REVIEW OF SYSTEMS:  A comprehensive review of systems was negative except for: Integument/breast: positive for pruritus   PHYSICAL EXAMINATION: General appearance: alert, cooperative, and no distress Head: Normocephalic, without obvious abnormality,  atraumatic Neck: no adenopathy, no JVD, supple, symmetrical, trachea midline, and thyroid not enlarged, symmetric, no tenderness/mass/nodules Lymph nodes: Cervical, supraclavicular, and axillary nodes normal. Resp: clear to auscultation bilaterally Back: symmetric, no curvature. ROM normal. No CVA tenderness. Cardio: regular rate and rhythm, S1, S2 normal, no murmur, click, rub or gallop GI: soft, non-tender; bowel sounds normal; no masses,  no organomegaly Extremities: extremities normal, atraumatic, no cyanosis or edema  ECOG PERFORMANCE STATUS: 0 - Asymptomatic  Blood pressure (!) 144/71, pulse 73, temperature (!) 97.4 F (36.3 C), temperature source Oral, resp. rate 18, height 5\' 2"  (1.575 m), weight 158 lb 1.6 oz (71.7 kg), SpO2 100%.  LABORATORY DATA: Lab Results  Component Value Date   WBC 8.6 05/31/2023   HGB 13.7 05/31/2023   HCT 43.2 05/31/2023   MCV 88.5 05/31/2023   PLT 414 (H) 05/31/2023      Chemistry      Component Value Date/Time   NA 139 02/25/2023 0844   K 3.4 (L) 02/25/2023 0844   CL 101 02/25/2023 0844   CO2 30 02/25/2023 0844   BUN 13 02/25/2023 0844   CREATININE 1.13 (H) 02/25/2023 0844      Component Value Date/Time   CALCIUM 9.1 02/25/2023 0844   ALKPHOS 72 02/25/2023 0844   AST 22 02/25/2023 0844   ALT 21 02/25/2023 0844   BILITOT 0.6 02/25/2023 0844       RADIOGRAPHIC STUDIES: No results found.  ASSESSMENT AND PLAN: This is a very pleasant 77 years old African-American female diagnosed with essential thrombocythemia in March 2022 with positive JAK2 mutation. The patient is currently on hydroxyurea 500 mg p.o. daily and [name] been tolerating it fairly well.    Essential Thrombocythemia Stable on hydroxyurea 500mg  daily. Platelet count 414,000, decreased from previous. No anemia or leukopenia. Intermittent pruritus, a known symptom of the disease. -Continue hydroxyurea 500mg  daily. -Over-the-counter antihistamines as needed for  pruritus. -Return in 4 months for follow-up and labs.  Hypertension Blood pressure slightly elevated at 144/71. -Monitor blood pressure.  Diabetes Stable on Zempion. No significant weight loss. -Continue Zempion as prescribed.   The patient was advised to call immediately if she [name] any other concerning symptoms in the interval. The patient voices understanding of current disease status and treatment options and is in agreement with the current care plan.  All questions were answered. The patient knows to call the clinic with any problems, questions or concerns. We can certainly see the patient much sooner if necessary.  Disclaimer: This note was dictated with voice recognition software. Similar sounding words can inadvertently be transcribed and may not be corrected upon review.

## 2023-06-01 ENCOUNTER — Encounter: Payer: Self-pay | Admitting: Internal Medicine

## 2023-06-01 ENCOUNTER — Ambulatory Visit: Payer: Medicare PPO | Admitting: Internal Medicine

## 2023-06-01 VITALS — BP 130/60 | HR 75 | Ht 62.0 in | Wt 161.2 lb

## 2023-06-01 DIAGNOSIS — Z7985 Long-term (current) use of injectable non-insulin antidiabetic drugs: Secondary | ICD-10-CM | POA: Diagnosis not present

## 2023-06-01 DIAGNOSIS — E785 Hyperlipidemia, unspecified: Secondary | ICD-10-CM | POA: Diagnosis not present

## 2023-06-01 DIAGNOSIS — R809 Proteinuria, unspecified: Secondary | ICD-10-CM | POA: Diagnosis not present

## 2023-06-01 DIAGNOSIS — E1129 Type 2 diabetes mellitus with other diabetic kidney complication: Secondary | ICD-10-CM

## 2023-06-01 DIAGNOSIS — Z7984 Long term (current) use of oral hypoglycemic drugs: Secondary | ICD-10-CM

## 2023-06-01 DIAGNOSIS — Z794 Long term (current) use of insulin: Secondary | ICD-10-CM

## 2023-06-01 LAB — HEMOGLOBIN A1C: Hemoglobin A1C: 9.4

## 2023-06-01 MED ORDER — LANTUS SOLOSTAR 100 UNIT/ML ~~LOC~~ SOPN: 14.0000 [IU] | PEN_INJECTOR | Freq: Every day | SUBCUTANEOUS | 11 refills | Status: DC

## 2023-06-01 MED ORDER — INSULIN PEN NEEDLE 32G X 4 MM MISC: 3 refills | Status: DC

## 2023-06-01 MED ORDER — OZEMPIC (1 MG/DOSE) 4 MG/3ML ~~LOC~~ SOPN: 1.0000 mg | PEN_INJECTOR | SUBCUTANEOUS | 3 refills | Status: DC

## 2023-06-01 NOTE — Patient Instructions (Addendum)
Please continue: - Amaryl 4 mg before dinner  Please increase: - Ozempic 1 mg weekly  Start: - Lantus 10 units at bedtime and increase by 3 units every 3 days until sugars in am <130 or you reach 30 units.   Please return in 1.5 months with your sugar log.

## 2023-06-01 NOTE — Progress Notes (Signed)
Patient ID: MELONI HINZ, female   DOB: [birth year], 77 y.o.   MRN: [id number]   HPI: Ann [name] is a 77 y.o.-year-old female, initially referred by Dr. Kateri Plummer, now returning for f/u for DM2, dx in 2006, non-insulin-dependent, uncontrolled, with complications (microalbuminuria). Last visit 4 months ago. PCP: DR. Farris Has.  Interim history: No increased urination, blurry vision, nausea, chest pain.   She does have itching, which she attributes this to Essential Thrombocytopenia. At today's visit, she relaxed her diet and start checking blood sugars.  Reviewed HbA1c levels: Lab Results  Component Value Date   HGBA1C 6.7 02/11/2023   HGBA1C 7.9 (A) 10/15/2022   HGBA1C 7.0 (A) 06/15/2022   HGBA1C 6.4 (A) 02/12/2022   HGBA1C 6.3 (A) 10/07/2021   HGBA1C 6.5 (A) 06/09/2021   HGBA1C 7.0 (A) 02/10/2021   HGBA1C 7.1 (A) 10/17/2020   HGBA1C 7.1 (A) 06/13/2020   HGBA1C 6.9 (A) 02/08/2020   HGBA1C 7.3 02/07/2020   HGBA1C 7.1 (A) 10/05/2019   HGBA1C 7.1 (A) 06/16/2019   HGBA1C 6.7 (A) 02/03/2019   HGBA1C 7.3 (A) 09/30/2018  02/25/2016: 11.2% Previously 9.7% Previously 8.9%  Pt is on a regimen of 5 - Amaryl 4 mg before dinner >> >> 4 mg before D - Trulicity 1.5 >> 3 >> 1.5 >> 0.75 mg weekly (was off for 3 weeks, then could only get the lower dose) >> off >> Ozempic 0.5 mg weekly She was on Metformin 1000 mg 2x a day >> diarrhea >> 500 mg daily >> diarrhea >> stopped She refused insulin initially.  She is not checking sugars.  Previously: - am:   120-130 >> 124-134 >> 122-150s >> 125-142 - 2h after b'fast: n/c - before lunch: n/c >> 136 (sick) >> n/c - 2h after lunch: n/c  - before dinner: n/c >> 150-165 >> 150s >> 142-156 >> n/c - 2h after dinner: 165-187 >> <160 >> n/c >> 157-166, 209 (watermelon) - bedtime: 130s-160s, 201 >> n/c >> 150s-160 >> 152-160s >> n/c  - nighttime: n/c >> 143-179 >> n/c Lowest sugar was 90 >> 120 >> 122 >> 125 >> ?; she has hypoglycemia  awareness in the 70s. Highest sugar was 201 >> .Marland Kitchen. 150 >> 209 >> ? We tried a Dexcom CGM for her before but she is on hydroxyurea for essential thrombocytopenia and she had erratic values on it.  Glucometer: Freestyle Lite  Pt's meals are: - Breakfast: cereal, boiled egg, juice - Lunch: sandwich, salad - Dinner: meat + salad, potato - Snacks: 3 She saw nutrition in the past.  No CKD, last BUN/creatinine:  Lab Results  Component Value Date   BUN 20 05/31/2023   Lab Results  Component Value Date   CREATININE 1.29 (H) 05/31/2023  02/06/2021: 21/0.98, GFR 60, glucose 99 02/07/2020: BUN/creatinine 12/0.98 06/05/2019: BUN/creatinine 11/0.91, glucose 128, Calcium normal at 9.6 01/11/2018: CMP with a high glucose of 175.  BUN/creatinine normal 11/0.84, with the rest of the CMP being normal 06/30/2016: 14/0.92 02/25/2016: 16/0.92, EGFR 60/73. At that time, glucose was 259, with a sodium of 133.    He has a history of elevated ACR:  Microalbumin Panel Reviewed date:02/16/2022 11:48:26 AM Interpretation:73.1 Performing Lab: Notes/Report: Testing Performed at: [facility], [address], [place], [place] [address]  MA/CR ratio 73.1 0.0-30.0 mg/G    UMA 9.06 0.00-1.90 mg/dL    UCR 846     9/62/9528: ACR 19.8 02/07/2020: ACR 39.5 Lab Results  Component Value Date   MICRALBCREAT 1.7  09/30/2018  05/28/2015: Urine ACR 45.6 On olmesartan.  + HL;last set of lipids:  Lipid Panel w/reflex Reviewed date:08/21/2022 05:25:14 PM Interpretation:Trig 229 Performing Lab: Notes/Report: Testing Performed at: [facility], [address], [address], [place], [place] [address]  Cholesterol 153 <200 mg/dL    CHOL/HDL 3.8 4.4-0.3 Ratio    HDLD 41 30-85 mg/dL Values below 40 mg/dL indicate increased risk factor  Triglyceride 229 0-199 mg/dL    NHDL 474 2-595 mg/dL Range dependent upon risk factors.  LDL Chol Calc (NIH) 75 0-99 mg/dL    No results found for: "CHOL", "HDL", "LDLCALC",  "LDLDIRECT", "TRIG", "CHOLHDL" She started the Pravastatin 10 mg In 01/2018.  - last eye exam was on 11/10/2022: No DR, + macular degeneration.  She had laser surgery OS.  Dr. Della Goo.  - no numbness and tingling in her feet.  Saw podiatry in 05/2021 for an melanonychia  -considered to be due to the hydroxyurea.  Last foot exam 06/15/2022.  She has essential thrombocytemia.  Latest TSH was normal: Component Value Reference Range Notes  Thyroid Diagnostic Cascade Reviewed date:08/21/2022 05:23:07 PM Interpretation:2.26 Performing Lab: Notes/Report: Testing Performed at: [facility], [address], [address], [place], [place] [address]  TSH 2.26 0.34-4.50 UlU/mL     ROS: + see HPI  I reviewed pt's medications, allergies, PMH, social hx, family hx, and changes were documented in the history of present illness. Otherwise, unchanged from my initial visit note.  Past Medical History:  Diagnosis Date   Diabetes mellitus without complication (HCC)    Hypertension    Past Surgical History:  Procedure Laterality Date   ABDOMINAL HYSTERECTOMY     knee surgery  04/2013   Social History   Social History   Marital status: Married    Spouse name: N/A   Number of children: 3   Occupational History   Retired Programmer, systems   Social History Main Topics   Smoking status: Never Smoker   Smokeless tobacco: No   Alcohol use No   Drug use: no   Current Outpatient Medications on File Prior to Visit  Medication Sig Dispense Refill   allopurinol (ZYLOPRIM) 100 MG tablet Take 200 mg by mouth at bedtime.      amLODipine (NORVASC) 10 MG tablet TAKE 1 TABLET BY MOUTH EVERY DAY 90 tablet 0   atenolol (TENORMIN) 50 MG tablet Take 50 mg by mouth daily. Unknown dose     Blood Glucose Monitoring Suppl (ACCU-CHEK GUIDE) w/Device KIT Use as instructed to check blood sugar 2X daily. 1 kit 0   clonazePAM (KLONOPIN) 0.5 MG tablet Take 0.5 mg by mouth daily as needed.     COLCRYS 0.6 MG tablet Take 0.6  mg by mouth daily as needed (for gout).     estradiol (VIVELLE-DOT) 0.075 MG/24HR Place 1 patch onto the skin 2 (two) times a week.     fluticasone (FLONASE) 50 MCG/ACT nasal spray      glimepiride (AMARYL) 4 MG tablet TAKE 1 TABLET BEFORE BREAKFAST AND 1 TABLET BEFORE DINNER 180 tablet 1   glucose blood (FREESTYLE TEST STRIPS) test strip Use 2x a day 100 each 11   hydroxyurea (HYDREA) 500 MG capsule TAKE 1 CAPSULE (500 MG TOTAL) BY MOUTH DAILY. MAY TAKE WITH FOOD TO MINIMIZE GI SIDE EFFECTS. 90 capsule 1   montelukast (SINGULAIR) 10 MG tablet Take 10 mg by mouth at bedtime. Unknown dose     olmesartan-hydrochlorothiazide (BENICAR HCT) 40-12.5 MG tablet Take 1 tablet by mouth daily.  pantoprazole (PROTONIX) 20 MG tablet Take 20 mg by mouth daily.     pravastatin (PRAVACHOL) 10 MG tablet Take 1 tablet (10 mg total) by mouth daily. 90 tablet 3   Semaglutide,0.25 or 0.5MG /DOS, (OZEMPIC, 0.25 OR 0.5 MG/DOSE,) 2 MG/3ML SOPN INJECT 0.5 MG INTO THE SKIN ONE TIME PER WEEK 3 mL 5   No current facility-administered medications on file prior to visit.   Allergies  Allergen Reactions   Hydrocodone Itching   Sulfa Antibiotics     FH: - Diabetes in mother, sister, brother - Hypertension in mother and sister - Hyperlipidemia in brother  PE: BP 130/60   Pulse 75   Ht 5\' 2"  (1.575 m)   Wt 161 lb 3.2 oz (73.1 kg)   SpO2 99%   BMI 29.48 kg/m  Wt Readings from Last 3 Encounters:  06/01/23 161 lb 3.2 oz (73.1 kg)  05/31/23 158 lb 1.6 oz (71.7 kg)  02/11/23 158 lb 9.6 oz (71.9 kg)   Constitutional: normal weight, in NAD Eyes: no exophthalmos ENT: no thyromegaly, no cervical lymphadenopathy Cardiovascular: RRR, No MRG Respiratory: CTA B Musculoskeletal: no deformities Skin: no rashes Neurological: no tremor with outstretched hands Diabetic Foot Exam - Simple   Simple Foot Form Diabetic Foot exam was performed with the following findings: Yes 06/01/2023  2:00 PM  Visual Inspection No  deformities, no ulcerations, no other skin breakdown bilaterally: Yes Sensation Testing Intact to touch and monofilament testing bilaterally: Yes Pulse Check Posterior Tibialis and Dorsalis pulse intact bilaterally: Yes Comments    ASSESSMENT: 1. DM2, non-insulin-dependent, uncontrolled, with complications - microalbuminuria  2. HL  3. MAU  PLAN:  1. Patient with longstanding, previously uncontrolled type 2 diabetes with improvement in control after adding a GLP-1 receptor agonist.  Due to the national shortage of Trulicity, unfortunately, she came off the medication but we were then able to start Ozempic.  At last visit, she had loose stool/diarrhea and also more fatigue.  I advised her to stop metformin to see if the symptoms could be related to this.  We continued Amaryl and Ozempic at the same dose.  We did discuss that if the symptoms persist, she could try Mylanta or we could decrease the dose of Ozempic.  HbA1c at that time was better, at 6.7%, decreased from 7.9%. -At today's visit, she is not checking blood sugars.  She tells me that she did not feel differently compared to before.  She also relaxed diet.  We checked a blood sugar in the office and this was 216, 4 hours after her breakfast.  At this point, we discussed about increasing the Ozempic dose but this would not be enough, especially with the holidays coming up.  I advised her to start a low-dose long-acting insulin and titrate up as needed.  I am hoping that we could stop the insulin as soon as possible in the future. - I suggested to:  Patient Instructions  Please continue: - Amaryl 4 mg before dinner  Please increase: - Ozempic 1 mg weekly  Start: - Lantus 10 units at bedtime and increase by 3 units every 3 days until sugars in am <130 or you reach 30 units.   Please return in 1.5 months with your sugar log.   - we checked her HbA1c: 9.4% (much higher) - advised to check sugars at different times of the day - 1x  a day, rotating check times - advised for yearly eye exams >> she is UTD - return to clinic  in 3 months  2. HL -Reviewed latest lipid panel from 02/2023: All fractions at goal No results found for: "CHOL", "HDL", "LDLCALC", "LDLDIRECT", "TRIG", "CHOLHDL" -She is on pravastatin 10 mg daily without side effects  3. MAU -Patient with a history of microalbuminuria, with previously fluctuating levels, 73.1 in 01/2022, but lower, at 44.2 on 03/03/2023 -will continue losartan 20 mg daily  Carlus Pavlov, MD PhD [facility] Endocrinology

## 2023-06-03 DIAGNOSIS — H43811 Vitreous degeneration, right eye: Secondary | ICD-10-CM | POA: Diagnosis not present

## 2023-06-03 DIAGNOSIS — H2513 Age-related nuclear cataract, bilateral: Secondary | ICD-10-CM | POA: Diagnosis not present

## 2023-06-03 DIAGNOSIS — H35371 Puckering of macula, right eye: Secondary | ICD-10-CM | POA: Diagnosis not present

## 2023-06-03 DIAGNOSIS — H35363 Drusen (degenerative) of macula, bilateral: Secondary | ICD-10-CM | POA: Diagnosis not present

## 2023-06-12 ENCOUNTER — Other Ambulatory Visit: Payer: Self-pay | Admitting: Internal Medicine

## 2023-06-12 DIAGNOSIS — D473 Essential (hemorrhagic) thrombocythemia: Secondary | ICD-10-CM

## 2023-07-07 ENCOUNTER — Other Ambulatory Visit: Payer: Self-pay | Admitting: Internal Medicine

## 2023-07-14 DIAGNOSIS — Z01419 Encounter for gynecological examination (general) (routine) without abnormal findings: Secondary | ICD-10-CM | POA: Diagnosis not present

## 2023-07-14 DIAGNOSIS — N952 Postmenopausal atrophic vaginitis: Secondary | ICD-10-CM | POA: Diagnosis not present

## 2023-07-14 DIAGNOSIS — Z6828 Body mass index (BMI) 28.0-28.9, adult: Secondary | ICD-10-CM | POA: Diagnosis not present

## 2023-07-14 DIAGNOSIS — Z1231 Encounter for screening mammogram for malignant neoplasm of breast: Secondary | ICD-10-CM | POA: Diagnosis not present

## 2023-07-19 ENCOUNTER — Encounter: Payer: Self-pay | Admitting: Internal Medicine

## 2023-07-19 ENCOUNTER — Ambulatory Visit: Payer: Medicare PPO | Admitting: Internal Medicine

## 2023-07-19 VITALS — BP 138/70 | HR 75 | Ht 60.0 in | Wt 161.6 lb

## 2023-07-19 DIAGNOSIS — Z7984 Long term (current) use of oral hypoglycemic drugs: Secondary | ICD-10-CM | POA: Diagnosis not present

## 2023-07-19 DIAGNOSIS — E1129 Type 2 diabetes mellitus with other diabetic kidney complication: Secondary | ICD-10-CM | POA: Diagnosis not present

## 2023-07-19 DIAGNOSIS — Z7985 Long-term (current) use of injectable non-insulin antidiabetic drugs: Secondary | ICD-10-CM | POA: Diagnosis not present

## 2023-07-19 DIAGNOSIS — E785 Hyperlipidemia, unspecified: Secondary | ICD-10-CM | POA: Diagnosis not present

## 2023-07-19 DIAGNOSIS — R809 Proteinuria, unspecified: Secondary | ICD-10-CM | POA: Diagnosis not present

## 2023-07-19 MED ORDER — GLIMEPIRIDE 4 MG PO TABS: ORAL_TABLET | ORAL | 3 refills | Status: AC

## 2023-07-19 MED ORDER — AMLODIPINE BESYLATE 10 MG PO TABS: 10.0000 mg | ORAL_TABLET | Freq: Every day | ORAL | 0 refills | Status: DC

## 2023-07-19 MED ORDER — LANTUS SOLOSTAR 100 UNIT/ML ~~LOC~~ SOPN: 16.0000 [IU] | PEN_INJECTOR | Freq: Every day | SUBCUTANEOUS | Status: DC

## 2023-07-19 NOTE — Progress Notes (Signed)
Patient ID: Ann [name], female   DOB: [birth year], 77 y.o.   MRN: [id number]   HPI: Ann [name] is a 77 y.o.-year-old female, initially referred by Dr. Kateri Plummer, now returning for f/u for DM2, dx in 2006, non-insulin-dependent, uncontrolled, with complications (microalbuminuria). Last visit 1.5 months ago. PCP: DR. Farris Has.  Interim history: No increased urination, blurry vision, nausea, chest pain.    Reviewed HbA1c levels: 06/01/2023: HbA1c 9.4% Lab Results  Component Value Date   HGBA1C 6.7 02/11/2023   HGBA1C 7.9 (A) 10/15/2022   HGBA1C 7.0 (A) 06/15/2022   HGBA1C 6.4 (A) 02/12/2022   HGBA1C 6.3 (A) 10/07/2021   HGBA1C 6.5 (A) 06/09/2021   HGBA1C 7.0 (A) 02/10/2021   HGBA1C 7.1 (A) 10/17/2020   HGBA1C 7.1 (A) 06/13/2020   HGBA1C 6.9 (A) 02/08/2020   HGBA1C 7.3 02/07/2020   HGBA1C 7.1 (A) 10/05/2019   HGBA1C 7.1 (A) 06/16/2019   HGBA1C 6.7 (A) 02/03/2019   HGBA1C 7.3 (A) 09/30/2018  02/25/2016: 11.2% Previously 9.7% Previously 8.9%  Pt is on a regimen of 5 - Amaryl >> 2 mg in am and 4 mg before D - Trulicity >> off >> Ozempic 0.5 >> 1 mg weekly - Lantus 10 >> 14 units daily  She was on Metformin 1000 mg 2x a day >> diarrhea >> 500 mg daily >> diarrhea >> stopped She refused insulin initially.  She is not checking sugars.  Previously: - am: 124-134 >> 122-150s >> 125-142 >> 134-163 - 2h after b'fast: n/c>> 206 - before lunch: n/c >> 136 (sick) >> n/c - 2h after lunch: n/c  - before dinner: 150-165 >> 150s >> 142-156 >> n/c >> 183 - 2h after dinner:  n/c >> 157-166, 209 >> 108, 198, 209 - bedtime: n/c >> 150s-160 >> 152-160s >> n/c  - nighttime: n/c >> 143-179 >> n/c Lowest sugar was 90 >> 120 >> 122 >> 125 >> 108; she has hypoglycemia awareness in the 70s. Highest sugar was 201 >> .Marland Kitchen. 150 >> 209 >> 209 We tried a Dexcom CGM for her before but she is on hydroxyurea for essential thrombocytopenia and she had erratic values on it.  Glucometer: Freestyle  Lite  Pt's meals are: - Breakfast: cereal, boiled egg, juice - Lunch: sandwich, salad - Dinner: meat + salad, potato - Snacks: 3 She saw nutrition in the past.  No CKD, last BUN/creatinine:  Lab Results  Component Value Date   BUN 20 05/31/2023   Lab Results  Component Value Date   CREATININE 1.29 (H) 05/31/2023   He has a history of elevated ACR: 03/03/2023: ACR 44.2 02/16/2022: ACR 73.1 02/06/2021: ACR 19.8 02/07/2020: ACR 39.5 Lab Results  Component Value Date   MICRALBCREAT 1.7 09/30/2018  05/28/2015: Urine ACR 45.6 On olmesartan.  + HL;last set of lipids:  No results found for: "CHOL", "HDL", "LDLCALC", "LDLDIRECT", "TRIG", "CHOLHDL" She started the Pravastatin 10 mg In 01/2018.  - last eye exam was on 11/10/2022: No DR, + macular degeneration.  She had laser surgery OS.  Dr. Della Goo.  - no numbness and tingling in her feet.  Saw podiatry in 05/2021 for an melanonychia  -considered to be due to the hydroxyurea.  Last foot exam 06/01/2023.  She has essential thrombocytemia.  Latest TSH was normal: Component Value Reference Range Notes  Thyroid Diagnostic Cascade Reviewed date:08/21/2022 05:23:07 PM Interpretation:2.26 Performing Lab: Notes/Report: Testing Performed at: [facility], [address], [place], [place] [address]  TSH 2.26 0.34-4.50 UlU/mL  ROS: + see HPI  I reviewed pt's medications, allergies, PMH, social hx, family hx, and changes were documented in the history of present illness. Otherwise, unchanged from my initial visit note.  Past Medical History:  Diagnosis Date   Diabetes mellitus without complication (HCC)    Hypertension    Past Surgical History:  Procedure Laterality Date   ABDOMINAL HYSTERECTOMY     knee surgery  04/2013   Social History   Social History   Marital status: Married    Spouse name: N/A   Number of children: 3   Occupational History   Retired Programmer, systems   Social History Main Topics   Smoking  status: Never Smoker   Smokeless tobacco: No   Alcohol use No   Drug use: no   Current Outpatient Medications on File Prior to Visit  Medication Sig Dispense Refill   allopurinol (ZYLOPRIM) 100 MG tablet Take 200 mg by mouth at bedtime.      amLODipine (NORVASC) 10 MG tablet TAKE 1 TABLET BY MOUTH EVERY DAY 90 tablet 0   atenolol (TENORMIN) 50 MG tablet Take 50 mg by mouth daily. Unknown dose     Blood Glucose Monitoring Suppl (ACCU-CHEK GUIDE) w/Device KIT Use as instructed to check blood sugar 2X daily. 1 kit 0   clonazePAM (KLONOPIN) 0.5 MG tablet Take 0.5 mg by mouth daily as needed.     COLCRYS 0.6 MG tablet Take 0.6 mg by mouth daily as needed (for gout).     estradiol (VIVELLE-DOT) 0.075 MG/24HR Place 1 patch onto the skin 2 (two) times a week.     fluticasone (FLONASE) 50 MCG/ACT nasal spray      glimepiride (AMARYL) 4 MG tablet TAKE 1 TABLET BEFORE BREAKFAST AND 1 TABLET BEFORE DINNER 180 tablet 1   glucose blood (FREESTYLE TEST STRIPS) test strip Use 2x a day 100 each 11   hydroxyurea (HYDREA) 500 MG capsule TAKE 1 CAPSULE (500 MG TOTAL) BY MOUTH DAILY. MAY TAKE WITH FOOD TO MINIMIZE GI SIDE EFFECTS. 90 capsule 1   insulin glargine (LANTUS SOLOSTAR) 100 UNIT/ML Solostar Pen Inject 14 Units into the skin at bedtime. 15 mL 11   Insulin Pen Needle 32G X 4 MM MISC Use 1x a day 100 each 3   montelukast (SINGULAIR) 10 MG tablet Take 10 mg by mouth at bedtime. Unknown dose     olmesartan-hydrochlorothiazide (BENICAR HCT) 40-12.5 MG tablet Take 1 tablet by mouth daily.     pantoprazole (PROTONIX) 20 MG tablet Take 20 mg by mouth daily.     pravastatin (PRAVACHOL) 10 MG tablet Take 1 tablet (10 mg total) by mouth daily. 90 tablet 3   Semaglutide, 1 MG/DOSE, (OZEMPIC, 1 MG/DOSE,) 4 MG/3ML SOPN Inject 1 mg into the skin once a week. 9 mL 3   No current facility-administered medications on file prior to visit.   Allergies  Allergen Reactions   Hydrocodone Itching   Sulfa Antibiotics      FH: - Diabetes in mother, sister, brother - Hypertension in mother and sister - Hyperlipidemia in brother  PE: BP 138/70   Pulse 75   Ht 5' (1.524 m)   Wt 161 lb 9.6 oz (73.3 kg)   SpO2 99%   BMI 31.56 kg/m  Wt Readings from Last 3 Encounters:  07/19/23 161 lb 9.6 oz (73.3 kg)  06/01/23 161 lb 3.2 oz (73.1 kg)  05/31/23 158 lb 1.6 oz (71.7 kg)   Constitutional: normal weight, in NAD Eyes: no exophthalmos ENT:  no thyromegaly, no cervical lymphadenopathy Cardiovascular: RRR, No MRG Respiratory: CTA B Musculoskeletal: no deformities Skin: no rashes Neurological: no tremor with outstretched hands  ASSESSMENT: 1. DM2, now.  Insulin-dependent, uncontrolled, with complications - microalbuminuria  2. HL  3. MAU  PLAN:  1. Patient with longstanding, uncontrolled type 2 diabetes, with improvement in control after adding a GLP-1 receptor agonist, but then with deteriorating control at last visit with an HbA1c increased to 9.4% after relaxing diet and stopping sugar checks.  At that time, we checked a blood sugar in the office and this was 216 fasting.  We increased her Ozempic dose and recommended long-acting insulin at a low dose and advised to increase it as needed. -At today's visit sugars appear to be improved from before.  They are still above target in the morning as she did not increase the Lantus significantly beyond the starting dose and we discussed about increasing this further.  However, due to the improvement in blood sugars, I did not recommend a change in the rest of her regimen.  I encouraged her to try to check blood sugars daily, rotating check times, since she did not check it frequently since last visit (we only have approximately 5 days of data). - I suggested to:  Patient Instructions  Please continue: - Amaryl 2 mg before b'fast and 4 mg dinner - Ozempic 1 mg weekly  Increase: - Lantus 16-18 units at bedtime    Please return in 2-3 months with your sugar  log.   - advised to check sugars at different times of the day - 2x a day, rotating check times - advised for yearly eye exams >> she is UTD - return to clinic in 2-3 months  2. HL -Reviewed latest lipid panel from 02/2023: All fractions at goal. No results found for: "CHOL", "HDL", "LDLCALC", "LDLDIRECT", "TRIG", "CHOLHDL" -She continues pravastatin 10 mg daily without side effect  3. MAU -Patient with a history of microalbuminuria, with previously fluctuating levels, 73.1 in 01/2022, but lower, at 44.2 on 03/03/2023 -Continue losartan 100 mg daily   Carlus Pavlov, MD PhD [facility] Endocrinology

## 2023-07-19 NOTE — Patient Instructions (Addendum)
Please continue: - Amaryl 2 mg before b'fast and 4 mg dinner - Ozempic 1 mg weekly  Increase: - Lantus 16-18 units at bedtime    Please return in 2-3 months with your sugar log.

## 2023-08-26 DIAGNOSIS — M109 Gout, unspecified: Secondary | ICD-10-CM | POA: Diagnosis not present

## 2023-08-26 DIAGNOSIS — D473 Essential (hemorrhagic) thrombocythemia: Secondary | ICD-10-CM | POA: Diagnosis not present

## 2023-08-26 DIAGNOSIS — N1831 Chronic kidney disease, stage 3a: Secondary | ICD-10-CM | POA: Diagnosis not present

## 2023-08-26 DIAGNOSIS — I10 Essential (primary) hypertension: Secondary | ICD-10-CM | POA: Diagnosis not present

## 2023-08-26 DIAGNOSIS — E785 Hyperlipidemia, unspecified: Secondary | ICD-10-CM | POA: Diagnosis not present

## 2023-08-26 DIAGNOSIS — E1165 Type 2 diabetes mellitus with hyperglycemia: Secondary | ICD-10-CM | POA: Diagnosis not present

## 2023-09-30 ENCOUNTER — Inpatient Hospital Stay: Payer: Medicare PPO | Attending: Internal Medicine

## 2023-09-30 ENCOUNTER — Inpatient Hospital Stay: Payer: Medicare PPO | Admitting: Internal Medicine

## 2023-09-30 VITALS — BP 132/65 | HR 71 | Temp 97.3°F | Resp 16 | Ht 60.0 in | Wt 162.8 lb

## 2023-09-30 DIAGNOSIS — D75839 Thrombocytosis, unspecified: Secondary | ICD-10-CM | POA: Insufficient documentation

## 2023-09-30 DIAGNOSIS — Z7982 Long term (current) use of aspirin: Secondary | ICD-10-CM | POA: Diagnosis not present

## 2023-09-30 DIAGNOSIS — D473 Essential (hemorrhagic) thrombocythemia: Secondary | ICD-10-CM | POA: Insufficient documentation

## 2023-09-30 LAB — CMP (CANCER CENTER ONLY)
ALT: 37 U/L (ref 0–44)
AST: 36 U/L (ref 15–41)
Albumin: 4.1 g/dL (ref 3.5–5.0)
Alkaline Phosphatase: 71 U/L (ref 38–126)
Anion gap: 7 (ref 5–15)
BUN: 17 mg/dL (ref 8–23)
CO2: 28 mmol/L (ref 22–32)
Calcium: 9 mg/dL (ref 8.9–10.3)
Chloride: 103 mmol/L (ref 98–111)
Creatinine: 1.09 mg/dL — ABNORMAL HIGH (ref 0.44–1.00)
GFR, Estimated: 52 mL/min — ABNORMAL LOW (ref >60)
Glucose, Bld: 182 mg/dL — ABNORMAL HIGH (ref 70–99)
Potassium: 3.2 mmol/L — ABNORMAL LOW (ref 3.5–5.1)
Sodium: 138 mmol/L (ref 135–145)
Total Bilirubin: 1 mg/dL (ref 0.0–1.2)
Total Protein: 8.3 g/dL — ABNORMAL HIGH (ref 6.5–8.1)

## 2023-09-30 LAB — CBC WITH DIFFERENTIAL (CANCER CENTER ONLY)
Abs Immature Granulocytes: 0.03 10*3/uL (ref 0.00–0.07)
Basophils Absolute: 0.2 10*3/uL — ABNORMAL HIGH (ref 0.0–0.1)
Basophils Relative: 2 %
Eosinophils Absolute: 0.2 10*3/uL (ref 0.0–0.5)
Eosinophils Relative: 2 %
HCT: 40.8 % (ref 36.0–46.0)
Hemoglobin: 13.2 g/dL (ref 12.0–15.0)
Immature Granulocytes: 0 %
Lymphocytes Relative: 22 %
Lymphs Abs: 2 10*3/uL (ref 0.7–4.0)
MCH: 28.6 pg (ref 26.0–34.0)
MCHC: 32.4 g/dL (ref 30.0–36.0)
MCV: 88.3 fL (ref 80.0–100.0)
Monocytes Absolute: 0.4 10*3/uL (ref 0.1–1.0)
Monocytes Relative: 5 %
Neutro Abs: 6.2 10*3/uL (ref 1.7–7.7)
Neutrophils Relative %: 69 %
Platelet Count: 471 10*3/uL — ABNORMAL HIGH (ref 150–400)
RBC: 4.62 MIL/uL (ref 3.87–5.11)
RDW: 14.1 % (ref 11.5–15.5)
WBC Count: 8.9 10*3/uL (ref 4.0–10.5)
nRBC: 0 % (ref 0.0–0.2)

## 2023-09-30 LAB — LACTATE DEHYDROGENASE: LDH: 166 U/L (ref 98–192)

## 2023-09-30 NOTE — Progress Notes (Signed)
 [facility] [phone]   [phone]  OFFICE PROGRESS NOTE  Ann Has, MD [address] [place] [address]  DIAGNOSIS: Essential thrombocythemia diagnosed in March 2022.  PRIOR THERAPY: None  CURRENT THERAPY: Hydroxyurea 500 mg p.o. daily.  Started October 23, 2020.  Her dose was changed to hydroxyurea 500 mg p.o. daily except Monday and Thursday she will take once 1000 mg.  INTERVAL HISTORY: SEE BEHARRY 78 y.o. female is to the clinic today for 24-month follow-up visit.  Discussed the use of AI scribe software for clinical note transcription with the patient, who gave verbal consent to proceed.  History of Present Illness   ANEYAH [name] is a 78 year old female with essential thrombocythemia who presents for follow-up.  She [name] essential thrombocythemia, diagnosed in March 2022, and is currently on hydroxyurea 500 mg once daily. No new complaints or symptoms related to her condition since her last visit four months ago. No nausea, vomiting, diarrhea, or headaches. Her platelet count is 471,000, which is lower than when she first started treatment. Initially, she was on a higher dose of hydroxyurea when her platelet count was higher.   She [name] noticed a small, mobile bump on her skin, approximately the size of a pea, which she plans to have evaluated by her dermatologist.  She feels fine overall and [name] no other complaints. She recently celebrated her 24th birthday with her family, which included a trip to [place].       MEDICAL HISTORY: Past Medical History:  Diagnosis Date   Diabetes mellitus without complication (HCC)    Hypertension     ALLERGIES:  is allergic to hydrocodone and sulfa antibiotics.  MEDICATIONS:  Current Outpatient Medications  Medication Sig Dispense Refill   allopurinol (ZYLOPRIM) 100 MG tablet Take 200 mg by mouth at bedtime.      amLODipine (NORVASC) 10 MG tablet Take 1  tablet (10 mg total) by mouth daily. 90 tablet 0   atenolol (TENORMIN) 50 MG tablet Take 50 mg by mouth daily. Unknown dose     Blood Glucose Monitoring Suppl (ACCU-CHEK GUIDE) w/Device KIT Use as instructed to check blood sugar 2X daily. 1 kit 0   clonazePAM (KLONOPIN) 0.5 MG tablet Take 0.5 mg by mouth daily as needed.     COLCRYS 0.6 MG tablet Take 0.6 mg by mouth daily as needed (for gout).     estradiol (VIVELLE-DOT) 0.075 MG/24HR Place 1 patch onto the skin 2 (two) times a week.     fluticasone (FLONASE) 50 MCG/ACT nasal spray      glimepiride (AMARYL) 4 MG tablet TAKE 0.5-1 TABLET BEFORE BREAKFAST AND 1 TABLET BEFORE DINNER 180 tablet 3   glucose blood (FREESTYLE TEST STRIPS) test strip Use 2x a day 100 each 11   hydroxyurea (HYDREA) 500 MG capsule TAKE 1 CAPSULE (500 MG TOTAL) BY MOUTH DAILY. MAY TAKE WITH FOOD TO MINIMIZE GI SIDE EFFECTS. 90 capsule 1   insulin glargine (LANTUS SOLOSTAR) 100 UNIT/ML Solostar Pen Inject 16-18 Units into the skin at bedtime.     Insulin Pen Needle 32G X 4 MM MISC Use 1x a day 100 each 3   montelukast (SINGULAIR) 10 MG tablet Take 10 mg by mouth at bedtime. Unknown dose     olmesartan-hydrochlorothiazide (BENICAR HCT) 40-12.5 MG tablet Take 1 tablet by mouth daily.     pantoprazole (PROTONIX) 20 MG tablet Take 20 mg by mouth daily.  pravastatin (PRAVACHOL) 10 MG tablet Take 1 tablet (10 mg total) by mouth daily. 90 tablet 3   Semaglutide, 1 MG/DOSE, (OZEMPIC, 1 MG/DOSE,) 4 MG/3ML SOPN Inject 1 mg into the skin once a week. 9 mL 3   No current facility-administered medications for this visit.    SURGICAL HISTORY:  Past Surgical History:  Procedure Laterality Date   ABDOMINAL HYSTERECTOMY     knee surgery  04/2013    REVIEW OF SYSTEMS:  A comprehensive review of systems was negative.   PHYSICAL EXAMINATION: General appearance: alert, cooperative, and no distress Head: Normocephalic, without obvious abnormality, atraumatic Neck: no adenopathy,  no JVD, supple, symmetrical, trachea midline, and thyroid not enlarged, symmetric, no tenderness/mass/nodules Lymph nodes: Cervical, supraclavicular, and axillary nodes normal. Resp: clear to auscultation bilaterally Back: symmetric, no curvature. ROM normal. No CVA tenderness. Cardio: regular rate and rhythm, S1, S2 normal, no murmur, click, rub or gallop GI: soft, non-tender; bowel sounds normal; no masses,  no organomegaly Extremities: extremities normal, atraumatic, no cyanosis or edema  ECOG PERFORMANCE STATUS: 0 - Asymptomatic  Blood pressure 132/65, pulse 71, temperature (!) 97.3 F (36.3 C), temperature source Temporal, resp. rate 16, height 5' (1.524 m), weight 162 lb 12.8 oz (73.8 kg), SpO2 99%.  LABORATORY DATA: Lab Results  Component Value Date   WBC 8.9 09/30/2023   HGB 13.2 09/30/2023   HCT 40.8 09/30/2023   MCV 88.3 09/30/2023   PLT 471 (H) 09/30/2023      Chemistry      Component Value Date/Time   NA 137 05/31/2023 0920   K 3.3 (L) 05/31/2023 0920   CL 100 05/31/2023 0920   CO2 28 05/31/2023 0920   BUN 20 05/31/2023 0920   CREATININE 1.29 (H) 05/31/2023 0920      Component Value Date/Time   CALCIUM 9.6 05/31/2023 0920   ALKPHOS 81 05/31/2023 0920   AST 37 05/31/2023 0920   ALT 45 (H) 05/31/2023 0920   BILITOT 1.1 05/31/2023 0920       RADIOGRAPHIC STUDIES: No results found.  ASSESSMENT AND PLAN: This is a very pleasant 78 years old African-American female diagnosed with essential thrombocythemia in March 2022 with positive JAK2 mutation. The patient is currently on hydroxyurea 500 mg p.o. daily and [name] been tolerating it fairly well.    Essential Thrombocythemia Diagnosed in March 2022, currently managed with hydroxyurea 500 mg once daily. Platelet count is 471,000, higher than previous levels but acceptable. Goal is to prevent further increase in platelet count. Discussed increasing hydroxyurea dose to 500 mg daily except on Monday and Thursday, when  the dose will be 1000 mg, to better control platelet levels. Addition of baby aspirin 81 mg daily for cardiovascular protection and platelet control, given her age of 78 years. Hydroxyurea is only available in oral form. - Increase hydroxyurea to 500 mg daily except on Monday and Thursday, when the dose will be 1000 mg. - Start baby aspirin 81 mg daily. - Follow up in two months to reassess platelet count and overall condition.  General Health Maintenance Discussed the importance of baby aspirin for cardiovascular health and platelet control given her age of 64 years. - Start baby aspirin 81 mg daily.  Follow-up - Schedule follow-up appointment in two months.   The patient was advised to call immediately if she [name] any concerning symptoms in the interval. The patient voices understanding of current disease status and treatment options and is in agreement with the current care plan.  All questions  were answered. The patient knows to call the clinic with any problems, questions or concerns. We can certainly see the patient much sooner if necessary.  Disclaimer: This note was dictated with voice recognition software. Similar sounding words can inadvertently be transcribed and may not be corrected upon review.

## 2023-10-07 ENCOUNTER — Other Ambulatory Visit: Payer: Self-pay | Admitting: Internal Medicine

## 2023-10-28 ENCOUNTER — Encounter: Payer: Self-pay | Admitting: Internal Medicine

## 2023-10-28 ENCOUNTER — Ambulatory Visit: Payer: Medicare PPO | Admitting: Internal Medicine

## 2023-10-28 VITALS — BP 122/60 | HR 66 | Ht 60.0 in | Wt 163.8 lb

## 2023-10-28 DIAGNOSIS — Z794 Long term (current) use of insulin: Secondary | ICD-10-CM | POA: Diagnosis not present

## 2023-10-28 DIAGNOSIS — E1129 Type 2 diabetes mellitus with other diabetic kidney complication: Secondary | ICD-10-CM | POA: Diagnosis not present

## 2023-10-28 DIAGNOSIS — Z7984 Long term (current) use of oral hypoglycemic drugs: Secondary | ICD-10-CM

## 2023-10-28 DIAGNOSIS — E785 Hyperlipidemia, unspecified: Secondary | ICD-10-CM

## 2023-10-28 DIAGNOSIS — R809 Proteinuria, unspecified: Secondary | ICD-10-CM

## 2023-10-28 DIAGNOSIS — Z7985 Long-term (current) use of injectable non-insulin antidiabetic drugs: Secondary | ICD-10-CM | POA: Diagnosis not present

## 2023-10-28 LAB — POCT GLYCOSYLATED HEMOGLOBIN (HGB A1C): Hemoglobin A1C: 8.1 % — AB (ref 4.0–5.6)

## 2023-10-28 MED ORDER — EMPAGLIFLOZIN 10 MG PO TABS: 10.0000 mg | ORAL_TABLET | Freq: Every day | ORAL | 3 refills | Status: AC

## 2023-10-28 NOTE — Patient Instructions (Addendum)
 Please continue: - Amaryl 2 mg before b'fast and 2 mg dinner - Ozempic 1 mg weekly  Please increase: - Lantus 16-18 units at bedtime   Try to start: - Jardiance 10 mg daily before b'fast If you are able to start this, may be able to stop Amaryl, depending on the blood sugars  Check sugars 1x a day, rotating check times.   Please return in 3 months with your sugar log.

## 2023-10-28 NOTE — Progress Notes (Signed)
 Patient ID: Ann [name], female   DOB: 05-22-1946, 78 y.o.   MRN: [id number]   HPI: Ann [name] is a 78 y.o.-year-old female, initially referred by Dr. Kateri Plummer, now returning for f/u for DM2, dx in 2006, non-insulin-dependent, uncontrolled, with complications (microalbuminuria). Last visit 3 months ago.  Interim history: No increased urination, blurry vision, nausea, chest pain.    Reviewed HbA1c levels: Lab Results  Component Value Date   HGBA1C 9.4 06/01/2023   HGBA1C 6.7 02/11/2023   HGBA1C 7.9 (A) 10/15/2022   HGBA1C 7.0 (A) 06/15/2022   HGBA1C 6.4 (A) 02/12/2022   HGBA1C 6.3 (A) 10/07/2021   HGBA1C 6.5 (A) 06/09/2021   HGBA1C 7.0 (A) 02/10/2021   HGBA1C 7.1 (A) 10/17/2020   HGBA1C 7.1 (A) 06/13/2020   HGBA1C 6.9 (A) 02/08/2020   HGBA1C 7.3 02/07/2020   HGBA1C 7.1 (A) 10/05/2019   HGBA1C 7.1 (A) 06/16/2019   HGBA1C 6.7 (A) 02/03/2019  02/25/2016: 11.2% Previously 9.7% Previously 8.9%  Pt is on a regimen of 5 - Amaryl >> 2 mg in am and 4 >> actually using 2 mg before D - Trulicity >> off >> Ozempic 0.5 >> 1 mg weekly - Lantus 10 >> 14 >> 16-18 >> still using 14 units daily  She was on Metformin 1000 mg 2x a day >> diarrhea >> 500 mg daily >> diarrhea >> stopped She refused insulin initially.  She is not checking sugars.  Previously: - am: 124-134 >> 122-150s >> 125-142 >> 134-163 - 2h after b'fast: n/c>> 206 - before lunch: n/c >> 136 (sick) >> n/c - 2h after lunch: n/c  - before dinner: 150-165 >> 150s >> 142-156 >> n/c >> 183 - 2h after dinner:  n/c >> 157-166, 209 >> 108, 198, 209 - bedtime: n/c >> 150s-160 >> 152-160s >> n/c  - nighttime: n/c >> 143-179 >> n/c Lowest sugar was  120 >> 122 >> 125 >> 108; she has hypoglycemia awareness in the 70s. Highest sugar was 201 >> .Marland Kitchen. 150 >> 209 >> 209 We tried a Dexcom CGM for her before but she is on hydroxyurea for essential thrombocytopenia and she had erratic values on it.  Glucometer: Freestyle  Lite  Pt's meals are: - Breakfast: cereal, boiled egg, juice - Lunch: sandwich, salad - Dinner: meat + salad, potato - Snacks: 3 She saw nutrition in the past.  No CKD, last BUN/creatinine:  Lab Results  Component Value Date   BUN 17 09/30/2023   Lab Results  Component Value Date   CREATININE 1.09 (H) 09/30/2023   He has a history of elevated ACR: 03/03/2023: ACR 44.2 02/16/2022: ACR 73.1 02/06/2021: ACR 19.8 02/07/2020: ACR 39.5 Lab Results  Component Value Date   MICRALBCREAT 1.7 09/30/2018  05/28/2015: Urine ACR 45.6 On olmesartan.  + HL;last set of lipids:  No results found for: "CHOL", "HDL", "LDLCALC", "LDLDIRECT", "TRIG", "CHOLHDL" She started the Pravastatin 10 mg In 01/2018.  - last eye exam was on 11/10/2022: No DR, + macular degeneration.  She had laser surgery OS.  Dr. Della Goo.  - no numbness and tingling in her feet.  Saw podiatry in 05/2021 for an melanonychia  -considered to be due to the hydroxyurea.  Last foot exam 06/01/2023.  She has essential thrombocytemia.  Latest TSH was normal: Component Value Reference Range Notes  [facility] Reviewed date:08/21/2022 05:23:07 PM Interpretation:2.26 Performing Lab: Notes/Report: Testing Performed at: [facility], [address], [address], [place], [place] [address]  TSH 2.26 0.34-4.50 UlU/mL  ROS: + see HPI  I reviewed pt's medications, allergies, PMH, social hx, family hx, and changes were documented in the history of present illness. Otherwise, unchanged from my initial visit note.  Past Medical History:  Diagnosis Date   Diabetes mellitus without complication (HCC)    Hypertension    Past Surgical History:  Procedure Laterality Date   ABDOMINAL HYSTERECTOMY     knee surgery  04/2013   Social History   Social History   Marital status: Married    Spouse name: N/A   Number of children: 3   Occupational History   Retired Programmer, systems   Social History Main Topics   Smoking  status: Never Smoker   Smokeless tobacco: No   Alcohol use No   Drug use: no   Current Outpatient Medications on File Prior to Visit  Medication Sig Dispense Refill   allopurinol (ZYLOPRIM) 100 MG tablet Take 200 mg by mouth at bedtime.      amLODipine (NORVASC) 10 MG tablet TAKE 1 TABLET BY MOUTH EVERY DAY 90 tablet 0   atenolol (TENORMIN) 50 MG tablet Take 50 mg by mouth daily. Unknown dose     Blood Glucose Monitoring Suppl (ACCU-CHEK GUIDE) w/Device KIT Use as instructed to check blood sugar 2X daily. 1 kit 0   clonazePAM (KLONOPIN) 0.5 MG tablet Take 0.5 mg by mouth daily as needed.     COLCRYS 0.6 MG tablet Take 0.6 mg by mouth daily as needed (for gout).     estradiol (VIVELLE-DOT) 0.075 MG/24HR Place 1 patch onto the skin 2 (two) times a week.     fluticasone (FLONASE) 50 MCG/ACT nasal spray      glimepiride (AMARYL) 4 MG tablet TAKE 0.5-1 TABLET BEFORE BREAKFAST AND 1 TABLET BEFORE DINNER 180 tablet 3   glucose blood (FREESTYLE TEST STRIPS) test strip Use 2x a day 100 each 11   hydroxyurea (HYDREA) 500 MG capsule TAKE 1 CAPSULE (500 MG TOTAL) BY MOUTH DAILY. MAY TAKE WITH FOOD TO MINIMIZE GI SIDE EFFECTS. 90 capsule 1   insulin glargine (LANTUS SOLOSTAR) 100 UNIT/ML Solostar Pen Inject 16-18 Units into the skin at bedtime.     Insulin Pen Needle 32G X 4 MM MISC Use 1x a day 100 each 3   montelukast (SINGULAIR) 10 MG tablet Take 10 mg by mouth at bedtime. Unknown dose     olmesartan-hydrochlorothiazide (BENICAR HCT) 40-12.5 MG tablet Take 1 tablet by mouth daily.     pantoprazole (PROTONIX) 20 MG tablet Take 20 mg by mouth daily.     pravastatin (PRAVACHOL) 10 MG tablet Take 1 tablet (10 mg total) by mouth daily. 90 tablet 3   Semaglutide, 1 MG/DOSE, (OZEMPIC, 1 MG/DOSE,) 4 MG/3ML SOPN Inject 1 mg into the skin once a week. 9 mL 3   No current facility-administered medications on file prior to visit.   Allergies  Allergen Reactions   Hydrocodone Itching   Sulfa Antibiotics      FH: - Diabetes in mother, sister, brother - Hypertension in mother and sister - Hyperlipidemia in brother  PE: BP 122/60   Pulse 66   Ht 5' (1.524 m)   Wt 163 lb 12.8 oz (74.3 kg)   SpO2 99%   BMI 31.99 kg/m  Wt Readings from Last 3 Encounters:  10/28/23 163 lb 12.8 oz (74.3 kg)  09/30/23 162 lb 12.8 oz (73.8 kg)  07/19/23 161 lb 9.6 oz (73.3 kg)   Constitutional: normal weight, in NAD Eyes: no exophthalmos ENT: no  thyromegaly, no cervical lymphadenopathy Cardiovascular: RRR, No MRG Respiratory: CTA B Musculoskeletal: no deformities Skin: no rashes Neurological: no tremor with outstretched hands  ASSESSMENT: 1. DM2, now insulin-dependent, uncontrolled, with complications - microalbuminuria  2. HL  3. MAU  PLAN:  1. Patient with longstanding, uncontrolled, type 2 diabetes, with improvement in control after adding a GLP-1 receptor agonist but with deteriorating control after she relaxed her diet and stopped checking blood sugars.  HbA1c increased to 9.4%.  We increased her Ozempic dose at that time and recommended long-acting insulin.  At last visit, sugars were improved but they were still above target in the morning and I advised her to increase the dose of Lantus.  Otherwise I did not suggest a change in regimen but again encouraged her to try to check blood sugars daily, rotating check times that she was checking only sporadically. -At today's visit, she is still not checking blood sugars.  We discussed that it is impossible to adjust her regimen without having information about her blood sugars.  Moreover, she is using different doses of Lantus and higher than recommended at last visit (lower).  At today's visit, I recommended to increase the dose of Lantus slightly, as discussed at last visit, but I did not suggest an increase in Amaryl dose for now.  Moreover, due to her microalbuminuria, I suggested to change from Amaryl to [place].  I explained the benefits and  possible side effects.  I advised her to stay very well-hydrated.  Will check a urine protein level at today's visit, before starting Jardiance.  I advised her to stop Amaryl after starting Jardiance, if the sugars improve. - I suggested to:  Patient Instructions  Please continue: - Amaryl 2 mg before b'fast and 2 mg dinner - Ozempic 1 mg weekly  Please increase: - Lantus 16-18 units at bedtime   Try to start: - Jardiance 10 mg daily before b'fast If you are able to start this, may be able to stop Amaryl, depending on the blood sugars  Check sugars 1x a day, rotating check times.   Please return in 3 months with your sugar log.   - we checked her HbA1c: 8.1% (lower) - advised to check sugars at different times of the day - 1x a day, rotating check times - advised for yearly eye exams >> she is UTD - return to clinic in 3 months  2. HL -Latest blood panel was reviewed from 02/2023: All fractions at goal-see HPI No results found for: "CHOL", "HDL", "LDLCALC", "LDLDIRECT", "TRIG", "CHOLHDL" -She continues Pravastatin 10 mg daily without side effects  3. MAU -Patient with a history of microalbuminuria, with previously fluctuating levels, 73.1 in 01/2022, but lower, at 44.2 on 03/03/2023 -She is on losartan 100 mg daily -At today's visit I recommended an SGLT2 inhibitor.  I am hoping that this is covered for her. -Will recheck an ACR before starting this  Orders Placed This Encounter  Procedures   Microalbumin / creatinine urine ratio   Carlus Pavlov, MD PhD [facility]

## 2023-10-28 NOTE — Addendum Note (Signed)
 Addended by: Pollie Meyer on: 10/28/2023 02:03 PM   Modules accepted: Orders

## 2023-10-29 ENCOUNTER — Encounter: Payer: Self-pay | Admitting: Internal Medicine

## 2023-10-29 LAB — MICROALBUMIN / CREATININE URINE RATIO
Creatinine, Urine: 158 mg/dL (ref 20–275)
Microalb Creat Ratio: 95 mg/g{creat} — ABNORMAL HIGH (ref <30)
Microalb, Ur: 15 mg/dL

## 2023-11-11 DIAGNOSIS — H25013 Cortical age-related cataract, bilateral: Secondary | ICD-10-CM | POA: Diagnosis not present

## 2023-11-11 LAB — HM DIABETES EYE EXAM

## 2023-11-22 ENCOUNTER — Ambulatory Visit: Payer: Self-pay

## 2023-11-22 DIAGNOSIS — M25561 Pain in right knee: Secondary | ICD-10-CM | POA: Diagnosis not present

## 2023-11-22 DIAGNOSIS — Z6829 Body mass index (BMI) 29.0-29.9, adult: Secondary | ICD-10-CM | POA: Diagnosis not present

## 2023-11-22 DIAGNOSIS — E663 Overweight: Secondary | ICD-10-CM | POA: Diagnosis not present

## 2023-11-22 NOTE — Telephone Encounter (Signed)
  Chief Complaint: leg injury Symptoms: pain Frequency: constant Pertinent Negatives: Patient denies other symptoms Disposition: [] ED /[] Urgent Care (no appt availability in office) / [] Appointment(In office/virtual)/ []  [facility]/ [] Home Care/ [] Refused Recommended Disposition /[] [facility]/ [x]  Follow-up with PCP Additional Notes:  Walking last night when she felt a pop in her right leg back of knee. She had immediate pain and difficulty ambulating. Mild swelling. Used aleve without effect. She is needing to use a cane to ambulate. She just left [facility] urgent care, they had 1.5 hour wait, she is calling [facility] to see if triage can get her in sooner. She is followed by Dr. Gret[facility], she has not called PCP at this time as she anticipates no appointments. Plan to call PCP and follow their recommendation.   Copied from CRM [id number]. Topic: Clinical - Red Word Triage >> Nov 22, 2023 11:15 AM Ann [name] wrote: Red Word that prompted transfer to Nurse Triage: In lots of pain and can barely walk. Thinks she pulled a hamstring in right leg. Pt is not established and wants to be seen today. Does not want to go to ER. Reason for Disposition  [1] SEVERE pain (e.g., excruciating pain, unable to do any normal activities)  AND [2] not improved 2 hours after pain medicine/ice packs  Protocols used: Leg Injury-A-AH

## 2023-11-23 DIAGNOSIS — M1711 Unilateral primary osteoarthritis, right knee: Secondary | ICD-10-CM | POA: Diagnosis not present

## 2023-11-30 ENCOUNTER — Other Ambulatory Visit: Payer: Self-pay | Admitting: Internal Medicine

## 2023-11-30 DIAGNOSIS — D473 Essential (hemorrhagic) thrombocythemia: Secondary | ICD-10-CM

## 2023-12-02 ENCOUNTER — Inpatient Hospital Stay: Admitting: Internal Medicine

## 2023-12-02 ENCOUNTER — Inpatient Hospital Stay: Attending: Internal Medicine

## 2023-12-02 VITALS — BP 143/65 | HR 70 | Temp 97.1°F | Resp 16 | Ht 60.0 in | Wt 160.3 lb

## 2023-12-02 DIAGNOSIS — Z7964 Long term (current) use of myelosuppressive agent: Secondary | ICD-10-CM | POA: Insufficient documentation

## 2023-12-02 DIAGNOSIS — D75839 Thrombocytosis, unspecified: Secondary | ICD-10-CM | POA: Diagnosis not present

## 2023-12-02 DIAGNOSIS — R7989 Other specified abnormal findings of blood chemistry: Secondary | ICD-10-CM | POA: Diagnosis not present

## 2023-12-02 DIAGNOSIS — D473 Essential (hemorrhagic) thrombocythemia: Secondary | ICD-10-CM

## 2023-12-02 DIAGNOSIS — E119 Type 2 diabetes mellitus without complications: Secondary | ICD-10-CM | POA: Insufficient documentation

## 2023-12-02 LAB — CBC WITH DIFFERENTIAL (CANCER CENTER ONLY)
Abs Immature Granulocytes: 0.02 10*3/uL (ref 0.00–0.07)
Basophils Absolute: 0.1 10*3/uL (ref 0.0–0.1)
Basophils Relative: 2 %
Eosinophils Absolute: 0.1 10*3/uL (ref 0.0–0.5)
Eosinophils Relative: 2 %
HCT: 38.3 % (ref 36.0–46.0)
Hemoglobin: 12.7 g/dL (ref 12.0–15.0)
Immature Granulocytes: 0 %
Lymphocytes Relative: 24 %
Lymphs Abs: 1.7 10*3/uL (ref 0.7–4.0)
MCH: 28.9 pg (ref 26.0–34.0)
MCHC: 33.2 g/dL (ref 30.0–36.0)
MCV: 87.2 fL (ref 80.0–100.0)
Monocytes Absolute: 0.3 10*3/uL (ref 0.1–1.0)
Monocytes Relative: 4 %
Neutro Abs: 5 10*3/uL (ref 1.7–7.7)
Neutrophils Relative %: 68 %
Platelet Count: 367 10*3/uL (ref 150–400)
RBC: 4.39 MIL/uL (ref 3.87–5.11)
RDW: 14.9 % (ref 11.5–15.5)
WBC Count: 7.3 10*3/uL (ref 4.0–10.5)
nRBC: 0 % (ref 0.0–0.2)

## 2023-12-02 LAB — CMP (CANCER CENTER ONLY)
ALT: 36 U/L (ref 0–44)
AST: 33 U/L (ref 15–41)
Albumin: 4.3 g/dL (ref 3.5–5.0)
Alkaline Phosphatase: 64 U/L (ref 38–126)
Anion gap: 9 (ref 5–15)
BUN: 25 mg/dL — ABNORMAL HIGH (ref 8–23)
CO2: 29 mmol/L (ref 22–32)
Calcium: 9 mg/dL (ref 8.9–10.3)
Chloride: 100 mmol/L (ref 98–111)
Creatinine: 1.43 mg/dL — ABNORMAL HIGH (ref 0.44–1.00)
GFR, Estimated: 38 mL/min — ABNORMAL LOW (ref >60)
Glucose, Bld: 113 mg/dL — ABNORMAL HIGH (ref 70–99)
Potassium: 3.3 mmol/L — ABNORMAL LOW (ref 3.5–5.1)
Sodium: 138 mmol/L (ref 135–145)
Total Bilirubin: 0.9 mg/dL (ref 0.0–1.2)
Total Protein: 8.5 g/dL — ABNORMAL HIGH (ref 6.5–8.1)

## 2023-12-02 LAB — LACTATE DEHYDROGENASE: LDH: 155 U/L (ref 98–192)

## 2023-12-02 NOTE — Progress Notes (Signed)
 [facility] Telephone:(336) [phone]   Fax:(336) [phone]  OFFICE PROGRESS NOTE  Ronna Coho, MD [address] [place] [address]  DIAGNOSIS: Essential thrombocythemia diagnosed in March 2022.  PRIOR THERAPY: None  CURRENT THERAPY: Hydroxyurea  500 mg p.o. daily.  Started October 23, 2020.  Her dose was changed to hydroxyurea  500 mg p.o. daily except Monday and Thursday she will take once 1000 mg.  INTERVAL HISTORY: Ann [name] 78 y.o. female is to the clinic today for 37-month follow-up visit.  Discussed the use of AI scribe software for clinical note transcription with the patient, who gave verbal consent to proceed.  History of Present Illness   GUELDA [name] is a 77 year old female with essential thrombocythemia who presents for evaluation and repeat blood work.  She has a history of essential thrombocythemia diagnosed in March 2022 and is currently managed with hydroxyurea . She takes 500 mg daily, except on Mondays and Thursdays when she takes 1000 mg. No side effects from the medication and she adheres to the dosing schedule, although she occasionally forgets a dose.  Approximately two weeks ago, she experienced a 'pop' in the back of her right knee while walking, leading to an initial inability to bear weight. She notes significant improvement since then and continues to engage in regular physical activity, including using a stationary bike and performing chair exercises.  She has been taking Aleve for neck pain before being prescribed Mylicon. She is also on olmesartan  for blood pressure and colchicine, which she takes regularly despite not having current gout symptoms. She is concerned about her kidney function, as her serum creatinine level is elevated at 1.43 mg/dL.       MEDICAL HISTORY: Past Medical History:  Diagnosis Date   Diabetes mellitus without complication (HCC)    Hypertension     ALLERGIES:  is allergic to  hydrocodone and sulfa antibiotics.  MEDICATIONS:  Current Outpatient Medications  Medication Sig Dispense Refill   allopurinol  (ZYLOPRIM ) 100 MG tablet Take 200 mg by mouth at bedtime.      amLODipine  (NORVASC ) 10 MG tablet TAKE 1 TABLET BY MOUTH EVERY DAY 90 tablet 0   atenolol  (TENORMIN ) 50 MG tablet Take 50 mg by mouth daily. Unknown dose     Blood Glucose Monitoring Suppl (ACCU-CHEK GUIDE) w/Device KIT Use as instructed to check blood sugar 2X daily. 1 kit 0   clonazePAM (KLONOPIN) 0.5 MG tablet Take 0.5 mg by mouth daily as needed.     COLCRYS 0.6 MG tablet Take 0.6 mg by mouth daily as needed (for gout).     empagliflozin  (JARDIANCE ) 10 MG TABS tablet Take 1 tablet (10 mg total) by mouth daily before breakfast. 90 tablet 3   estradiol  (VIVELLE -DOT) 0.075 MG/24HR Place 1 patch onto the skin 2 (two) times a week.     fluticasone (FLONASE) 50 MCG/ACT nasal spray      glimepiride  (AMARYL ) 4 MG tablet TAKE 0.5-1 TABLET BEFORE BREAKFAST AND 1 TABLET BEFORE DINNER 180 tablet 3   glucose blood (FREESTYLE TEST STRIPS) test strip Use 2x a day 100 each 11   hydroxyurea  (HYDREA ) 500 MG capsule TAKE 1 CAPSULE (500 MG TOTAL) BY MOUTH DAILY. MAY TAKE WITH FOOD TO MINIMIZE GI SIDE EFFECTS. 90 capsule 1   insulin  glargine (LANTUS  SOLOSTAR) 100 UNIT/ML Solostar Pen Inject 16-18 Units into the skin at bedtime.     Insulin  Pen Needle 32G X 4 MM MISC Use 1x  a day 100 each 3   montelukast  (SINGULAIR ) 10 MG tablet Take 10 mg by mouth at bedtime. Unknown dose     olmesartan -hydrochlorothiazide (BENICAR  HCT) 40-12.5 MG tablet Take 1 tablet by mouth daily.     pantoprazole  (PROTONIX ) 20 MG tablet Take 20 mg by mouth daily.     pravastatin  (PRAVACHOL ) 10 MG tablet Take 1 tablet (10 mg total) by mouth daily. 90 tablet 3   Semaglutide , 1 MG/DOSE, (OZEMPIC , 1 MG/DOSE,) 4 MG/3ML SOPN Inject 1 mg into the skin once a week. 9 mL 3   No current facility-administered medications for this visit.    SURGICAL HISTORY:   Past Surgical History:  Procedure Laterality Date   ABDOMINAL HYSTERECTOMY     knee surgery  04/2013    REVIEW OF SYSTEMS:  A comprehensive review of systems was negative except for: Musculoskeletal: positive for arthralgias   PHYSICAL EXAMINATION: General appearance: alert, cooperative, and no distress Head: Normocephalic, without obvious abnormality, atraumatic Neck: no adenopathy, no JVD, supple, symmetrical, trachea midline, and thyroid  not enlarged, symmetric, no tenderness/mass/nodules Lymph nodes: Cervical, supraclavicular, and axillary nodes normal. Resp: clear to auscultation bilaterally Back: symmetric, no curvature. ROM normal. No CVA tenderness. Cardio: regular rate and rhythm, S1, S2 normal, no murmur, click, rub or gallop GI: soft, non-tender; bowel sounds normal; no masses,  no organomegaly Extremities: extremities normal, atraumatic, no cyanosis or edema  ECOG PERFORMANCE STATUS: 0 - Asymptomatic  Blood pressure (!) 143/65, pulse 70, temperature (!) 97.1 F (36.2 C), temperature source Temporal, resp. rate 16, height 5' (1.524 m), weight 160 lb 4.8 oz (72.7 kg), SpO2 100%.  LABORATORY DATA: Lab Results  Component Value Date   WBC 7.3 12/02/2023   HGB 12.7 12/02/2023   HCT 38.3 12/02/2023   MCV 87.2 12/02/2023   PLT 367 12/02/2023      Chemistry      Component Value Date/Time   NA 138 09/30/2023 0844   K 3.2 (L) 09/30/2023 0844   CL 103 09/30/2023 0844   CO2 28 09/30/2023 0844   BUN 17 09/30/2023 0844   CREATININE 1.09 (H) 09/30/2023 0844      Component Value Date/Time   CALCIUM 9.0 09/30/2023 0844   ALKPHOS 71 09/30/2023 0844   AST 36 09/30/2023 0844   ALT 37 09/30/2023 0844   BILITOT 1.0 09/30/2023 0844       RADIOGRAPHIC STUDIES: No results found.  ASSESSMENT AND PLAN: This is a very pleasant 78 years old African-American female diagnosed with essential thrombocythemia in March 2022 with positive JAK2 mutation. The patient is currently on  hydroxyurea  500 mg p.o. daily except Monday and Thursday she is on 1000 mg and has been tolerating it fairly well.    Essential thrombocythemia Diagnosed in March 2022, currently managed with hydroxyurea . Platelet count is within normal range at 367, indicating effective management with current dosing regimen. No reported side effects from hydroxyurea . - Continue hydroxyurea  1000 mg on Mondays and Thursdays, 500 mg on other days.  Elevated creatinine Serum creatinine is elevated at 1.43, concerning for potential kidney impairment. Possible contributing factors include use of NSAIDs (ibuprofen, Aleve) and medications like colchicine and olmesartan . - Advise increased hydration. - Discontinue colchicine. - Advise reduction of NSAID use. - Monitor kidney function with repeat blood work in three months.  Diabetes Diabetes is a known risk factor for kidney impairment, potentially contributing to elevated creatinine levels.   The patient was advised to call immediately if she has any concerning symptoms in the  interval. The patient voices understanding of current disease status and treatment options and is in agreement with the current care plan.  All questions were answered. The patient knows to call the clinic with any problems, questions or concerns. We can certainly see the patient much sooner if necessary.  Disclaimer: This note was dictated with voice recognition software. Similar sounding words can inadvertently be transcribed and may not be corrected upon review.

## 2023-12-08 DIAGNOSIS — M1711 Unilateral primary osteoarthritis, right knee: Secondary | ICD-10-CM | POA: Diagnosis not present

## 2024-01-21 DIAGNOSIS — N1831 Chronic kidney disease, stage 3a: Secondary | ICD-10-CM | POA: Diagnosis not present

## 2024-01-21 DIAGNOSIS — E1165 Type 2 diabetes mellitus with hyperglycemia: Secondary | ICD-10-CM | POA: Diagnosis not present

## 2024-01-21 DIAGNOSIS — I10 Essential (primary) hypertension: Secondary | ICD-10-CM | POA: Diagnosis not present

## 2024-01-31 DIAGNOSIS — E1165 Type 2 diabetes mellitus with hyperglycemia: Secondary | ICD-10-CM | POA: Diagnosis not present

## 2024-01-31 DIAGNOSIS — I10 Essential (primary) hypertension: Secondary | ICD-10-CM | POA: Diagnosis not present

## 2024-01-31 DIAGNOSIS — N1831 Chronic kidney disease, stage 3a: Secondary | ICD-10-CM | POA: Diagnosis not present

## 2024-02-03 ENCOUNTER — Ambulatory Visit: Admitting: Internal Medicine

## 2024-02-03 ENCOUNTER — Encounter: Payer: Self-pay | Admitting: Internal Medicine

## 2024-02-03 VITALS — BP 122/70 | HR 73 | Ht 60.0 in | Wt 158.8 lb

## 2024-02-03 DIAGNOSIS — Z7984 Long term (current) use of oral hypoglycemic drugs: Secondary | ICD-10-CM | POA: Diagnosis not present

## 2024-02-03 DIAGNOSIS — R809 Proteinuria, unspecified: Secondary | ICD-10-CM

## 2024-02-03 DIAGNOSIS — E1129 Type 2 diabetes mellitus with other diabetic kidney complication: Secondary | ICD-10-CM | POA: Diagnosis not present

## 2024-02-03 DIAGNOSIS — E785 Hyperlipidemia, unspecified: Secondary | ICD-10-CM

## 2024-02-03 DIAGNOSIS — Z7985 Long-term (current) use of injectable non-insulin antidiabetic drugs: Secondary | ICD-10-CM

## 2024-02-03 DIAGNOSIS — Z794 Long term (current) use of insulin: Secondary | ICD-10-CM

## 2024-02-03 LAB — POCT GLYCOSYLATED HEMOGLOBIN (HGB A1C): Hemoglobin A1C: 6.8 % — AB (ref 4.0–5.6)

## 2024-02-03 NOTE — Patient Instructions (Addendum)
 Please try to move: - Amaryl  2 mg before dinner  Continue: - Ozempic  1 mg weekly - Lantus  18 units at bedtime  - Jardiance  10 mg daily before b'fast   Check sugars 1x a day, rotating check times.               Please return in 3 months with your sugar log.

## 2024-02-03 NOTE — Progress Notes (Signed)
 Patient ID: Ann [name], female   DOB: 07/26/1946, 78 y.o.   MRN: [id number]   HPI: Ann [name] is a 78 y.o.-year-old female, initially referred by Dr. Kip, now returning for f/u for DM2, dx in 2006, insulin -dependent, uncontrolled, with complications (microalbuminuria). Last visit 3 months ago.  Interim history: No increased urination, blurry vision, nausea, chest pain.    Reviewed HbA1c levels: Lab Results  Component Value Date   HGBA1C 8.1 (A) 10/28/2023   HGBA1C 9.4 06/01/2023   HGBA1C 6.7 02/11/2023   HGBA1C 7.9 (A) 10/15/2022   HGBA1C 7.0 (A) 06/15/2022   HGBA1C 6.4 (A) 02/12/2022   HGBA1C 6.3 (A) 10/07/2021   HGBA1C 6.5 (A) 06/09/2021   HGBA1C 7.0 (A) 02/10/2021   HGBA1C 7.1 (A) 10/17/2020   HGBA1C 7.1 (A) 06/13/2020   HGBA1C 6.9 (A) 02/08/2020   HGBA1C 7.3 02/07/2020   HGBA1C 7.1 (A) 10/05/2019   HGBA1C 7.1 (A) 06/16/2019  02/25/2016: 11.2% Previously 9.7% Previously 8.9%  At last visit she was on: - Amaryl  >> 2 mg in am and 4 >> actually using 2 mg before D - Trulicity  >> off >> Ozempic  0.5 >> 1 mg weekly - Lantus  10 >> 14 >> 16-18 >> still using 14 units daily  She was on Metformin  1000 mg 2x a day >> diarrhea >> 500 mg daily >> diarrhea >> stopped She refused insulin  initially.  I recommended the following regimen: - Amaryl  2 mg before b'fast and 2 mg dinner >> now 2 mg AFTER dinner - Ozempic  1 mg weekly - Lantus  18 units at bedtime  - Jardiance  10 mg daily before b'fast  She now checks CBG 1x a day: - am: 122-150s >> 125-142 >> 134-163 >> 109-141 - 2h after b'fast: n/c>> 206 >>  - before lunch: n/c >> 136 (sick) >> n/c - 2h after lunch: n/c  - before dinner: 150s >> 142-156 >> n/c >> 183 >> n/c - 2h after dinner: 157-166, 209 >> 108, 198, 209 >> 152, 158 - bedtime: n/c >> 150s-160 >> 152-160s >> n/c  - nighttime: n/c >> 143-179 >> n/c Lowest sugar was  120 >> 122 >> 125 >> 109; she has hypoglycemia awareness in the 70s. Highest sugar was  201 >> .Ann [name]. 150 >> 209 >> 209 >> 158 We tried a Dexcom CGM for her before but she is on hydroxyurea  for essential thrombocytopenia and she had erratic values on it.  Glucometer: Freestyle Lite  Pt's meals are: - Breakfast: cereal, boiled egg, juice - Lunch: sandwich, salad - Dinner: meat + salad, potato - Snacks: 3 She saw nutrition in the past.  No CKD, last BUN/creatinine:  Lab Results  Component Value Date   BUN 25 (H) 12/02/2023   Lab Results  Component Value Date   CREATININE 1.43 (H) 12/02/2023   He has an elevated ACR: Lab Results  Component Value Date   MICRALBCREAT 95 (H) 10/28/2023  03/03/2023: ACR 44.2 02/16/2022: ACR 73.1 02/06/2021: ACR 19.8 02/07/2020: ACR 39.5 05/28/2015: Urine ACR 45.6 On olmesartan .  + HL;last set of lipids:  No results found for: CHOL, HDL, LDLCALC, LDLDIRECT, TRIG, CHOLHDL She started the Pravastatin  10 mg In 01/2018.  - last eye exam was on 11/11/2023: No DR, + macular degeneration.  She had laser surgery OS.  Dr. Trude Blanch.  - no numbness and tingling in her feet.  Saw podiatry in 05/2021 for melanonychia - considered to be due to the hydroxyurea .  Last foot exam 06/01/2023.  She has  essential thrombocytemia.  Latest TSH was normal: Component Value Reference Range Notes  Thyroid  [facility] Reviewed date:08/21/2022 05:23:07 PM Interpretation:2.26 Performing Lab: Notes/Report: Testing Performed at: [facility], [address], [address], [place], [place] [address]  TSH 2.26 0.34-4.50 UlU/mL     ROS: + see HPI  I reviewed pt's medications, allergies, PMH, social hx, family hx, and changes were documented in the history of present illness. Otherwise, unchanged from my initial visit note.  Past Medical History:  Diagnosis Date   Diabetes mellitus without complication (HCC)    Hypertension    Past Surgical History:  Procedure Laterality Date   ABDOMINAL HYSTERECTOMY     knee surgery  04/2013   Social  History   Social History   Marital status: Married    Spouse name: N/A   Number of children: 3   Occupational History   Retired Programmer, systems   Social History Main Topics   Smoking status: Never Smoker   Smokeless tobacco: No   Alcohol use No   Drug use: no   Current Outpatient Medications on File Prior to Visit  Medication Sig Dispense Refill   allopurinol  (ZYLOPRIM ) 100 MG tablet Take 200 mg by mouth at bedtime.      amLODipine  (NORVASC ) 10 MG tablet TAKE 1 TABLET BY MOUTH EVERY DAY 90 tablet 0   atenolol  (TENORMIN ) 50 MG tablet Take 50 mg by mouth daily. Unknown dose     Blood Glucose Monitoring Suppl (ACCU-CHEK GUIDE) w/Device KIT Use as instructed to check blood sugar 2X daily. 1 kit 0   clonazePAM (KLONOPIN) 0.5 MG tablet Take 0.5 mg by mouth daily as needed.     COLCRYS 0.6 MG tablet Take 0.6 mg by mouth daily as needed (for gout).     empagliflozin  (JARDIANCE ) 10 MG TABS tablet Take 1 tablet (10 mg total) by mouth daily before breakfast. 90 tablet 3   estradiol  (VIVELLE -DOT) 0.075 MG/24HR Place 1 patch onto the skin 2 (two) times a week.     fluticasone (FLONASE) 50 MCG/ACT nasal spray      glimepiride  (AMARYL ) 4 MG tablet TAKE 0.5-1 TABLET BEFORE BREAKFAST AND 1 TABLET BEFORE DINNER 180 tablet 3   glucose blood (FREESTYLE TEST STRIPS) test strip Use 2x a day 100 each 11   hydroxyurea  (HYDREA ) 500 MG capsule TAKE 1 CAPSULE (500 MG TOTAL) BY MOUTH DAILY. MAY TAKE WITH FOOD TO MINIMIZE GI SIDE EFFECTS. 90 capsule 1   insulin  glargine (LANTUS  SOLOSTAR) 100 UNIT/ML Solostar Pen Inject 16-18 Units into the skin at bedtime.     Insulin  Pen Needle 32G X 4 MM MISC Use 1x a day 100 each 3   montelukast  (SINGULAIR ) 10 MG tablet Take 10 mg by mouth at bedtime. Unknown dose     olmesartan -hydrochlorothiazide (BENICAR  HCT) 40-12.5 MG tablet Take 1 tablet by mouth daily.     pantoprazole  (PROTONIX ) 20 MG tablet Take 20 mg by mouth daily.     pravastatin  (PRAVACHOL ) 10 MG tablet Take 1 tablet  (10 mg total) by mouth daily. 90 tablet 3   Semaglutide , 1 MG/DOSE, (OZEMPIC , 1 MG/DOSE,) 4 MG/3ML SOPN Inject 1 mg into the skin once a week. 9 mL 3   No current facility-administered medications on file prior to visit.   Allergies  Allergen Reactions   Hydrocodone Itching   Sulfa Antibiotics     FH: - Diabetes in mother, sister, brother - Hypertension in mother and sister - Hyperlipidemia in brother  PE: BP 122/70  Pulse 73   Ht 5' (1.524 m)   Wt 158 lb 12.8 oz (72 kg)   SpO2 96%   BMI 31.01 kg/m  Wt Readings from Last 3 Encounters:  02/03/24 158 lb 12.8 oz (72 kg)  12/02/23 160 lb 4.8 oz (72.7 kg)  10/28/23 163 lb 12.8 oz (74.3 kg)   Constitutional: normal weight, in NAD Eyes: no exophthalmos ENT: no thyromegaly, no cervical lymphadenopathy Cardiovascular: RRR, No MRG Respiratory: CTA B Musculoskeletal: no deformities Skin: no rashes Neurological: no tremor with outstretched hands Diabetic Foot Exam - Simple   Simple Foot Form Diabetic Foot exam was performed with the following findings: Yes 02/03/2024  1:46 PM  Visual Inspection No deformities, no ulcerations, no other skin breakdown bilaterally: Yes Sensation Testing Intact to touch and monofilament testing bilaterally: Yes Pulse Check Posterior Tibialis and Dorsalis pulse intact bilaterally: Yes Comments    ASSESSMENT: 1. DM2, insulin -dependent, uncontrolled, with complications - microalbuminuria  2. HL  3. MAU  PLAN:  1. Patient with longstanding, uncontrolled, type 2 diabetes, with improvement in control after adding a GLP-1 receptor agonist, but with deteriorating control after she relaxed her diet and start checking blood sugars.  HbA1c increased to 9.4%.  We increased the Ozempic  dose and recommended long-acting insulin .  At last visit, she was still not checking blood sugars.  We discussed that it was impossible to adjust her regimen and overall gain control over her diabetes without having  information about her blood sugars.  I advised her to increase the Lantus  dose and add an SGLT2 inhibitor especially as she also has microalbuminuria.  I did advise her to stop Amaryl  after starting Jardiance , if she is able to start this.  I also advised her to stay well-hydrated while on the SGLT2 inhibitor. -At today's visit, she is not checking sugars consistently but she did start to check and her sugars appear to be mostly at goal with few exceptions.  She is taking Amaryl  only once a day after dinner and I advised her that she can continue to take it once a day but mostly before dinner.  Will continue the rest of the regimen for now. - I suggested to:  Patient Instructions  Please try to move: - Amaryl  2 mg before dinner  Continue: - Ozempic  1 mg weekly - Lantus  18 units at bedtime  - Jardiance  10 mg daily before b'fast   Check sugars 1x a day, rotating check times.               Please return in 3 months with your sugar log.    - we checked her HbA1c: 6.8% (much better) - advised to check sugars at different times of the day - 1-2x a day, rotating check times - advised for yearly eye exams >> she is UTD - return to clinic in 3 months  2. HL - Latest lipid panel was reviewed from 02/2023: All fractions at [facility] H[facility]. No results found for: CHOL, HDL, LDLCALC, LDLDIRECT, TRIG, CHOLHDL - She continues pravastatin  10 mg daily without side effects - she has another appointment coming up with PCP next month and will have a lipid panel checked at that time  3. MAU - Patient with a history of microalbuminuria, with previously fluctuating levels, 73.1 in 01/2022, but lower, at 44.2 on 03/03/2023.  At last visit, however, the level was elevated, at 95. - She continues on losartan 100 mg daily and at last visit I recommended to add Jardiance , also.  She was able to start.  - We will recheck her ACR today  Lela Fendt, MD PhD [facility]

## 2024-02-04 ENCOUNTER — Ambulatory Visit: Payer: Self-pay | Admitting: Internal Medicine

## 2024-02-04 LAB — MICROALBUMIN / CREATININE URINE RATIO
Creatinine, Urine: 209 mg/dL (ref 20–275)
Microalb Creat Ratio: 24 mg/g{creat} (ref <30)
Microalb, Ur: 5 mg/dL

## 2024-02-17 DIAGNOSIS — B3731 Acute candidiasis of vulva and vagina: Secondary | ICD-10-CM | POA: Diagnosis not present

## 2024-02-17 DIAGNOSIS — N898 Other specified noninflammatory disorders of vagina: Secondary | ICD-10-CM | POA: Diagnosis not present

## 2024-02-17 DIAGNOSIS — N9489 Other specified conditions associated with female genital organs and menstrual cycle: Secondary | ICD-10-CM | POA: Diagnosis not present

## 2024-02-21 DIAGNOSIS — N898 Other specified noninflammatory disorders of vagina: Secondary | ICD-10-CM | POA: Diagnosis not present

## 2024-02-24 DIAGNOSIS — E1165 Type 2 diabetes mellitus with hyperglycemia: Secondary | ICD-10-CM | POA: Diagnosis not present

## 2024-02-24 DIAGNOSIS — F411 Generalized anxiety disorder: Secondary | ICD-10-CM | POA: Diagnosis not present

## 2024-02-24 DIAGNOSIS — I10 Essential (primary) hypertension: Secondary | ICD-10-CM | POA: Diagnosis not present

## 2024-02-24 DIAGNOSIS — N1831 Chronic kidney disease, stage 3a: Secondary | ICD-10-CM | POA: Diagnosis not present

## 2024-02-24 DIAGNOSIS — E785 Hyperlipidemia, unspecified: Secondary | ICD-10-CM | POA: Diagnosis not present

## 2024-02-25 ENCOUNTER — Other Ambulatory Visit: Payer: Self-pay

## 2024-02-25 MED ORDER — ACCU-CHEK GUIDE TEST VI STRP: ORAL_STRIP | 12 refills | Status: AC

## 2024-02-29 DIAGNOSIS — M25561 Pain in right knee: Secondary | ICD-10-CM | POA: Diagnosis not present

## 2024-03-01 DIAGNOSIS — E785 Hyperlipidemia, unspecified: Secondary | ICD-10-CM | POA: Diagnosis not present

## 2024-03-01 DIAGNOSIS — Z0000 Encounter for general adult medical examination without abnormal findings: Secondary | ICD-10-CM | POA: Diagnosis not present

## 2024-03-01 DIAGNOSIS — E1165 Type 2 diabetes mellitus with hyperglycemia: Secondary | ICD-10-CM | POA: Diagnosis not present

## 2024-03-01 DIAGNOSIS — Z23 Encounter for immunization: Secondary | ICD-10-CM | POA: Diagnosis not present

## 2024-03-01 DIAGNOSIS — M109 Gout, unspecified: Secondary | ICD-10-CM | POA: Diagnosis not present

## 2024-03-01 DIAGNOSIS — N1831 Chronic kidney disease, stage 3a: Secondary | ICD-10-CM | POA: Diagnosis not present

## 2024-03-01 DIAGNOSIS — I10 Essential (primary) hypertension: Secondary | ICD-10-CM | POA: Diagnosis not present

## 2024-03-01 DIAGNOSIS — D473 Essential (hemorrhagic) thrombocythemia: Secondary | ICD-10-CM | POA: Diagnosis not present

## 2024-03-02 ENCOUNTER — Inpatient Hospital Stay: Attending: Internal Medicine

## 2024-03-02 ENCOUNTER — Inpatient Hospital Stay: Admitting: Internal Medicine

## 2024-03-02 VITALS — BP 128/67 | HR 72 | Temp 97.7°F | Resp 16 | Ht 60.0 in | Wt 158.0 lb

## 2024-03-02 DIAGNOSIS — D473 Essential (hemorrhagic) thrombocythemia: Secondary | ICD-10-CM | POA: Insufficient documentation

## 2024-03-02 DIAGNOSIS — D75839 Thrombocytosis, unspecified: Secondary | ICD-10-CM | POA: Insufficient documentation

## 2024-03-02 LAB — CBC WITH DIFFERENTIAL (CANCER CENTER ONLY)
Abs Immature Granulocytes: 0.05 K/uL (ref 0.00–0.07)
Basophils Absolute: 0.2 K/uL — ABNORMAL HIGH (ref 0.0–0.1)
Basophils Relative: 2 %
Eosinophils Absolute: 0.2 K/uL (ref 0.0–0.5)
Eosinophils Relative: 2 %
HCT: 42.8 % (ref 36.0–46.0)
Hemoglobin: 13.7 g/dL (ref 12.0–15.0)
Immature Granulocytes: 1 %
Lymphocytes Relative: 16 %
Lymphs Abs: 1.5 K/uL (ref 0.7–4.0)
MCH: 28.6 pg (ref 26.0–34.0)
MCHC: 32 g/dL (ref 30.0–36.0)
MCV: 89.4 fL (ref 80.0–100.0)
Monocytes Absolute: 0.4 K/uL (ref 0.1–1.0)
Monocytes Relative: 4 %
Neutro Abs: 7 K/uL (ref 1.7–7.7)
Neutrophils Relative %: 75 %
Platelet Count: 498 K/uL — ABNORMAL HIGH (ref 150–400)
RBC: 4.79 MIL/uL (ref 3.87–5.11)
RDW: 14.9 % (ref 11.5–15.5)
WBC Count: 9.3 K/uL (ref 4.0–10.5)
nRBC: 0 % (ref 0.0–0.2)

## 2024-03-02 LAB — CMP (CANCER CENTER ONLY)
ALT: 34 U/L (ref 0–44)
AST: 31 U/L (ref 15–41)
Albumin: 4.5 g/dL (ref 3.5–5.0)
Alkaline Phosphatase: 85 U/L (ref 38–126)
Anion gap: 7 (ref 5–15)
BUN: 24 mg/dL — ABNORMAL HIGH (ref 8–23)
CO2: 29 mmol/L (ref 22–32)
Calcium: 9.6 mg/dL (ref 8.9–10.3)
Chloride: 101 mmol/L (ref 98–111)
Creatinine: 1.33 mg/dL — ABNORMAL HIGH (ref 0.44–1.00)
GFR, Estimated: 41 mL/min — ABNORMAL LOW (ref >60)
Glucose, Bld: 125 mg/dL — ABNORMAL HIGH (ref 70–99)
Potassium: 3.9 mmol/L (ref 3.5–5.1)
Sodium: 137 mmol/L (ref 135–145)
Total Bilirubin: 0.7 mg/dL (ref 0.0–1.2)
Total Protein: 9.1 g/dL — ABNORMAL HIGH (ref 6.5–8.1)

## 2024-03-02 LAB — LACTATE DEHYDROGENASE: LDH: 167 U/L (ref 98–192)

## 2024-03-02 NOTE — Progress Notes (Signed)
 [facility] Telephone:(336) [phone]   Fax:(336) [phone]  OFFICE PROGRESS NOTE  Kip Righter, MD [address] [place] [address]  DIAGNOSIS: Essential thrombocythemia diagnosed in March 2022.  PRIOR THERAPY: None  CURRENT THERAPY: Hydroxyurea  500 mg p.o. daily.  Started October 23, 2020.  Her dose was changed to hydroxyurea  500 mg p.o. daily except Monday and Thursday she will take once 1000 mg.  INTERVAL HISTORY: Ann Ann [name] 78 y.o. Ann [name] is to the clinic today for 71-month follow-up visit.  Discussed the use of AI scribe software for clinical note transcription with the patient, who gave verbal consent to proceed.  History of Present Illness Ann Ann [name] is a 78 year old Ann [name] with essential thrombocytopenia who presents for evaluation and review of blood work.  She was diagnosed with essential thrombocytopenia in March 2022 and has been on hydroxyurea  since then. Her platelet count has fluctuated, with recent values including 498 and 367.  Approximately two and a half to three months ago, she pulled her hamstring. She consulted an orthopedic doctor who prescribed pain medication and possibly steroids for inflammation. She has not received any injections yet. She notes improvement in her symptoms and mentions a slight presence of arthritis.  No new complaints or symptoms reported since the last visit.     MEDICAL HISTORY: Past Medical History:  Diagnosis Date   Diabetes mellitus without complication (HCC)    Hypertension     ALLERGIES:  is allergic to hydrocodone and sulfa antibiotics.  MEDICATIONS:  Current Outpatient Medications  Medication Sig Dispense Refill   allopurinol  (ZYLOPRIM ) 100 MG tablet Take 200 mg by mouth at bedtime.      amLODipine  (NORVASC ) 10 MG tablet TAKE 1 TABLET BY MOUTH EVERY DAY 90 tablet 0   atenolol  (TENORMIN ) 50 MG tablet Take 50 mg by mouth daily. Unknown dose     Blood Glucose  Monitoring Suppl (ACCU-CHEK GUIDE) w/Device KIT Use as instructed to check blood sugar 2X daily. 1 kit 0   clonazePAM (KLONOPIN) 0.5 MG tablet Take 0.5 mg by mouth daily as needed.     COLCRYS 0.6 MG tablet Take 0.6 mg by mouth daily as needed (for gout).     empagliflozin  (JARDIANCE ) 10 MG TABS tablet Take 1 tablet (10 mg total) by mouth daily before breakfast. 90 tablet 3   estradiol  (VIVELLE -DOT) 0.075 MG/24HR Place 1 patch onto the skin 2 (two) times a week.     fluticasone (FLONASE) 50 MCG/ACT nasal spray      glimepiride  (AMARYL ) 4 MG tablet TAKE 0.5-1 TABLET BEFORE BREAKFAST AND 1 TABLET BEFORE DINNER 180 tablet 3   glucose blood (ACCU-CHEK GUIDE TEST) test strip Use to check blood sugar twice a day 200 each 12   hydroxyurea  (HYDREA ) 500 MG capsule TAKE 1 CAPSULE (500 MG TOTAL) BY MOUTH DAILY. MAY TAKE WITH FOOD TO MINIMIZE GI SIDE EFFECTS. 90 capsule 1   insulin  glargine (LANTUS  SOLOSTAR) 100 UNIT/ML Solostar Pen Inject 16-18 Units into the skin at bedtime.     Insulin  Pen Needle 32G X 4 MM MISC Use 1x a day 100 each 3   montelukast  (SINGULAIR ) 10 MG tablet Take 10 mg by mouth at bedtime. Unknown dose     olmesartan -hydrochlorothiazide (BENICAR  HCT) 40-12.5 MG tablet Take 1 tablet by mouth daily.     pantoprazole  (PROTONIX ) 20 MG tablet Take 20 mg by mouth daily.     pravastatin  (PRAVACHOL ) 10 MG tablet Take  1 tablet (10 mg total) by mouth daily. 90 tablet 3   Semaglutide , 1 MG/DOSE, (OZEMPIC , 1 MG/DOSE,) 4 MG/3ML SOPN Inject 1 mg into the skin once a week. 9 mL 3   No current facility-administered medications for this visit.    SURGICAL HISTORY:  Past Surgical History:  Procedure Laterality Date   ABDOMINAL HYSTERECTOMY     knee surgery  04/2013    REVIEW OF SYSTEMS:  A comprehensive review of systems was negative except for: Musculoskeletal: positive for arthralgias   PHYSICAL EXAMINATION: General appearance: alert, cooperative, and no distress Head: Normocephalic, without  obvious abnormality, atraumatic Neck: no adenopathy, no JVD, supple, symmetrical, trachea midline, and thyroid  not enlarged, symmetric, no tenderness/mass/nodules Lymph nodes: Cervical, supraclavicular, and axillary nodes normal. Resp: clear to auscultation bilaterally Back: symmetric, no curvature. ROM normal. No CVA tenderness. Cardio: regular rate and rhythm, S1, S2 normal, no murmur, click, rub or gallop GI: soft, non-tender; bowel sounds normal; no masses,  no organomegaly Extremities: extremities normal, atraumatic, no cyanosis or edema  ECOG PERFORMANCE STATUS: 0 - Asymptomatic  Blood pressure 128/67, pulse 72, temperature 97.7 F (36.5 C), temperature source Temporal, resp. rate 16, height 5' (1.524 m), weight 158 lb (71.7 kg), SpO2 100%.  LABORATORY DATA: Lab Results  Component Value Date   WBC 9.3 03/02/2024   HGB 13.7 03/02/2024   HCT 42.8 03/02/2024   MCV 89.4 03/02/2024   PLT 498 (H) 03/02/2024      Chemistry      Component Value Date/Time   NA 138 12/02/2023 0854   K 3.3 (L) 12/02/2023 0854   CL 100 12/02/2023 0854   CO2 29 12/02/2023 0854   BUN 25 (H) 12/02/2023 0854   CREATININE 1.43 (H) 12/02/2023 0854      Component Value Date/Time   CALCIUM 9.0 12/02/2023 0854   ALKPHOS 64 12/02/2023 0854   AST 33 12/02/2023 0854   ALT 36 12/02/2023 0854   BILITOT 0.9 12/02/2023 0854       RADIOGRAPHIC STUDIES: No results found.  ASSESSMENT AND PLAN: This is a very pleasant 78 years old African-American Ann [name] diagnosed with essential thrombocythemia in March 2022 with positive JAK2 mutation. The patient is currently on hydroxyurea  500 mg p.o. daily and has been tolerating it fairly well. Assessment and Plan Assessment & Plan Essential thrombocythemia Chronic condition diagnosed in March 2022. Current platelet count is 498, elevated due to reduced hydroxyurea  dosage. No recent steroid use contributing to elevated platelet counts. Condition requires medication  adjustment to manage platelet levels. - Increase hydroxyurea  to 500 mg once daily, with an additional dose on Mondays and Thursdays. She was advised to call immediately if she has any concerning symptoms in the interval.  The patient voices understanding of current disease status and treatment options and is in agreement with the current care plan.  All questions were answered. The patient knows to call the clinic with any problems, questions or concerns. We can certainly see the patient much sooner if necessary.  Disclaimer: This note was dictated with voice recognition software. Similar sounding words can inadvertently be transcribed and may not be corrected upon review.

## 2024-03-20 DIAGNOSIS — I10 Essential (primary) hypertension: Secondary | ICD-10-CM | POA: Diagnosis not present

## 2024-03-20 DIAGNOSIS — N1831 Chronic kidney disease, stage 3a: Secondary | ICD-10-CM | POA: Diagnosis not present

## 2024-03-20 DIAGNOSIS — E1165 Type 2 diabetes mellitus with hyperglycemia: Secondary | ICD-10-CM | POA: Diagnosis not present

## 2024-03-20 DIAGNOSIS — M25561 Pain in right knee: Secondary | ICD-10-CM | POA: Diagnosis not present

## 2024-03-26 DIAGNOSIS — E1165 Type 2 diabetes mellitus with hyperglycemia: Secondary | ICD-10-CM | POA: Diagnosis not present

## 2024-03-26 DIAGNOSIS — I10 Essential (primary) hypertension: Secondary | ICD-10-CM | POA: Diagnosis not present

## 2024-03-26 DIAGNOSIS — F411 Generalized anxiety disorder: Secondary | ICD-10-CM | POA: Diagnosis not present

## 2024-03-26 DIAGNOSIS — N1831 Chronic kidney disease, stage 3a: Secondary | ICD-10-CM | POA: Diagnosis not present

## 2024-03-26 DIAGNOSIS — E785 Hyperlipidemia, unspecified: Secondary | ICD-10-CM | POA: Diagnosis not present

## 2024-04-18 DIAGNOSIS — M1711 Unilateral primary osteoarthritis, right knee: Secondary | ICD-10-CM | POA: Diagnosis not present

## 2024-04-19 DIAGNOSIS — I10 Essential (primary) hypertension: Secondary | ICD-10-CM | POA: Diagnosis not present

## 2024-04-19 DIAGNOSIS — E1165 Type 2 diabetes mellitus with hyperglycemia: Secondary | ICD-10-CM | POA: Diagnosis not present

## 2024-04-19 DIAGNOSIS — N1831 Chronic kidney disease, stage 3a: Secondary | ICD-10-CM | POA: Diagnosis not present

## 2024-04-24 DIAGNOSIS — Z23 Encounter for immunization: Secondary | ICD-10-CM | POA: Diagnosis not present

## 2024-04-24 DIAGNOSIS — H35363 Drusen (degenerative) of macula, bilateral: Secondary | ICD-10-CM | POA: Diagnosis not present

## 2024-04-24 DIAGNOSIS — H43391 Other vitreous opacities, right eye: Secondary | ICD-10-CM | POA: Diagnosis not present

## 2024-04-24 DIAGNOSIS — H43811 Vitreous degeneration, right eye: Secondary | ICD-10-CM | POA: Diagnosis not present

## 2024-04-24 DIAGNOSIS — H2513 Age-related nuclear cataract, bilateral: Secondary | ICD-10-CM | POA: Diagnosis not present

## 2024-04-24 DIAGNOSIS — H35373 Puckering of macula, bilateral: Secondary | ICD-10-CM | POA: Diagnosis not present

## 2024-04-25 DIAGNOSIS — I10 Essential (primary) hypertension: Secondary | ICD-10-CM | POA: Diagnosis not present

## 2024-04-25 DIAGNOSIS — E1165 Type 2 diabetes mellitus with hyperglycemia: Secondary | ICD-10-CM | POA: Diagnosis not present

## 2024-04-25 DIAGNOSIS — N1831 Chronic kidney disease, stage 3a: Secondary | ICD-10-CM | POA: Diagnosis not present

## 2024-04-25 DIAGNOSIS — F411 Generalized anxiety disorder: Secondary | ICD-10-CM | POA: Diagnosis not present

## 2024-04-25 DIAGNOSIS — E785 Hyperlipidemia, unspecified: Secondary | ICD-10-CM | POA: Diagnosis not present

## 2024-05-04 ENCOUNTER — Other Ambulatory Visit: Payer: Self-pay | Admitting: Internal Medicine

## 2024-05-16 DIAGNOSIS — M25561 Pain in right knee: Secondary | ICD-10-CM | POA: Diagnosis not present

## 2024-05-16 DIAGNOSIS — M1711 Unilateral primary osteoarthritis, right knee: Secondary | ICD-10-CM | POA: Diagnosis not present

## 2024-05-18 ENCOUNTER — Encounter: Payer: Self-pay | Admitting: Internal Medicine

## 2024-05-18 ENCOUNTER — Ambulatory Visit: Admitting: Internal Medicine

## 2024-05-18 VITALS — BP 124/60 | HR 70 | Ht 60.0 in | Wt 159.0 lb

## 2024-05-18 DIAGNOSIS — E1129 Type 2 diabetes mellitus with other diabetic kidney complication: Secondary | ICD-10-CM | POA: Diagnosis not present

## 2024-05-18 DIAGNOSIS — Z794 Long term (current) use of insulin: Secondary | ICD-10-CM

## 2024-05-18 DIAGNOSIS — Z23 Encounter for immunization: Secondary | ICD-10-CM | POA: Diagnosis not present

## 2024-05-18 DIAGNOSIS — R809 Proteinuria, unspecified: Secondary | ICD-10-CM | POA: Diagnosis not present

## 2024-05-18 DIAGNOSIS — Z7984 Long term (current) use of oral hypoglycemic drugs: Secondary | ICD-10-CM

## 2024-05-18 DIAGNOSIS — E785 Hyperlipidemia, unspecified: Secondary | ICD-10-CM

## 2024-05-18 LAB — POCT GLYCOSYLATED HEMOGLOBIN (HGB A1C): Hemoglobin A1C: 6.5 % — AB (ref 4.0–5.6)

## 2024-05-18 MED ORDER — FREESTYLE LIBRE 3 PLUS SENSOR MISC: 1.0000 | 3 refills | Status: AC

## 2024-05-18 NOTE — Progress Notes (Signed)
 Patient ID: [name], female   DOB: 28-Sep-1945, 78 y.o.   MRN: [id number]   HPI: Ann [name] is a 78 y.o.-year-old female, initially referred by Dr. Kip, now returning for f/u for DM2, dx in 2006, insulin -dependent, uncontrolled, with complications (microalbuminuria). Last visit 3 months ago.  Interim history: No increased urination, blurry vision, nausea, chest pain.   She had a yeast infection 01/2024 >> treated.  Reviewed HbA1c levels: Lab Results  Component Value Date   HGBA1C 6.8 (A) 02/03/2024   HGBA1C 8.1 (A) 10/28/2023   HGBA1C 9.4 06/01/2023   HGBA1C 6.7 02/11/2023   HGBA1C 7.9 (A) 10/15/2022   HGBA1C 7.0 (A) 06/15/2022   HGBA1C 6.4 (A) 02/12/2022   HGBA1C 6.3 (A) 10/07/2021   HGBA1C 6.5 (A) 06/09/2021   HGBA1C 7.0 (A) 02/10/2021   HGBA1C 7.1 (A) 10/17/2020   HGBA1C 7.1 (A) 06/13/2020   HGBA1C 6.9 (A) 02/08/2020   HGBA1C 7.3 02/07/2020   HGBA1C 7.1 (A) 10/05/2019  02/25/2016: 11.2% Previously 9.7% Previously 8.9%  Previously on: - Amaryl  >> 2 mg in am and 4 >> actually using 2 mg before D - Trulicity  >> off >> Ozempic  0.5 >> 1 mg weekly - Lantus  10 >> 14 >> 16-18 >> still using 14 units daily  She was on Metformin  1000 mg 2x a day >> diarrhea >> 500 mg daily >> diarrhea >> stopped She refused insulin  initially.  I recommended the following regimen: - Amaryl  2 mg before b'fast and 2 mg dinner >> now 2 mg AFTER dinner >>moved before dinner - Ozempic  1 mg weekly - Lantus  18 units at bedtime  - Jardiance  10 mg daily before b'fast   She now checks CBG 0-1x a day: - am: 122-150s >> 125-142 >> 134-163 >> 109-141 >> 125-130 - 2h after b'fast: n/c>> 206 >>  - before lunch: n/c >> 136 (sick) >> n/c - 2h after lunch: n/c  - before dinner: 150s >> 142-156 >> n/c >> 183 >> n/c - 2h after dinner: 157-166, 209 >> 108, 198, 209 >> 152, 158 >> 130-140s - bedtime: n/c >> 150s-160 >> 152-160s >> n/c  - nighttime: n/c >> 143-179 >> n/c Lowest sugar was   125  >> 109; she has hypoglycemia awareness in the 70s. Highest sugar was 209 >> 158 We tried a Dexcom CGM for her before but she is on hydroxyurea  for essential thrombocytopenia and she had erratic values on it.  Glucometer: Freestyle Lite  Pt's meals are: - Breakfast: cereal, boiled egg, juice - Lunch: sandwich, salad - Dinner: meat + salad, potato - Snacks: 3 She saw nutrition in the past.  + mild CKD, last BUN/creatinine:  Lab Results  Component Value Date   BUN 24 (H) 03/02/2024   Lab Results  Component Value Date   CREATININE 1.33 (H) 03/02/2024   He has an elevated ACR: Lab Results  Component Value Date   MICRALBCREAT 24 02/03/2024   MICRALBCREAT 95 (H) 10/28/2023  03/03/2023: ACR 44.2 02/16/2022: ACR 73.1 02/06/2021: ACR 19.8 02/07/2020: ACR 39.5 05/28/2015: Urine ACR 45.6 On olmesartan .  + HL;last set of lipids: 03/01/2024: 113/168/33/53 No results found for: CHOL, HDL, LDLCALC, LDLDIRECT, TRIG, CHOLHDL She started the Pravastatin  10 mg In 01/2018.  - last eye exam was on 03/06/2024: No DR, + macular degeneration.  She had laser surgery OS.  Dr. Trude Blanch.  - no numbness and tingling in her feet.  Saw podiatry in 05/2021 for melanonychia - considered to be due to the  hydroxyurea .  Last foot exam 02/03/2024.  She has essential thrombocytemia.  Latest TSH was normal: 08/26/2023: TSH 2.34 08/21/2018: TSH 2.26  ROS: + see HPI  I reviewed pt's medications, allergies, PMH, social hx, family hx, and changes were documented in the history of present illness. Otherwise, unchanged from my initial visit note.  Past Medical History:  Diagnosis Date   Diabetes mellitus without complication (HCC)    Hypertension    Past Surgical History:  Procedure Laterality Date   ABDOMINAL HYSTERECTOMY     knee surgery  04/2013   Social History   Social History   Marital status: Married    Spouse name: N/A   Number of children: 3   Occupational History   Retired  Programmer, systems   Social History Main Topics   Smoking status: Never Smoker   Smokeless tobacco: No   Alcohol use No   Drug use: no   Current Outpatient Medications on File Prior to Visit  Medication Sig Dispense Refill   allopurinol  (ZYLOPRIM ) 100 MG tablet Take 200 mg by mouth at bedtime.      amLODipine  (NORVASC ) 10 MG tablet TAKE 1 TABLET BY MOUTH EVERY DAY 90 tablet 0   atenolol  (TENORMIN ) 50 MG tablet Take 50 mg by mouth daily. Unknown dose     Blood Glucose Monitoring Suppl (ACCU-CHEK GUIDE) w/Device KIT Use as instructed to check blood sugar 2X daily. 1 kit 0   clonazePAM (KLONOPIN) 0.5 MG tablet Take 0.5 mg by mouth daily as needed.     COLCRYS 0.6 MG tablet Take 0.6 mg by mouth daily as needed (for gout).     empagliflozin  (JARDIANCE ) 10 MG TABS tablet Take 1 tablet (10 mg total) by mouth daily before breakfast. 90 tablet 3   estradiol  (VIVELLE -DOT) 0.075 MG/24HR Place 1 patch onto the skin 2 (two) times a week.     fluticasone (FLONASE) 50 MCG/ACT nasal spray      glimepiride  (AMARYL ) 4 MG tablet TAKE 0.5-1 TABLET BEFORE BREAKFAST AND 1 TABLET BEFORE DINNER 180 tablet 3   glucose blood (ACCU-CHEK GUIDE TEST) test strip Use to check blood sugar twice a day 200 each 12   hydroxyurea  (HYDREA ) 500 MG capsule TAKE 1 CAPSULE (500 MG TOTAL) BY MOUTH DAILY. MAY TAKE WITH FOOD TO MINIMIZE GI SIDE EFFECTS. 90 capsule 1   insulin  glargine (LANTUS  SOLOSTAR) 100 UNIT/ML Solostar Pen Inject 16-18 Units into the skin at bedtime.     Insulin  Pen Needle 32G X 4 MM MISC Use 1x a day 100 each 3   montelukast  (SINGULAIR ) 10 MG tablet Take 10 mg by mouth at bedtime. Unknown dose     olmesartan -hydrochlorothiazide (BENICAR  HCT) 40-12.5 MG tablet Take 1 tablet by mouth daily.     pantoprazole  (PROTONIX ) 20 MG tablet Take 20 mg by mouth daily.     pravastatin  (PRAVACHOL ) 10 MG tablet Take 1 tablet (10 mg total) by mouth daily. 90 tablet 3   Semaglutide , 1 MG/DOSE, (OZEMPIC , 1 MG/DOSE,) 4 MG/3ML SOPN INJECT  1MG  INTO THE SKIN ONCE A WEEK 9 mL 0   No current facility-administered medications on file prior to visit.   Allergies  Allergen Reactions   Hydrocodone Itching   Sulfa Antibiotics     FH: - Diabetes in mother, sister, brother - Hypertension in mother and sister - Hyperlipidemia in brother  PE: BP 124/60   Pulse 70   Ht 5' (1.524 m)   Wt 159 lb (72.1 kg)   SpO2 97%  BMI 31.05 kg/m  Wt Readings from Last 3 Encounters:  05/18/24 159 lb (72.1 kg)  03/02/24 158 lb (71.7 kg)  02/03/24 158 lb 12.8 oz (72 kg)   Constitutional: normal weight, in NAD Eyes: no exophthalmos ENT: no thyromegaly, no cervical lymphadenopathy Cardiovascular: RRR, No MRG Respiratory: CTA B Musculoskeletal: no deformities Skin: no rashes Neurological: no tremor with outstretched hands  ASSESSMENT: 1. DM2, insulin -dependent, uncontrolled, with complications - microalbuminuria  2. HL  3. MAU  PLAN:  1. Patient with longstanding, uncontrolled, type 2 diabetes, with improvement in control after adding the GLP-1 receptor agonist, but with deteriorating control after she relaxed her diet and stopped checking blood sugars.  HbA1c increased to 9.4%.  She is currently on a regimen of sulfonylurea, basal/bolus insulin  regimen, and weekly GLP-1 receptor agonist.  I did advise that to try to stop Amaryl  after starting Jardiance  but she was still taking this at last visit.  She was taking it at bedtime and I advised her to move it before dinner.  HbA1c at last visit was lower, at 6.8%. -At today's visit, her blood sugars appear to be mostly at goal, without hypo or hyperglycemic exceptions.  For now, advised her to continue the current regimen but I did suggest a CGM.  She would be very open to this and hopes that her insurance covers it.  We downloaded together the [facility] app on her phone and called in a prescription for the libre 3+ to her pharmacy.  I advised her to let me know if this is not covered, in which  case we can try the Dexcom CGM. - I suggested to:  Patient Instructions  Please continue: - Amaryl  2 mg before dinner - Ozempic  1 mg weekly - Lantus  18 units at bedtime  - Jardiance  10 mg daily before b'fast   Check sugars 1x a day, rotating check times.               Please return in 3-4 months with your sugar log.    - we checked her HbA1c: 6.5% (lower) - advised to check sugars at different times of the day - 1x a day, rotating check times - advised for yearly eye exams >> she is UTD - return to clinic in 3-4 months  2. HL - Reviewed latest lipid panel from 03/03/2024: 113/168/33/53 -at goal No results found for: CHOL, HDL, LDLCALC, LDLDIRECT, TRIG, CHOLHDL - Continues pravastatin  10 mg daily without side effects.  3. MAU - Patient with a history of microalbuminuria, with previously fluctuating levels, but the latest level was normal in 01/2024, after addition of Jardiance  - She continues on losartan 100 mg daily and we added Jardiance  10 mg daily.  She had 1 yeast infection with this but not in the last 3 months.  I advised her to let me know if she has further yeast infections, in which case, we may need to use Terconazole.  Lela Fendt, MD PhD [facility]

## 2024-05-18 NOTE — Patient Instructions (Addendum)
 Please continue: - Amaryl  2 mg before dinner - Ozempic  1 mg weekly - Lantus  18 units at bedtime  - Jardiance  10 mg daily before b'fast   Try to start the [facility] 3+ sensor.               Please return in 3-4 months.

## 2024-05-19 DIAGNOSIS — I10 Essential (primary) hypertension: Secondary | ICD-10-CM | POA: Diagnosis not present

## 2024-05-19 DIAGNOSIS — N1831 Chronic kidney disease, stage 3a: Secondary | ICD-10-CM | POA: Diagnosis not present

## 2024-05-19 DIAGNOSIS — E1165 Type 2 diabetes mellitus with hyperglycemia: Secondary | ICD-10-CM | POA: Diagnosis not present

## 2024-05-19 NOTE — Addendum Note (Signed)
 Addended by: CLEOTILDE ROLIN RAMAN on: 05/19/2024 08:03 AM   Modules accepted: Orders

## 2024-05-25 ENCOUNTER — Other Ambulatory Visit: Payer: Self-pay | Admitting: Internal Medicine

## 2024-05-25 DIAGNOSIS — D473 Essential (hemorrhagic) thrombocythemia: Secondary | ICD-10-CM

## 2024-05-26 DIAGNOSIS — E1165 Type 2 diabetes mellitus with hyperglycemia: Secondary | ICD-10-CM | POA: Diagnosis not present

## 2024-05-26 DIAGNOSIS — I10 Essential (primary) hypertension: Secondary | ICD-10-CM | POA: Diagnosis not present

## 2024-05-26 DIAGNOSIS — N1831 Chronic kidney disease, stage 3a: Secondary | ICD-10-CM | POA: Diagnosis not present

## 2024-05-26 DIAGNOSIS — F411 Generalized anxiety disorder: Secondary | ICD-10-CM | POA: Diagnosis not present

## 2024-05-26 DIAGNOSIS — E785 Hyperlipidemia, unspecified: Secondary | ICD-10-CM | POA: Diagnosis not present

## 2024-06-01 ENCOUNTER — Inpatient Hospital Stay: Attending: Internal Medicine

## 2024-06-01 ENCOUNTER — Inpatient Hospital Stay: Admitting: Internal Medicine

## 2024-06-01 VITALS — BP 121/55 | HR 67 | Temp 97.8°F | Resp 17 | Ht 60.0 in | Wt 158.0 lb

## 2024-06-01 DIAGNOSIS — Z7964 Long term (current) use of myelosuppressive agent: Secondary | ICD-10-CM | POA: Insufficient documentation

## 2024-06-01 DIAGNOSIS — D75839 Thrombocytosis, unspecified: Secondary | ICD-10-CM | POA: Diagnosis not present

## 2024-06-01 DIAGNOSIS — D473 Essential (hemorrhagic) thrombocythemia: Secondary | ICD-10-CM

## 2024-06-01 LAB — CMP (CANCER CENTER ONLY)
ALT: 38 U/L (ref 0–44)
AST: 39 U/L (ref 15–41)
Albumin: 4.3 g/dL (ref 3.5–5.0)
Alkaline Phosphatase: 79 U/L (ref 38–126)
Anion gap: 8 (ref 5–15)
BUN: 17 mg/dL (ref 8–23)
CO2: 29 mmol/L (ref 22–32)
Calcium: 9.4 mg/dL (ref 8.9–10.3)
Chloride: 100 mmol/L (ref 98–111)
Creatinine: 1.33 mg/dL — ABNORMAL HIGH (ref 0.44–1.00)
GFR, Estimated: 41 mL/min — ABNORMAL LOW (ref >60)
Glucose, Bld: 132 mg/dL — ABNORMAL HIGH (ref 70–99)
Potassium: 3.7 mmol/L (ref 3.5–5.1)
Sodium: 137 mmol/L (ref 135–145)
Total Bilirubin: 1.1 mg/dL (ref 0.0–1.2)
Total Protein: 9.1 g/dL — ABNORMAL HIGH (ref 6.5–8.1)

## 2024-06-01 LAB — CBC WITH DIFFERENTIAL (CANCER CENTER ONLY)
Abs Immature Granulocytes: 0.06 K/uL (ref 0.00–0.07)
Basophils Absolute: 0.2 K/uL — ABNORMAL HIGH (ref 0.0–0.1)
Basophils Relative: 2 %
Eosinophils Absolute: 0.2 K/uL (ref 0.0–0.5)
Eosinophils Relative: 2 %
HCT: 40.7 % (ref 36.0–46.0)
Hemoglobin: 13.4 g/dL (ref 12.0–15.0)
Immature Granulocytes: 1 %
Lymphocytes Relative: 18 %
Lymphs Abs: 1.9 K/uL (ref 0.7–4.0)
MCH: 29.8 pg (ref 26.0–34.0)
MCHC: 32.9 g/dL (ref 30.0–36.0)
MCV: 90.4 fL (ref 80.0–100.0)
Monocytes Absolute: 0.4 K/uL (ref 0.1–1.0)
Monocytes Relative: 4 %
Neutro Abs: 7.6 K/uL (ref 1.7–7.7)
Neutrophils Relative %: 73 %
Platelet Count: 421 K/uL — ABNORMAL HIGH (ref 150–400)
RBC: 4.5 MIL/uL (ref 3.87–5.11)
RDW: 16.3 % — ABNORMAL HIGH (ref 11.5–15.5)
WBC Count: 10.3 K/uL (ref 4.0–10.5)
nRBC: 0 % (ref 0.0–0.2)

## 2024-06-01 LAB — LACTATE DEHYDROGENASE: LDH: 205 U/L — ABNORMAL HIGH (ref 98–192)

## 2024-06-01 NOTE — Progress Notes (Signed)
 [facility] [phone]   [phone]  OFFICE PROGRESS NOTE  Kip Righter, MD [address] [place] [address]  DIAGNOSIS: Essential thrombocythemia diagnosed in March 2022.  PRIOR THERAPY: None  CURRENT THERAPY: Hydroxyurea  500 mg p.o. daily.  Started October 23, 2020.  Her dose was changed to hydroxyurea  500 mg p.o. daily except Monday and Thursday she will take once 1000 mg.  INTERVAL HISTORY: Ann [name] 78 y.o. female is to the clinic today for 18-month follow-up visit.Discussed the use of AI scribe software for clinical note transcription with the patient, who gave verbal consent to proceed.  History of Present Illness Ann [name] is a 78 year old female with essential thrombocythemia who presents for evaluation and repeat blood work.  She was diagnosed with essential thrombocythemia in March 2022 and is currently managed with hydroxyurea . Her regimen includes 500 mg daily, except on Mondays and Thursdays when she takes 1000 mg. Her recent blood work shows a decrease in platelet count to 421,000 from a previous count of 498,000.  No current symptoms such as chest pain or breathing issues are reported. She recently visited her endocrinologist, and her A1c was reported to be in the 6-point range, indicating stable diabetes management.    MEDICAL HISTORY: Past Medical History:  Diagnosis Date   Diabetes mellitus without complication (HCC)    Hypertension     ALLERGIES:  is allergic to hydrocodone and sulfa antibiotics.  MEDICATIONS:  Current Outpatient Medications  Medication Sig Dispense Refill   allopurinol  (ZYLOPRIM ) 100 MG tablet Take 200 mg by mouth at bedtime.      amLODipine  (NORVASC ) 10 MG tablet TAKE 1 TABLET BY MOUTH EVERY DAY 90 tablet 0   atenolol  (TENORMIN ) 50 MG tablet Take 50 mg by mouth daily. Unknown dose     Blood Glucose Monitoring Suppl (ACCU-CHEK GUIDE) w/Device KIT Use as  instructed to check blood sugar 2X daily. 1 kit 0   clonazePAM (KLONOPIN) 0.5 MG tablet Take 0.5 mg by mouth daily as needed.     COLCRYS 0.6 MG tablet Take 0.6 mg by mouth daily as needed (for gout).     Continuous Glucose Sensor (FREESTYLE LIBRE 3 PLUS SENSOR) MISC 1 each by Does not apply route every 14 (fourteen) days. 6 each 3   empagliflozin  (JARDIANCE ) 10 MG TABS tablet Take 1 tablet (10 mg total) by mouth daily before breakfast. 90 tablet 3   estradiol  (VIVELLE -DOT) 0.075 MG/24HR Place 1 patch onto the skin 2 (two) times a week.     fluticasone (FLONASE) 50 MCG/ACT nasal spray      glimepiride  (AMARYL ) 4 MG tablet TAKE 0.5-1 TABLET BEFORE BREAKFAST AND 1 TABLET BEFORE DINNER 180 tablet 3   glucose blood (ACCU-CHEK GUIDE TEST) test strip Use to check blood sugar twice a day 200 each 12   hydroxyurea  (HYDREA ) 500 MG capsule TAKE 1 CAPSULE (500 MG TOTAL) BY MOUTH DAILY. MAY TAKE WITH FOOD TO MINIMIZE GI SIDE EFFECTS. 90 capsule 1   insulin  glargine (LANTUS  SOLOSTAR) 100 UNIT/ML Solostar Pen Inject 16-18 Units into the skin at bedtime.     Insulin  Pen Needle 32G X 4 MM MISC Use 1x a day 100 each 3   montelukast  (SINGULAIR ) 10 MG tablet Take 10 mg by mouth at bedtime. Unknown dose     olmesartan -hydrochlorothiazide (BENICAR  HCT) 40-12.5 MG tablet Take 1 tablet by mouth daily.     pantoprazole  (PROTONIX ) 20 MG tablet  Take 20 mg by mouth daily.     pravastatin  (PRAVACHOL ) 10 MG tablet Take 1 tablet (10 mg total) by mouth daily. 90 tablet 3   Semaglutide , 1 MG/DOSE, (OZEMPIC , 1 MG/DOSE,) 4 MG/3ML SOPN INJECT 1MG  INTO THE SKIN ONCE A WEEK 9 mL 0   No current facility-administered medications for this visit.    SURGICAL HISTORY:  Past Surgical History:  Procedure Laterality Date   ABDOMINAL HYSTERECTOMY     knee surgery  04/2013    REVIEW OF SYSTEMS:  A comprehensive review of systems was negative.   PHYSICAL EXAMINATION: General appearance: alert, cooperative, and no distress Head:  Normocephalic, without obvious abnormality, atraumatic Neck: no adenopathy, no JVD, supple, symmetrical, trachea midline, and thyroid  not enlarged, symmetric, no tenderness/mass/nodules Lymph nodes: Cervical, supraclavicular, and axillary nodes normal. Resp: clear to auscultation bilaterally Back: symmetric, no curvature. ROM normal. No CVA tenderness. Cardio: regular rate and rhythm, S1, S2 normal, no murmur, click, rub or gallop GI: soft, non-tender; bowel sounds normal; no masses,  no organomegaly Extremities: extremities normal, atraumatic, no cyanosis or edema  ECOG PERFORMANCE STATUS: 0 - Asymptomatic  Blood pressure (!) 121/55, pulse 67, temperature 97.8 F (36.6 C), temperature source Temporal, resp. rate 17, height 5' (1.524 m), weight 158 lb (71.7 kg), SpO2 98%.  LABORATORY DATA: Lab Results  Component Value Date   WBC 10.3 06/01/2024   HGB 13.4 06/01/2024   HCT 40.7 06/01/2024   MCV 90.4 06/01/2024   PLT 421 (H) 06/01/2024      Chemistry      Component Value Date/Time   NA 137 03/02/2024 0911   K 3.9 03/02/2024 0911   CL 101 03/02/2024 0911   CO2 29 03/02/2024 0911   BUN 24 (H) 03/02/2024 0911   CREATININE 1.33 (H) 03/02/2024 0911      Component Value Date/Time   CALCIUM 9.6 03/02/2024 0911   ALKPHOS 85 03/02/2024 0911   AST 31 03/02/2024 0911   ALT 34 03/02/2024 0911   BILITOT 0.7 03/02/2024 0911       RADIOGRAPHIC STUDIES: No results found.  ASSESSMENT AND PLAN: This is a very pleasant 78 years old African-American female diagnosed with essential thrombocythemia in March 2022 with positive JAK2 mutation. The patient is currently on hydroxyurea  500 mg p.o. daily and has been tolerating it fairly well. Repeat CBC today showed platelets count of 421,000. Assessment and Plan Assessment & Plan Essential thrombocythemia Diagnosed in March 2022. Currently managed with hydroxyurea  500 mg daily, with increased doses of 1000 mg on Mondays and Thursdays.  Platelet count has decreased from 498,000 to 421,000, indicating a positive response to treatment. The goal is to maintain platelet count below 400,000, though not strictly necessary. Current treatment regimen is effective. - Continue hydroxyurea  500 mg daily with 1000 mg on Mondays and Thursdays. - Scheduled follow-up appointment in 3 months. - Will repeat blood work at the next appointment. She was advised to call immediately if she has any concerning symptoms in the interval. The patient voices understanding of current disease status and treatment options and is in agreement with the current care plan.  All questions were answered. The patient knows to call the clinic with any problems, questions or concerns. We can certainly see the patient much sooner if necessary.  Disclaimer: This note was dictated with voice recognition software. Similar sounding words can inadvertently be transcribed and may not be corrected upon review.

## 2024-06-18 DIAGNOSIS — I10 Essential (primary) hypertension: Secondary | ICD-10-CM | POA: Diagnosis not present

## 2024-06-18 DIAGNOSIS — E1165 Type 2 diabetes mellitus with hyperglycemia: Secondary | ICD-10-CM | POA: Diagnosis not present

## 2024-06-18 DIAGNOSIS — N1831 Chronic kidney disease, stage 3a: Secondary | ICD-10-CM | POA: Diagnosis not present

## 2024-06-30 ENCOUNTER — Other Ambulatory Visit: Payer: Self-pay | Admitting: Internal Medicine

## 2024-08-16 ENCOUNTER — Other Ambulatory Visit: Payer: Self-pay | Admitting: Internal Medicine

## 2024-08-31 ENCOUNTER — Inpatient Hospital Stay: Admitting: Internal Medicine

## 2024-08-31 ENCOUNTER — Inpatient Hospital Stay: Attending: Internal Medicine

## 2024-08-31 VITALS — BP 133/59 | HR 72 | Temp 97.9°F | Resp 17 | Ht 60.0 in | Wt 160.6 lb

## 2024-08-31 DIAGNOSIS — D473 Essential (hemorrhagic) thrombocythemia: Secondary | ICD-10-CM

## 2024-08-31 LAB — CMP (CANCER CENTER ONLY)
ALT: 28 U/L (ref 0–44)
AST: 27 U/L (ref 15–41)
Albumin: 4.3 g/dL (ref 3.5–5.0)
Alkaline Phosphatase: 98 U/L (ref 38–126)
Anion gap: 10 (ref 5–15)
BUN: 26 mg/dL — ABNORMAL HIGH (ref 8–23)
CO2: 29 mmol/L (ref 22–32)
Calcium: 9.1 mg/dL (ref 8.9–10.3)
Chloride: 100 mmol/L (ref 98–111)
Creatinine: 1.39 mg/dL — ABNORMAL HIGH (ref 0.44–1.00)
GFR, Estimated: 39 mL/min — ABNORMAL LOW
Glucose, Bld: 148 mg/dL — ABNORMAL HIGH (ref 70–99)
Potassium: 3.6 mmol/L (ref 3.5–5.1)
Sodium: 139 mmol/L (ref 135–145)
Total Bilirubin: 0.8 mg/dL (ref 0.0–1.2)
Total Protein: 8.9 g/dL — ABNORMAL HIGH (ref 6.5–8.1)

## 2024-08-31 LAB — CBC WITH DIFFERENTIAL (CANCER CENTER ONLY)
Abs Immature Granulocytes: 0.04 10*3/uL (ref 0.00–0.07)
Basophils Absolute: 0.1 10*3/uL (ref 0.0–0.1)
Basophils Relative: 1 %
Eosinophils Absolute: 0.1 10*3/uL (ref 0.0–0.5)
Eosinophils Relative: 1 %
HCT: 39.1 % (ref 36.0–46.0)
Hemoglobin: 13 g/dL (ref 12.0–15.0)
Immature Granulocytes: 0 %
Lymphocytes Relative: 19 %
Lymphs Abs: 1.9 10*3/uL (ref 0.7–4.0)
MCH: 30.4 pg (ref 26.0–34.0)
MCHC: 33.2 g/dL (ref 30.0–36.0)
MCV: 91.4 fL (ref 80.0–100.0)
Monocytes Absolute: 0.5 10*3/uL (ref 0.1–1.0)
Monocytes Relative: 5 %
Neutro Abs: 7.3 10*3/uL (ref 1.7–7.7)
Neutrophils Relative %: 74 %
Platelet Count: 407 10*3/uL — ABNORMAL HIGH (ref 150–400)
RBC: 4.28 MIL/uL (ref 3.87–5.11)
RDW: 14.7 % (ref 11.5–15.5)
WBC Count: 10 10*3/uL (ref 4.0–10.5)
nRBC: 0 % (ref 0.0–0.2)

## 2024-08-31 LAB — LACTATE DEHYDROGENASE: LDH: 159 U/L (ref 105–235)

## 2024-08-31 NOTE — Progress Notes (Signed)
 "     [facility] [phone]   [phone]) [phone]  OFFICE PROGRESS NOTE  Ann Righter, MD [address] [place] [address]  DIAGNOSIS: Essential thrombocythemia diagnosed in March 2022.  PRIOR THERAPY: None  CURRENT THERAPY: Hydroxyurea  500 mg p.o. daily.  Started October 23, 2020.  Her dose was changed to hydroxyurea  500 mg p.o. daily except Monday and Thursday she will take once 1000 mg.  INTERVAL HISTORY: Ann [name] 79 y.o. female is to the clinic today for 34-month follow-up visit.Discussed the use of AI scribe software for clinical note transcription with the patient, who gave verbal consent to proceed.  History of Present Illness Ann [name] is a 79 year old female with essential thrombocythemia who presents for routine evaluation and laboratory monitoring.  She has a history of JAK2-positive essential thrombocythemia, diagnosed in March 2022, and is currently treated with hydroxyurea  (500 mg daily, 1000 mg on Mondays and Thursdays). She has not experienced adverse effects from hydroxyurea  and tolerates the regimen well. Recent platelet count is 407,000. She reports no chest pain, dyspnea, or other new symptoms. Recent inclement weather has limited her activity, but this has not affected her health status.  Recent laboratory studies show stable chemistry except for a mildly elevated serum creatinine at 1.39. She monitors her blood glucose at home and noted some elevated readings today, but has no acute concerns.      MEDICAL HISTORY: Past Medical History:  Diagnosis Date   Diabetes mellitus without complication (HCC)    Hypertension     ALLERGIES:  is allergic to hydrocodone and sulfa antibiotics.  MEDICATIONS:  Current Outpatient Medications  Medication Sig Dispense Refill   allopurinol (ZYLOPRIM) 100 MG tablet Take 200 mg by mouth at bedtime.      amLODipine  (NORVASC ) 10 MG tablet TAKE 1 TABLET BY MOUTH  EVERY DAY 90 tablet 0   atenolol (TENORMIN) 50 MG tablet Take 50 mg by mouth daily. Unknown dose     Blood Glucose Monitoring Suppl (ACCU-CHEK GUIDE) w/Device KIT Use as instructed to check blood sugar 2X daily. 1 kit 0   clonazePAM (KLONOPIN) 0.5 MG tablet Take 0.5 mg by mouth daily as needed.     COLCRYS 0.6 MG tablet Take 0.6 mg by mouth daily as needed (for gout).     Continuous Glucose Sensor (FREESTYLE LIBRE 3 PLUS SENSOR) MISC 1 each by Does not apply route every 14 (fourteen) days. 6 each 3   empagliflozin  (JARDIANCE ) 10 MG TABS tablet Take 1 tablet (10 mg total) by mouth daily before breakfast. 90 tablet 3   estradiol (VIVELLE-DOT) 0.075 MG/24HR Place 1 patch onto the skin 2 (two) times a week.     fluticasone (FLONASE) 50 MCG/ACT nasal spray      glimepiride  (AMARYL ) 4 MG tablet TAKE 0.5-1 TABLET BEFORE BREAKFAST AND 1 TABLET BEFORE DINNER 180 tablet 3   glucose blood (ACCU-CHEK GUIDE TEST) test strip Use to check blood sugar twice a day 200 each 12   hydroxyurea  (HYDREA ) 500 MG capsule TAKE 1 CAPSULE (500 MG TOTAL) BY MOUTH DAILY. MAY TAKE WITH FOOD TO MINIMIZE GI SIDE EFFECTS. 90 capsule 1   insulin  glargine (LANTUS  SOLOSTAR) 100 UNIT/ML Solostar Pen INJECT 14 UNITS INTO THE SKIN AT BEDTIME. (107 DAYS) 45 mL 11   Insulin  Pen Needle (EMBECTA PEN NEEDLE NANO 2 GEN) 32G X 4 MM MISC USE ONCE A DAY 100 each 12   montelukast (SINGULAIR) 10 MG tablet Take 10  mg by mouth at bedtime. Unknown dose     olmesartan-hydrochlorothiazide (BENICAR HCT) 40-12.5 MG tablet Take 1 tablet by mouth daily.     pantoprazole (PROTONIX) 20 MG tablet Take 20 mg by mouth daily.     pravastatin  (PRAVACHOL ) 10 MG tablet Take 1 tablet (10 mg total) by mouth daily. 90 tablet 3   Semaglutide , 1 MG/DOSE, (OZEMPIC , 1 MG/DOSE,) 4 MG/3ML SOPN INJECT 1MG  INTO THE SKIN ONCE A WEEK 9 mL 0   No current facility-administered medications for this visit.    SURGICAL HISTORY:  Past Surgical History:  Procedure Laterality  Date   ABDOMINAL HYSTERECTOMY     knee surgery  04/2013    REVIEW OF SYSTEMS:  A comprehensive review of systems was negative.   PHYSICAL EXAMINATION: General appearance: alert, cooperative, and no distress Head: Normocephalic, without obvious abnormality, atraumatic Neck: no adenopathy, no JVD, supple, symmetrical, trachea midline, and thyroid  not enlarged, symmetric, no tenderness/mass/nodules Lymph nodes: Cervical, supraclavicular, and axillary nodes normal. Resp: clear to auscultation bilaterally Back: symmetric, no curvature. ROM normal. No CVA tenderness. Cardio: regular rate and rhythm, S1, S2 normal, no murmur, click, rub or gallop GI: soft, non-tender; bowel sounds normal; no masses,  no organomegaly Extremities: extremities normal, atraumatic, no cyanosis or edema  ECOG PERFORMANCE STATUS: 0 - Asymptomatic  Blood pressure (!) 133/59, pulse 72, temperature 97.9 F (36.6 C), temperature source Temporal, resp. rate 17, height 5' (1.524 m), weight 160 lb 9.6 oz (72.8 kg), SpO2 99%.  LABORATORY DATA: Lab Results  Component Value Date   WBC 10.0 08/31/2024   HGB 13.0 08/31/2024   HCT 39.1 08/31/2024   MCV 91.4 08/31/2024   PLT 407 (H) 08/31/2024      Chemistry      Component Value Date/Time   NA 139 08/31/2024 0826   K 3.6 08/31/2024 0826   CL 100 08/31/2024 0826   CO2 29 08/31/2024 0826   BUN 26 (H) 08/31/2024 0826   CREATININE 1.39 (H) 08/31/2024 0826      Component Value Date/Time   CALCIUM 9.1 08/31/2024 0826   ALKPHOS 98 08/31/2024 0826   AST 27 08/31/2024 0826   ALT 28 08/31/2024 0826   BILITOT 0.8 08/31/2024 0826       RADIOGRAPHIC STUDIES: No results found.  ASSESSMENT AND PLAN: This is a very pleasant 79 years old African-American female diagnosed with essential thrombocythemia in March 2022 with positive JAK2 mutation. The patient is currently on hydroxyurea  500 mg p.o. daily and has been tolerating it fairly well. CBC today showed platelets  count of 407,000. Assessment and Plan Assessment & Plan Essential thrombocythemia Chronic myeloproliferative neoplasm diagnosed in March 2022, currently well controlled on hydroxyurea  with platelet count trending downward and approaching target. Prognosis remains favorable with ongoing management. - Continued hydroxyurea  500 mg daily, 1000 mg on Mondays and Thursdays as cytoreductive therapy to control platelet count and reduce risk of thrombosis and hemorrhage. - Reviewed recent laboratory results, including platelet count and chemistry. - Performed physical examination. - Planned repeat blood work and clinical evaluation every 3 months to monitor platelet count and disease status. - Instructed her to contact clinic for any concerning symptoms, including chest pain, dyspnea, or other new symptoms. - Scheduled follow-up visit in three months. He was advised to call immediately if she has any other concerning symptoms in the interval.  The patient voices understanding of current disease status and treatment options and is in agreement with the current care plan.  All questions were  answered. The patient knows to call the clinic with any problems, questions or concerns. We can certainly see the patient much sooner if necessary.  Disclaimer: This note was dictated with voice recognition software. Similar sounding words can inadvertently be transcribed and may not be corrected upon review.       "

## 2024-09-18 ENCOUNTER — Ambulatory Visit: Admitting: Internal Medicine

## 2024-11-30 ENCOUNTER — Inpatient Hospital Stay: Attending: Internal Medicine

## 2024-11-30 ENCOUNTER — Inpatient Hospital Stay: Admitting: Internal Medicine
